# Patient Record
Sex: Male | Born: 1935 | Race: White | Hispanic: No | Marital: Married | Smoking: Former smoker
Health system: Southern US, Community
[De-identification: ages and names within clinical notes are randomized; demographics above are authoritative.]

## PROBLEM LIST (undated history)

## (undated) DIAGNOSIS — I471 Supraventricular tachycardia, unspecified: Secondary | ICD-10-CM

## (undated) DIAGNOSIS — G459 Transient cerebral ischemic attack, unspecified: Secondary | ICD-10-CM

## (undated) DIAGNOSIS — A419 Sepsis, unspecified organism: Secondary | ICD-10-CM

## (undated) DIAGNOSIS — N183 Chronic kidney disease, stage 3 unspecified: Secondary | ICD-10-CM

## (undated) DIAGNOSIS — I319 Disease of pericardium, unspecified: Secondary | ICD-10-CM

## (undated) DIAGNOSIS — C679 Malignant neoplasm of bladder, unspecified: Secondary | ICD-10-CM

## (undated) DIAGNOSIS — R262 Difficulty in walking, not elsewhere classified: Secondary | ICD-10-CM

## (undated) DIAGNOSIS — R0781 Pleurodynia: Secondary | ICD-10-CM

## (undated) DIAGNOSIS — R911 Solitary pulmonary nodule: Secondary | ICD-10-CM

## (undated) DIAGNOSIS — K5090 Crohn's disease, unspecified, without complications: Secondary | ICD-10-CM

## (undated) DIAGNOSIS — J42 Unspecified chronic bronchitis: Secondary | ICD-10-CM

## (undated) DIAGNOSIS — I714 Abdominal aortic aneurysm, without rupture, unspecified: Secondary | ICD-10-CM

## (undated) DIAGNOSIS — M1990 Unspecified osteoarthritis, unspecified site: Secondary | ICD-10-CM

## (undated) DIAGNOSIS — E781 Pure hyperglyceridemia: Secondary | ICD-10-CM

## (undated) DIAGNOSIS — R1310 Dysphagia, unspecified: Secondary | ICD-10-CM

## (undated) DIAGNOSIS — I10 Essential (primary) hypertension: Secondary | ICD-10-CM

## (undated) HISTORY — PX: COLON SURGERY: SHX602

## (undated) HISTORY — PX: APPENDECTOMY: SHX54

## (undated) HISTORY — PX: ABDOMINAL AORTIC ANEURYSM REPAIR: SUR1152

## (undated) HISTORY — PX: CHOLECYSTECTOMY: SHX55

## (undated) HISTORY — PX: RENAL CYST EXCISION: SUR506

## (undated) HISTORY — DX: Disease of pericardium, unspecified: I31.9

## (undated) HISTORY — PX: COCHLEAR IMPLANT: SUR684

## (undated) HISTORY — PX: OTHER SURGICAL HISTORY: SHX169

## (undated) HISTORY — PX: INGUINAL HERNIA REPAIR: SUR1180

---

## 2013-04-13 ENCOUNTER — Emergency Department (HOSPITAL_COMMUNITY): Payer: Medicare Other

## 2013-04-13 ENCOUNTER — Inpatient Hospital Stay (HOSPITAL_COMMUNITY)
Admission: EM | Admit: 2013-04-13 | Discharge: 2013-04-14 | DRG: 287 | Disposition: A | Discharge status: Home / Self Care | Payer: Medicare Other | Attending: Cardiology | Admitting: Cardiology

## 2013-04-13 ENCOUNTER — Encounter (HOSPITAL_COMMUNITY): Payer: Self-pay | Admitting: Emergency Medicine

## 2013-04-13 ENCOUNTER — Encounter (HOSPITAL_COMMUNITY)
Admission: EM | Disposition: A | Discharge status: Home / Self Care | Payer: Self-pay | Source: Home / Self Care | Attending: Cardiology

## 2013-04-13 DIAGNOSIS — I319 Disease of pericardium, unspecified: Secondary | ICD-10-CM

## 2013-04-13 DIAGNOSIS — R0989 Other specified symptoms and signs involving the circulatory and respiratory systems: Secondary | ICD-10-CM | POA: Diagnosis present

## 2013-04-13 DIAGNOSIS — Z8673 Personal history of transient ischemic attack (TIA), and cerebral infarction without residual deficits: Secondary | ICD-10-CM

## 2013-04-13 DIAGNOSIS — I309 Acute pericarditis, unspecified: Principal | ICD-10-CM | POA: Insufficient documentation

## 2013-04-13 DIAGNOSIS — I2510 Atherosclerotic heart disease of native coronary artery without angina pectoris: Secondary | ICD-10-CM | POA: Diagnosis present

## 2013-04-13 DIAGNOSIS — E781 Pure hyperglyceridemia: Secondary | ICD-10-CM

## 2013-04-13 DIAGNOSIS — R0781 Pleurodynia: Secondary | ICD-10-CM | POA: Diagnosis present

## 2013-04-13 DIAGNOSIS — K5090 Crohn's disease, unspecified, without complications: Secondary | ICD-10-CM | POA: Diagnosis present

## 2013-04-13 DIAGNOSIS — Z8551 Personal history of malignant neoplasm of bladder: Secondary | ICD-10-CM

## 2013-04-13 DIAGNOSIS — I300 Acute nonspecific idiopathic pericarditis: Secondary | ICD-10-CM

## 2013-04-13 DIAGNOSIS — I10 Essential (primary) hypertension: Secondary | ICD-10-CM | POA: Diagnosis present

## 2013-04-13 DIAGNOSIS — E785 Hyperlipidemia, unspecified: Secondary | ICD-10-CM | POA: Diagnosis present

## 2013-04-13 DIAGNOSIS — I498 Other specified cardiac arrhythmias: Secondary | ICD-10-CM | POA: Diagnosis present

## 2013-04-13 DIAGNOSIS — Z9889 Other specified postprocedural states: Secondary | ICD-10-CM

## 2013-04-13 DIAGNOSIS — F411 Generalized anxiety disorder: Secondary | ICD-10-CM | POA: Diagnosis present

## 2013-04-13 DIAGNOSIS — R0609 Other forms of dyspnea: Secondary | ICD-10-CM | POA: Diagnosis present

## 2013-04-13 DIAGNOSIS — R079 Chest pain, unspecified: Secondary | ICD-10-CM

## 2013-04-13 HISTORY — DX: Abdominal aortic aneurysm, without rupture, unspecified: I71.40

## 2013-04-13 HISTORY — DX: Transient cerebral ischemic attack, unspecified: G45.9

## 2013-04-13 HISTORY — PX: LEFT HEART CATHETERIZATION WITH CORONARY ANGIOGRAM: SHX5451

## 2013-04-13 HISTORY — DX: Crohn's disease, unspecified, without complications: K50.90

## 2013-04-13 HISTORY — DX: Sepsis, unspecified organism: A41.9

## 2013-04-13 HISTORY — DX: Essential (primary) hypertension: I10

## 2013-04-13 HISTORY — DX: Malignant neoplasm of bladder, unspecified: C67.9

## 2013-04-13 HISTORY — DX: Supraventricular tachycardia: I47.1

## 2013-04-13 HISTORY — DX: Pleurodynia: R07.81

## 2013-04-13 HISTORY — DX: Supraventricular tachycardia, unspecified: I47.10

## 2013-04-13 HISTORY — DX: Disease of pericardium, unspecified: I31.9

## 2013-04-13 HISTORY — DX: Abdominal aortic aneurysm, without rupture: I71.4

## 2013-04-13 HISTORY — DX: Pure hyperglyceridemia: E78.1

## 2013-04-13 HISTORY — DX: Unspecified osteoarthritis, unspecified site: M19.90

## 2013-04-13 LAB — PROTIME-INR
INR: 0.94 (ref 0.00–1.49)
Prothrombin Time: 12.4 seconds (ref 11.6–15.2)

## 2013-04-13 LAB — COMPREHENSIVE METABOLIC PANEL
ALT: 36 U/L (ref 0–53)
AST: 31 U/L (ref 0–37)
Albumin: 3.4 g/dL — ABNORMAL LOW (ref 3.5–5.2)
Alkaline Phosphatase: 58 U/L (ref 39–117)
Calcium: 8.7 mg/dL (ref 8.4–10.5)
GFR calc Af Amer: 54 mL/min — ABNORMAL LOW (ref >90)
GFR calc non Af Amer: 47 mL/min — ABNORMAL LOW (ref >90)
Glucose, Bld: 106 mg/dL — ABNORMAL HIGH (ref 70–99)
Potassium: 4.3 mEq/L (ref 3.5–5.1)
Sodium: 137 mEq/L (ref 135–145)
Total Protein: 6.8 g/dL (ref 6.0–8.3)

## 2013-04-13 LAB — APTT: aPTT: 28 seconds (ref 24–37)

## 2013-04-13 LAB — CBC WITH DIFFERENTIAL/PLATELET
Basophils Absolute: 0 10*3/uL (ref 0.0–0.1)
Basophils Relative: 0 % (ref 0–1)
Eosinophils Absolute: 0.3 10*3/uL (ref 0.0–0.7)
Eosinophils Relative: 4 % (ref 0–5)
Hemoglobin: 14.6 g/dL (ref 13.0–17.0)
Lymphs Abs: 2.1 10*3/uL (ref 0.7–4.0)
MCH: 29.1 pg (ref 26.0–34.0)
MCHC: 33.6 g/dL (ref 30.0–36.0)
MCV: 86.5 fL (ref 78.0–100.0)
Neutrophils Relative %: 55 % (ref 43–77)
Platelets: 121 10*3/uL — ABNORMAL LOW (ref 150–400)
RBC: 5.02 MIL/uL (ref 4.22–5.81)
RDW: 14 % (ref 11.5–15.5)

## 2013-04-13 LAB — CREATININE, SERUM
GFR calc Af Amer: 54 mL/min — ABNORMAL LOW (ref >90)
GFR calc non Af Amer: 46 mL/min — ABNORMAL LOW (ref >90)

## 2013-04-13 LAB — CBC
HCT: 40.9 % (ref 39.0–52.0)
Hemoglobin: 13.8 g/dL (ref 13.0–17.0)
MCH: 29.2 pg (ref 26.0–34.0)
RDW: 13.8 % (ref 11.5–15.5)
WBC: 6 10*3/uL (ref 4.0–10.5)

## 2013-04-13 LAB — TROPONIN I
Troponin I: <0.3 ng/mL (ref <0.30)
Troponin I: <0.3 ng/mL (ref <0.30)
Troponin I: <0.3 ng/mL (ref <0.30)

## 2013-04-13 SURGERY — LEFT HEART CATHETERIZATION WITH CORONARY ANGIOGRAM
Anesthesia: LOCAL | Laterality: Bilateral

## 2013-04-13 MED ORDER — HEPARIN (PORCINE) IN NACL 2-0.9 UNIT/ML-% IJ SOLN
INTRAMUSCULAR | Status: AC
  Filled 2013-04-13: qty 1000

## 2013-04-13 MED ORDER — SODIUM CHLORIDE 0.9 % IV SOLN: INTRAVENOUS | Status: AC

## 2013-04-13 MED ORDER — SODIUM CHLORIDE 0.9 % IJ SOLN
3.0000 mL | Freq: Two times a day (BID) | INTRAMUSCULAR | Status: DC
  Administered 2013-04-13 – 2013-04-14 (×2): 3 mL via INTRAVENOUS

## 2013-04-13 MED ORDER — COLCHICINE 0.6 MG PO TABS
0.6000 mg | ORAL_TABLET | Freq: Two times a day (BID) | ORAL | Status: DC
  Administered 2013-04-13 – 2013-04-14 (×2): 0.6 mg via ORAL
  Filled 2013-04-13 (×3): qty 1

## 2013-04-13 MED ORDER — ATORVASTATIN CALCIUM 40 MG PO TABS
40.0000 mg | ORAL_TABLET | Freq: Every day | ORAL | Status: DC
  Administered 2013-04-13: 40 mg via ORAL
  Filled 2013-04-13 (×2): qty 1

## 2013-04-13 MED ORDER — DIPHENOXYLATE-ATROPINE 2.5-0.025 MG PO TABS: 2.0000 | ORAL_TABLET | Freq: Two times a day (BID) | ORAL | Status: DC | PRN

## 2013-04-13 MED ORDER — LIDOCAINE HCL (PF) 1 % IJ SOLN
INTRAMUSCULAR | Status: AC
  Filled 2013-04-13: qty 30

## 2013-04-13 MED ORDER — IOHEXOL 350 MG/ML SOLN
100.0000 mL | Freq: Once | INTRAVENOUS | Status: AC | PRN
  Administered 2013-04-13: 100 mL via INTRAVENOUS

## 2013-04-13 MED ORDER — ONDANSETRON HCL 4 MG/2ML IJ SOLN: 4.0000 mg | Freq: Four times a day (QID) | INTRAMUSCULAR | Status: DC | PRN

## 2013-04-13 MED ORDER — RISAQUAD PO CAPS
1.0000 | ORAL_CAPSULE | Freq: Two times a day (BID) | ORAL | Status: DC
  Administered 2013-04-13 – 2013-04-14 (×3): 1 via ORAL
  Filled 2013-04-13 (×4): qty 1

## 2013-04-13 MED ORDER — MORPHINE SULFATE 2 MG/ML IJ SOLN
2.0000 mg | Freq: Once | INTRAMUSCULAR | Status: DC
  Filled 2013-04-13: qty 1

## 2013-04-13 MED ORDER — SODIUM CHLORIDE 0.9 % IV SOLN: 250.0000 mL | INTRAVENOUS | Status: DC | PRN

## 2013-04-13 MED ORDER — ASPIRIN 81 MG PO CHEW
81.0000 mg | CHEWABLE_TABLET | Freq: Every day | ORAL | Status: DC
  Administered 2013-04-13 – 2013-04-14 (×2): 81 mg via ORAL
  Filled 2013-04-13: qty 1

## 2013-04-13 MED ORDER — HEPARIN SODIUM (PORCINE) 1000 UNIT/ML IJ SOLN
INTRAMUSCULAR | Status: AC
  Filled 2013-04-13: qty 1

## 2013-04-13 MED ORDER — HEPARIN SODIUM (PORCINE) 5000 UNIT/ML IJ SOLN
4000.0000 [IU] | Freq: Once | INTRAMUSCULAR | Status: AC
  Administered 2013-04-13: 4000 [IU] via INTRAVENOUS
  Filled 2013-04-13: qty 1

## 2013-04-13 MED ORDER — NITROGLYCERIN 0.4 MG SL SUBL: 0.4000 mg | SUBLINGUAL_TABLET | SUBLINGUAL | Status: DC | PRN

## 2013-04-13 MED ORDER — NITROGLYCERIN 2 % TD OINT
1.0000 [in_us] | TOPICAL_OINTMENT | Freq: Once | TRANSDERMAL | Status: AC
  Administered 2013-04-13: 1 [in_us] via TOPICAL
  Filled 2013-04-13: qty 1

## 2013-04-13 MED ORDER — ACETAMINOPHEN 325 MG PO TABS
650.0000 mg | ORAL_TABLET | ORAL | Status: DC | PRN
  Administered 2013-04-13: 650 mg via ORAL

## 2013-04-13 MED ORDER — METOPROLOL SUCCINATE ER 100 MG PO TB24
100.0000 mg | ORAL_TABLET | Freq: Two times a day (BID) | ORAL | Status: DC
  Administered 2013-04-13 – 2013-04-14 (×3): 100 mg via ORAL
  Filled 2013-04-13 (×4): qty 1

## 2013-04-13 MED ORDER — MORPHINE SULFATE 2 MG/ML IJ SOLN
INTRAMUSCULAR | Status: AC
  Filled 2013-04-13: qty 1

## 2013-04-13 MED ORDER — HEPARIN SODIUM (PORCINE) 5000 UNIT/ML IJ SOLN
5000.0000 [IU] | Freq: Three times a day (TID) | INTRAMUSCULAR | Status: DC
  Administered 2013-04-13 – 2013-04-14 (×3): 5000 [IU] via SUBCUTANEOUS
  Filled 2013-04-13 (×6): qty 1

## 2013-04-13 MED ORDER — METOPROLOL TARTRATE 25 MG PO TABS
25.0000 mg | ORAL_TABLET | Freq: Two times a day (BID) | ORAL | Status: DC
  Filled 2013-04-13 (×2): qty 1

## 2013-04-13 MED ORDER — HEPARIN SODIUM (PORCINE) 5000 UNIT/ML IJ SOLN: 5000.0000 [IU] | Freq: Three times a day (TID) | INTRAMUSCULAR | Status: DC

## 2013-04-13 MED ORDER — HYDROCODONE-ACETAMINOPHEN 5-325 MG PO TABS
1.0000 | ORAL_TABLET | Freq: Four times a day (QID) | ORAL | Status: DC
  Administered 2013-04-13 – 2013-04-14 (×3): 1 via ORAL
  Filled 2013-04-13 (×3): qty 1

## 2013-04-13 MED ORDER — ACETAMINOPHEN 325 MG PO TABS
650.0000 mg | ORAL_TABLET | ORAL | Status: DC | PRN
  Filled 2013-04-13: qty 2

## 2013-04-13 MED ORDER — SODIUM CHLORIDE 0.9 % IJ SOLN
10.0000 mL | INTRAMUSCULAR | Status: DC | PRN
  Administered 2013-04-13: 20 mL
  Administered 2013-04-14: 10 mL

## 2013-04-13 MED ORDER — HYDROMORPHONE HCL PF 1 MG/ML IJ SOLN
0.5000 mg | Freq: Once | INTRAMUSCULAR | Status: AC
  Administered 2013-04-13: 0.5 mg via INTRAVENOUS
  Filled 2013-04-13: qty 1

## 2013-04-13 MED ORDER — TRAZODONE HCL 100 MG PO TABS
100.0000 mg | ORAL_TABLET | Freq: Every evening | ORAL | Status: DC | PRN
  Filled 2013-04-13: qty 2

## 2013-04-13 MED ORDER — CULTURELLE PO CAPS: 1.0000 | ORAL_CAPSULE | Freq: Two times a day (BID) | ORAL | Status: DC

## 2013-04-13 MED ORDER — VITAMIN B-12 1000 MCG PO TABS
1000.0000 ug | ORAL_TABLET | Freq: Every day | ORAL | Status: DC
  Administered 2013-04-13 – 2013-04-14 (×2): 1000 ug via ORAL
  Filled 2013-04-13 (×3): qty 1

## 2013-04-13 MED ORDER — NITROGLYCERIN 0.2 MG/ML ON CALL CATH LAB
INTRAVENOUS | Status: AC
  Filled 2013-04-13: qty 1

## 2013-04-13 MED ORDER — SODIUM CHLORIDE 0.9 % IJ SOLN: 3.0000 mL | INTRAMUSCULAR | Status: DC | PRN

## 2013-04-13 MED ORDER — COLCHICINE 0.6 MG PO TABS
0.6000 mg | ORAL_TABLET | Freq: Once | ORAL | Status: AC
  Administered 2013-04-13: 0.6 mg via ORAL
  Filled 2013-04-13: qty 1

## 2013-04-13 NOTE — Progress Notes (Signed)
TR band removed, no bleeding note. Allen test performed and was WNL. VS are stable Will continue to monitor. Raynelle Bring

## 2013-04-13 NOTE — ED Notes (Signed)
IV team paged and returned call- stated, "we will be there as soon as possible." Family and EDP made aware.

## 2013-04-13 NOTE — Progress Notes (Signed)
  Echocardiogram 2D Echocardiogram has been performed.  Cathie Beams 04/13/2013, 12:55 PM

## 2013-04-13 NOTE — ED Notes (Signed)
Gold colored necklace with 3 pendants on it given to daughter Sherrilyn Rist

## 2013-04-13 NOTE — ED Notes (Signed)
Cardiologist at bedside. Code STEMI called.

## 2013-04-13 NOTE — Progress Notes (Signed)
Chaplain responded to code stemi by visiting with pt's wife in cath lab waiting area. Chaplain offered support through caring conversation and empathic listening.  Pt's wife expressed thanks for chaplain's care and support.   04/13/13 0700  Clinical Encounter Type  Visited With Family;Patient not available  Visit Type Spiritual support    Rulon Abide, chaplain, page [phone]

## 2013-04-13 NOTE — ED Notes (Signed)
EMS called to home of Pt. Pt. Awake from sleep with c/o of non radiating cp, c/o nausea denies vomiting. + SOB. Skin warm and dry. Pt. Given ASA 324mg . Nitro. X 2 SL. With minor relief of CP.Rates CP 7/10 after nitro.

## 2013-04-13 NOTE — ED Notes (Signed)
Patient being transported to cath lab by this RN and a 2nd Charity fundraiser. Pt remains on zoll.

## 2013-04-13 NOTE — CV Procedure (Signed)
    Cardiac Catheterization Procedure Note  Name: [place] MRN: [id number] DOB: 22-Feb-1936  Procedure: Left Heart Cath, Selective Coronary Angiography, LV angiography  Indication: 77 yo WM with history of HTN, hyperlipidemia and PAD presents with acute onset dyspnea and anterior pleuritic chest pain. Initial troponin was negative. D-dimer was elevated but CTA chest showed no evidence of PE. Serial Ecgs show subtle ST elevation in the inferior leads. Urgent cardiac cath was recommended.   Procedural Details: The right wrist was prepped, draped, and anesthetized with 1% lidocaine. Using the modified Seldinger technique, a 5 French sheath was introduced into the right radial artery. 3 mg of verapamil was administered through the sheath, weight-based unfractionated heparin was administered intravenously. Standard Judkins catheters were used for selective coronary angiography and left ventriculography. An EZRAD right catheter was used for the RCA.Catheter exchanges were performed over an exchange length guidewire. There were no immediate procedural complications. A TR band was used for radial hemostasis at the completion of the procedure.  The patient was transferred to the post catheterization recovery area for further monitoring.  Procedural Findings: Hemodynamics: AO 151/82 mean 111 mm Hg LV 150/24 mm Hg  Coronary angiography: Coronary dominance: right  Left mainstem: Normal  Left anterior descending (LAD): Heavily calcified in the mid vessel. There is diffuse 40% disease in the mid LAD. The first diagonal is normal.   Left circumflex (LCx):  The LCx gives rise to 2 OM branches. The first OM is large with mild disease up to 20%. The second OM is small with 40% disease in the mid vessel.   Right coronary artery (RCA): Dominant. Tortuous. Diffuse disease in the proximal to mid vessel up to 40%. 20% disease distally.  Left ventriculography: Left ventricular systolic function is normal,  LVEF is estimated at 55-65%, there is no significant mitral regurgitation   Final Conclusions:   1. Nonobstructive CAD 2. Normal LV function.  Recommendations: Admit for further management of chest pain. Will obtain an Echo.  Theron Arista [facility] 04/13/2013, 7:27 AM

## 2013-04-13 NOTE — ED Provider Notes (Signed)
CSN: [id number]     Arrival date & time 04/13/13  5784 History   First MD Initiated Contact with Patient 04/13/13 0257     Chief Complaint  Patient presents with  . Chest Pain   (Consider location/radiation/quality/duration/timing/severity/associated sxs/prior Treatment) HPI Patient presents with acute onset left-sided chest pain, shortness of breath awakened him from sleep. The pain is worse with deep breathing. Took 324 mg of aspirin prior to EMS arrival. When EMS arrived patient had a heart rate in the 140s. Was given 2 nitroglycerin with some relief of the patient's symptoms. Patient denies lower extremity swelling or pain. He's had no recent illnesses. States he wants in his normal state of health when he went to bed. Past Medical History  Diagnosis Date  . Crohn disease   . Anxiety   . Hypertension    Past Surgical History  Procedure Laterality Date  . Colon surgery     No family history on file. History  Substance Use Topics  . Smoking status: Never Smoker   . Smokeless tobacco: Not on file  . Alcohol Use: No    Review of Systems  Constitutional: Negative for fever and chills.  Respiratory: Positive for shortness of breath. Negative for cough and wheezing.   Cardiovascular: Positive for chest pain. Negative for palpitations and leg swelling.  Gastrointestinal: Negative for vomiting, abdominal pain, diarrhea and constipation.  Musculoskeletal: Negative for back pain.  Neurological: Negative for dizziness, syncope, weakness, light-headedness, numbness and headaches.  All other systems reviewed and are negative.    Allergies  Review of patient's allergies indicates not on file.  Home Medications  No current outpatient prescriptions on file. BP 114/79  Pulse 82  Temp(Src) 98.5 F (36.9 C) (Oral)  Resp 14  SpO2 94% Physical Exam  Nursing note and vitals reviewed. Constitutional: He is oriented to person, place, and time. He appears well-developed and  well-nourished. No distress.  HENT:  Head: Normocephalic and atraumatic.  Mouth/Throat: Oropharynx is clear and moist.  Eyes: EOM are normal. Pupils are equal, round, and reactive to light.  Neck: Normal range of motion. Neck supple.  Cardiovascular: Normal rate and regular rhythm.   Pulmonary/Chest: Effort normal. No respiratory distress. He has no wheezes. He has rales. He exhibits tenderness (chest tenderness to palpation in the left upper chest. There is no crepitance or deformity.).  Decreased breath sounds bilateral bases with scattered rhonchi.  Abdominal: Soft. Bowel sounds are normal. He exhibits no distension and no mass. There is no tenderness. There is no rebound and no guarding.  Musculoskeletal: Normal range of motion. He exhibits no edema and no tenderness.  No calf swelling or tenderness  Neurological: He is alert and oriented to person, place, and time.  Skin: Skin is warm and dry. No rash noted. No erythema.  Psychiatric: He has a normal mood and affect. His behavior is normal.    ED Course  Procedures (including critical care time) Labs Review Labs Reviewed  CBC WITH DIFFERENTIAL  COMPREHENSIVE METABOLIC PANEL  PRO B NATRIURETIC PEPTIDE  D-DIMER, QUANTITATIVE  TROPONIN I   Imaging Review No results found.  EKG Interpretation    Date/Time:  Thursday April 13 2013 03:09:26 EST Ventricular Rate:  83 PR Interval:  170 QRS Duration: 88 QT Interval:  363 QTC Calculation: 426 R Axis:   62 Text Interpretation:  Sinus rhythm Abnormal R-wave progression, early transition Confirmed by Josephanthony Tindel  MD, Ramonica Grigg ([id number]) on 04/13/2013 6:10:50 AM  CRITICAL CARE Performed by: Ranae Palms, Chesnee Floren Total critical care time: 60 min Critical care time was exclusive of separately billable procedures and treating other patients. Critical care was necessary to treat or prevent imminent or life-threatening deterioration. Critical care was time spent personally by me on  the following activities: development of treatment plan with patient and/or surrogate as well as nursing, discussions with consultants, evaluation of patient's response to treatment, examination of patient, obtaining history from patient or surrogate, ordering and performing treatments and interventions, ordering and review of laboratory studies, ordering and review of radiographic studies, pulse oximetry and re-evaluation of patient's condition.   MDM  Initial EMS run sheet shows patient to be in a regular narrow complex tachycardia roughly 150-140 beats per minute. It then converts to normal sinus rhythm. Initial EKG at 0309 with mild upsloping of inferior ST segments without definite elevation. No reciprocal changes.  Called and the patient room for increasing numbness and weakness to bilateral upper extremities. He states he is continuing to have chest pain which is much worse when he takes a deep breath in. On the differential is possible dissection versus PE versus coronary artery disease. Patient does have elevated d-dimer and some noted left calf swelling more so than right calf swelling. No bruits in the neck. He has equal pulses bilaterally. Patient is moving all extremities and doesn't appear to have any facial droop. I discussed with Dr. Cherly Hensen about the most appropriate test at this point. I believe the patient likely has a PE and we'll go ahead and get a CT angios study for PE. We'll also get a CT of the patient's head. We will continue to monitor the patient very closely.  CT without acute findings as to cause for patient's symptoms. A repeat EKG with slightly more prominent ST upsloping and mild elevation with questionable changes to AVL. Patient continues to have mild left upper chest pain worse when he takes a deep breath. I discussed with Dr. [place]. Iam awaiting his review of the EKG.  Dr. Sw[place]and reviewed EKG. Concerned about the changes in the last EKG in AVL and slightly more prominent  ST segments in inferior leads. Thinks that we should go ahead and call a STEMI activation. I informed the charge nurse and secretary to activate cath lab.     Loren Racer, MD 04/13/13 [phone]

## 2013-04-13 NOTE — ED Notes (Signed)
Patient remains A&Ox4 at this time. O2 sat 96% on 2L via [place]. Patient states that he continues to have mid chest "pressure" that does not radiate.  All patient belongings were given to patient daughter-Kari.  Patient placed on Zoll- sinus rhythm on monitor.

## 2013-04-13 NOTE — Progress Notes (Signed)
The Chaplain offered emotional and spiritual support to the patient today. The patient expressed to the Chaplain his thankfulness and gratitude to God for sparing his life for so many years while living [place]. The patient told the Chaplain stories on how he grew up and how the importance of family played a big part in his life and still does. He also expressed to the Chaplain that he has a good life and will continue to have one no matter what his physical condition may be in the hospital.  Michael Boston Lavaris Sexson

## 2013-04-13 NOTE — ED Notes (Signed)
Phlebotomy at bedside.

## 2013-04-13 NOTE — ED Notes (Signed)
MD at bedside. 

## 2013-04-13 NOTE — H&P (Signed)
Patient ID: [place] MRN: [id number], DOB/AGE: 11/02/1935   Admit date: 04/13/2013  Primary Physician: Pcp Not In System Primary Cardiologist: new to  - seen by P. [place], MD   Pt. Profile:  77 y/o male w/o prior cardiac hx who presented to the ED this AM with dyspnea, nausea, and pleuritic chest pain with ST elevation.  Problem List  Past Medical History  Diagnosis Date  . Crohn disease     a. s/p multiple bowel resections;  b. s/p portacath.  Marland Kitchen HTN (hypertension)   . Hyperlipidemia   . AAA (abdominal aortic aneurysm)     a. s/p stent grafting in 2012, in [place], [place], per pt.  Marland Kitchen TIA (transient ischemic attack)     a. x 2, last ~ 2002  . Chest pain     a. s/p reportedly nl cath in 2012, [place], [place]  . Bladder cancer     Past Surgical History  Procedure Laterality Date  . Colon surgery      a. multiple bowel resections 2/2 chrohn's dzs.  . Abdominal aortic aneurysm repair      a. reported stenting 2012  . Appendectomy    . Renal cyst excision    . Inguinal hernia repair    . Spinal fusion    . Cochlear implant    . Left shoulder surgery      Allergies  Allergies  Allergen Reactions  . Demerol [Meperidine]   . Morphine And Related    HPI  77 y/o male with the above complex past medical history.  He reports a h/o chest pain about 2 years ago while living in [place], [place], at which time he underwent catheterization that he believes did not show any significant coronary disease.  About 2 wks later, he was readmitted with abdominal pain and after multiple CT's, he was transferred to a larger hospital and underwent what sounds like AAA repair/stent grafting.  He moved to [place] relatively recently.  He was in his usoh until this AM at approximately 2, when he awoke with dyspnea, nausea, disorientation and chest pain that was worse upon taking a deep breath.  He [facility] ED where initial ECG showed subtle inflat ST elevation.  Initial troponin  was nl and d-dimer returned @ 2.39.  Head CT showed no acute findings, while CTA of the chest was neg for PE.  Dyspnea resolved however he continued to have 7/10 pleuritic chest pain.  F/u ecg showed some progression of inflat ST elevation at which point a code STEMI was called.  Pt currently complains of 7/10 pleuritic chest pain but is in no acute distress.  Home Medications  Complete home med list is not available at this time, however he says that he is on blood pressure medicine, cholesterol medicine, and medicines for his crohns.  Family History  Family History  Problem Relation Age of Onset  . CAD Father     died @ 41  . CAD Mother     died @ 16   Social History  History   Social History  . Marital Status: Married    Spouse Name: N/A    Number of Children: N/A  . Years of Education: N/A   Occupational History  . Not on file.   Social History Main Topics  . Smoking status: Never Smoker   . Smokeless tobacco: Not on file  . Alcohol Use: No  . Drug Use: No  . Sexual  Activity: Not on file   Other Topics Concern  . Not on file   Social History Narrative   Lives in [place].  Does not routinely exercise.    Review of Systems General:  No chills, fever, night sweats or weight changes. HOH Cardiovascular:  +++ chest pain, +++ dyspnea on exertion, no edema, orthopnea, palpitations, paroxysmal nocturnal dyspnea. Dermatological: No rash, lesions/masses Respiratory: No cough, +++ dyspnea Urologic: No hematuria, dysuria Abdominal:   No nausea, vomiting, diarrhea, bright red blood per rectum, melena, or hematemesis Neurologic:  No visual changes, wkns, changes in mental status. All other systems reviewed and are otherwise negative except as noted above.  Physical Exam  Blood pressure 122/73, pulse 60, temperature 98.5 F (36.9 C), temperature source Oral, resp. rate 12, SpO2 95.00%.  General: Pleasant, NAD Psych: Normal affect. Neuro: Alert and oriented X 3. Moves  all extremities spontaneously. HEENT: Normal  Neck: Supple without bruits or JVD. Lungs:  Resp regular and unlabored, CTA. Heart: RRR no s3, s4, or murmurs. Abdomen: Soft, non-tender, non-distended, BS + x 4.  Extremities: No clubbing, cyanosis or edema. DP/PT/Radials 2+ and equal bilaterally.  Labs   Recent Labs  04/13/13 0327  TROPONINI <0.30   Lab Results  Component Value Date   WBC 7.0 04/13/2013   HGB 14.6 04/13/2013   HCT 43.4 04/13/2013   MCV 86.5 04/13/2013   PLT 121* 04/13/2013     Recent Labs Lab 04/13/13 0327  NA 137  K 4.3  CL 102  CO2 22  BUN 25*  CREATININE 1.42*  CALCIUM 8.7  PROT 6.8  BILITOT 0.3  ALKPHOS 58  ALT 36  AST 31  GLUCOSE 106*   Lab Results  Component Value Date   DDIMER 2.39* 04/13/2013    Radiology/Studies  Ct Head Wo Contrast  04/13/2013   CLINICAL DATA:  Nausea  EXAM: CT HEAD WITHOUT CONTRAST  IMPRESSION: 1. No acute intracranial process. 2. Remote lacunar infarct within the left basal ganglia. 3. Right cochlear implant.   Electronically Signed   By: Rise Mu M.D.   On: 04/13/2013 06:05   Ct Angio Chest Pe W/cm &/or Wo Cm  04/13/2013   CLINICAL DATA:  Shortness of breath and elevated D-dimer. Nonradiating chest pain and nausea.  EXAM: CT ANGIOGRAPHY CHEST WITH CONTRAST  IMPRESSION: 1. No evidence of pulmonary embolus. 2. Mild bibasilar atelectasis noted; lungs otherwise clear. 3. Focal aneurysmal dilatation of the proximal descending thoracic aorta to 4.0 cm in diameter.   Electronically Signed   By: Roanna Raider M.D.   On: 04/13/2013 06:10   Dg Chest Port 1 View  04/13/2013   CLINICAL DATA:  Chest pain and shortness of breath.  EXAM: PORTABLE CHEST - 1 VIEW    IMPRESSION: Mild left basilar airspace opacity may reflect atelectasis or possibly mild pneumonia.   Electronically Signed   By: Roanna Raider M.D.   On: 04/13/2013 03:47   ECG  Rsr, 61, 1-1.60mm inflat ST elevation.  ASSESSMENT AND PLAN  1.  Chest  Pain with inferolateral ST elevation/Possible acute pericarditis:  Pt presented with pleuritic chest pain and dyspnea with inferolateral ST elevation.  Initial troponin normal, D-dimer elevated however CTA of chest negative for PE.  Cath shows nonobstructive CAD.  Suspect pericarditis.  Will admit to stepdown, add asa and colchicine.  Avoid nsaids with crohn's history.  Cycle CE.  Check Echo.  Add statin given diffuse plaque.  2.  HTN: currently stable.  Will review home meds  and add.  3. HL:  Check lipids/lft's.  Says he was on [place] @ home.  Will add statin given diffuse coronary plaque.  4.  Crohn's dzs:  Resume home meds.  Signed, Nicolasa Ducking, NP 04/13/2013, 7:01 AM Patient seen and examined and history reviewed. Agree with above findings and plan. 77 yo WM with history noted above. Presents with acute onset dyspnea and anterior pleuritic chest pain. D-dimer elevated but CTA of chest negative for PE. Serial Ecgs show subtle ST elevation inferiorly. Urgent cardiac cath performed and showed no significant obstructive CAD. Will admit. Start colchicine for possible pericarditis. Check Echo. I am reluctant to start NSAIDs due to advanced Chron's disease.   Theron Arista [facility] 04/13/2013 11:39 AM

## 2013-04-14 ENCOUNTER — Telehealth: Payer: Self-pay | Admitting: Nurse Practitioner

## 2013-04-14 ENCOUNTER — Encounter (HOSPITAL_COMMUNITY): Payer: Self-pay | Admitting: Nurse Practitioner

## 2013-04-14 DIAGNOSIS — R0781 Pleurodynia: Secondary | ICD-10-CM | POA: Diagnosis present

## 2013-04-14 DIAGNOSIS — I319 Disease of pericardium, unspecified: Secondary | ICD-10-CM

## 2013-04-14 DIAGNOSIS — Z9889 Other specified postprocedural states: Secondary | ICD-10-CM

## 2013-04-14 DIAGNOSIS — Z8673 Personal history of transient ischemic attack (TIA), and cerebral infarction without residual deficits: Secondary | ICD-10-CM

## 2013-04-14 DIAGNOSIS — I10 Essential (primary) hypertension: Secondary | ICD-10-CM | POA: Diagnosis present

## 2013-04-14 DIAGNOSIS — E781 Pure hyperglyceridemia: Secondary | ICD-10-CM

## 2013-04-14 DIAGNOSIS — K5090 Crohn's disease, unspecified, without complications: Secondary | ICD-10-CM | POA: Diagnosis present

## 2013-04-14 LAB — LIPID PANEL
LDL Cholesterol: 51 mg/dL (ref 0–99)
Triglycerides: 400 mg/dL — ABNORMAL HIGH (ref <150)
VLDL: 80 mg/dL — ABNORMAL HIGH (ref 0–40)

## 2013-04-14 MED ORDER — ASPIRIN 81 MG PO CHEW: 81.0000 mg | CHEWABLE_TABLET | Freq: Every day | ORAL | Status: DC

## 2013-04-14 MED ORDER — HEPARIN SOD (PORK) LOCK FLUSH 100 UNIT/ML IV SOLN
500.0000 [IU] | INTRAVENOUS | Status: AC | PRN
  Administered 2013-04-14: 500 [IU]

## 2013-04-14 MED ORDER — COLCHICINE 0.6 MG PO TABS: 0.6000 mg | ORAL_TABLET | Freq: Two times a day (BID) | ORAL | Status: DC

## 2013-04-14 MED ORDER — NITROGLYCERIN 0.4 MG SL SUBL: 0.4000 mg | SUBLINGUAL_TABLET | SUBLINGUAL | Status: AC | PRN

## 2013-04-14 NOTE — Progress Notes (Signed)
CARDIAC REHAB PHASE I   PRE:  Rate/Rhythm: 65 SR    BP: sitting 133/73    SaO2: 95 RA  MODE:  Ambulation: 250 ft   POST:  Rate/Rhythm: 69     BP: sitting 120/92     SaO2: 97 RA  Pt ambulated assist x1 with RW. Has many issues with his legs and has trouble walking. Fairly steady with RW. Pt denied CP or SOB. Feels good. Sts it felt good to walk.  [id number]   Elissa Lovett [place] CES, ACSM 04/14/2013 10:05 AM

## 2013-04-14 NOTE — Progress Notes (Signed)
TELEMETRY: Reviewed telemetry pt in NSR: Filed Vitals:   04/13/13 1259 04/13/13 1356 04/13/13 2100 04/14/13 0500  BP: 144/88 123/58 125/100 147/74  Pulse: 60 64 64 65  Temp:  98.2 F (36.8 C) 98.2 F (36.8 C) 97.7 F (36.5 C)  TempSrc:  Oral Oral Oral  Resp:  16    Height:  5\' 8"  (1.727 m)    Weight:   229 lb 1.6 oz (103.919 kg)   SpO2:  97% 95% 99%    Intake/Output Summary (Last 24 hours) at 04/14/13 0906 Last data filed at 04/14/13 0610  Gross per 24 hour  Intake    840 ml  Output   1200 ml  Net   -360 ml    SUBJECTIVE Feels well today. Still a little sore but pleuritic pain resolved.   LABS: Basic Metabolic Panel:  Recent Labs  16/10/96 0327 04/13/13 1040  NA 137  --   K 4.3  --   CL 102  --   CO2 22  --   GLUCOSE 106*  --   BUN 25*  --   CREATININE 1.42* 1.43*  CALCIUM 8.7  --    Liver Function Tests:  Recent Labs  04/13/13 0327  AST 31  ALT 36  ALKPHOS 58  BILITOT 0.3  PROT 6.8  ALBUMIN 3.4*   No results found for this basename: LIPASE, AMYLASE,  in the last 72 hours CBC:  Recent Labs  04/13/13 0327 04/13/13 1040  WBC 7.0 6.0  NEUTROABS 3.8  --   HGB 14.6 13.8  HCT 43.4 40.9  MCV 86.5 86.7  PLT 121* 101*   Cardiac Enzymes:  Recent Labs  04/13/13 1040 04/13/13 1500 04/13/13 2100  TROPONINI <0.30 <0.30 <0.30   BNP: No components found with this basename: POCBNP,  D-Dimer:  Recent Labs  04/13/13 0327  DDIMER 2.39*   Hemoglobin A1C:  Recent Labs  04/13/13 1040  HGBA1C 5.1   Fasting Lipid Panel:  Recent Labs  04/14/13 0515  CHOL 159  HDL 28*  LDLCALC 51  TRIG 045*  CHOLHDL 5.7   Thyroid Function Tests:  Recent Labs  04/13/13 1040  TSH 1.670    Radiology/Studies:  Ct Head Wo Contrast  04/13/2013   CLINICAL DATA:  Nausea  EXAM: CT HEAD WITHOUT CONTRAST  TECHNIQUE: Contiguous axial images were obtained from the base of the skull through the vertex without intravenous contrast.  COMPARISON:  None.   FINDINGS: Extensive streak artifact from the right cochlear implant is present. A right canal wall down mastoidectomy has been performed cochlear electrode is not well evaluated on this examination.  There is no acute intracranial hemorrhage or large vessel territory infarct. Please note that evaluation of the right cerebral hemisphere somewhat limited due to the cochlear implant.  No mass lesion or midline shift. Prominent calcification noted within the left coronal radiata adjacent to the left lateral ventricle. Ventricles are normal in size without evidence of hydrocephalus. Subcentimeter hypodensity within the left basal ganglia is most compatible with a remote lacunar infarct. There is no extra-axial fluid collection.  Calvarium is intact.  Orbits are within normal limits.  Paranasal sinuses and mastoid air cells are clear.  IMPRESSION: 1. No acute intracranial process. 2. Remote lacunar infarct within the left basal ganglia. 3. Right cochlear implant.   Electronically Signed   By: Rise Mu M.D.   On: 04/13/2013 06:05   Ct Angio Chest Pe W/cm &/or Wo Cm  04/13/2013   CLINICAL DATA:  Shortness of breath and elevated D-dimer. Nonradiating chest pain and nausea.  EXAM: CT ANGIOGRAPHY CHEST WITH CONTRAST  TECHNIQUE: Multidetector CT imaging of the chest was performed using the standard protocol during bolus administration of intravenous contrast. Multiplanar CT image reconstructions including MIPs were obtained to evaluate the vascular anatomy.  CONTRAST:  OMNIPAQUE IOHEXOL 350 MG/ML SOLN  COMPARISON:  Chest radiograph performed earlier today at 3:24 a.m.  FINDINGS: There is no evidence of pulmonary embolus.  Mild bibasilar atelectasis is noted. The lungs are otherwise clear. Evaluation is mildly suboptimal due to motion artifact. There is no evidence of significant focal consolidation, pleural effusion or pneumothorax. No masses are identified; no abnormal focal contrast enhancement is seen.   There is focal aneurysmal dilatation of the proximal descending thoracic aorta to 4.0 cm in diameter. No pericardial effusion is identified. The great vessels are grossly unremarkable in appearance. No mediastinal lymphadenopathy is appreciated. No axillary lymphadenopathy is seen. The visualized portions of the thyroid gland are unremarkable in appearance. A right-sided chest port is noted.  The visualized portions of the liver and spleen are unremarkable.  No acute osseous abnormalities are seen.  Review of the MIP images confirms the above findings.  IMPRESSION: 1. No evidence of pulmonary embolus. 2. Mild bibasilar atelectasis noted; lungs otherwise clear. 3. Focal aneurysmal dilatation of the proximal descending thoracic aorta to 4.0 cm in diameter.   Electronically Signed   By: Roanna Raider M.D.   On: 04/13/2013 06:10   Dg Chest Port 1 View  04/13/2013   CLINICAL DATA:  Chest pain and shortness of breath.  EXAM: PORTABLE CHEST - 1 VIEW  COMPARISON:  None.  FINDINGS: The lungs are well-aerated. Mild left basilar opacity may reflect atelectasis or possibly mild pneumonia. There is no evidence of pleural effusion or pneumothorax.  The cardiomediastinal silhouette is borderline normal in size. A right-sided chest port is noted ending about the mid to distal SVC. No acute osseous abnormalities are seen.  IMPRESSION: Mild left basilar airspace opacity may reflect atelectasis or possibly mild pneumonia.   Electronically Signed   By: Roanna Raider M.D.   On: 04/13/2013 03:47   Echo: *[place]* *[facility]* [address] [place], [place] [address] [phone]  ------------------------------------------------------------ Transthoracic Echocardiography  Patient: Bill Dunn, Bill Dunn MR #: [id number] Study Date: 04/13/2013 Gender: M Age: 77 Height: 174cm Weight: 104.5kg BSA: 2.74m^2 Pt. Status: Room: 3W03C  ATTENDING [place], Zaydee Aina PERFORMING [place], Hospital SONOGRAPHER Cathie Beams Candis Shine cc:  ------------------------------------------------------------ LV EF: 55%  ------------------------------------------------------------ Indications: Pericarditis - acute idiopathic 420.91.  ------------------------------------------------------------ History: PMH: Rule out pericardial effusion. Chest pain.  ------------------------------------------------------------ Study Conclusions  - Left ventricle: The cavity size was normal. Systolic function was normal. The estimated ejection fraction was 55%. Wall motion was normal; there were no regional wall motion abnormalities. - Atrial septum: No defect or patent foramen ovale was identified.  Cardiac Catheterization Procedure Note  Name: Teofilo Lupinacci  MRN: [id number]  DOB: May 22, 1935  Procedure: Left Heart Cath, Selective Coronary Angiography, LV angiography  Indication: 77 yo WM with history of HTN, hyperlipidemia and PAD presents with acute onset dyspnea and anterior pleuritic chest pain. Initial troponin was negative. D-dimer was elevated but CTA chest showed no evidence of PE. Serial Ecgs show subtle ST elevation in the inferior leads. Urgent cardiac cath was recommended.  Procedural Details: The right wrist was prepped, draped, and anesthetized with 1% lidocaine. Using the modified Seldinger technique, a 5 French sheath was introduced  into the right radial artery. 3 mg of verapamil was administered through the sheath, weight-based unfractionated heparin was administered intravenously. Standard Judkins catheters were used for selective coronary angiography and left ventriculography. An EZRAD right catheter was used for the RCA.Catheter exchanges were performed over an exchange length guidewire. There were no immediate procedural complications. A TR band was used for radial hemostasis at the completion of the procedure. The patient was transferred to the post catheterization recovery area for further  monitoring.  Procedural Findings:  Hemodynamics:  AO 151/82 mean 111 mm Hg  LV 150/24 mm Hg  Coronary angiography:  Coronary dominance: right  Left mainstem: Normal  Left anterior descending (LAD): Heavily calcified in the mid vessel. There is diffuse 40% disease in the mid LAD. The first diagonal is normal.  Left circumflex (LCx): The LCx gives rise to 2 OM branches. The first OM is large with mild disease up to 20%. The second OM is small with 40% disease in the mid vessel.  Right coronary artery (RCA): Dominant. Tortuous. Diffuse disease in the proximal to mid vessel up to 40%. 20% disease distally.  Left ventriculography: Left ventricular systolic function is normal, LVEF is estimated at 55-65%, there is no significant mitral regurgitation  Final Conclusions:  1. Nonobstructive CAD  2. Normal LV function.  Recommendations: Admit for further management of chest pain. Will obtain an Echo.  Theron Arista [facility]  04/13/2013, 7:27 AM   PHYSICAL EXAM General: Well developed, well nourished, in no acute distress. Head: Normocephalic, atraumatic, sclera non-icteric, no xanthomas, nares are without discharge. Neck: Negative for carotid bruits. JVD not elevated. Lungs: Clear bilaterally to auscultation without wheezes, rales, or rhonchi. Breathing is unlabored. Heart: RRR S1 S2 without murmurs, rubs, or gallops.  Abdomen: Soft, non-tender, non-distended with normoactive bowel sounds. No hepatomegaly. Extensive surgical scars. Msk:  Strength and tone appears normal for age. Extremities: No clubbing, cyanosis or edema.  Distal pedal pulses are 2+ and equal bilaterally. radial site without hematoma. Neuro: Alert and oriented X 3. Moves all extremities spontaneously. Psych:  Responds to questions appropriately with a normal affect.  ASSESSMENT AND PLAN: 1. Acute pleuritic chest pain. Etiology not clear. CT negative for PE. Cardiac cath with nonobstructive disease. Echo without pericardial  thickening/effusion. No fever. Will emprically treat with Colchicine for 2-3 weeks. Stable for DC today. Follow up in 2-3 weeks.  2. SVT- EMS noted SVT with rate 140 bpm- converted spontaneously. Avoid caffeine. Continue metoprolol.  3. PAD s/p aorto-iliac stent grafting  4. HTN controlled.  5. Crohn's disease  6. Nonobstructive CAD-risk factor modification with statin Rx.  Active Problems:   Acute chest pain   Acute pericarditis    Signed, Shea Swalley [place] MD,FACC

## 2013-04-14 NOTE — Telephone Encounter (Signed)
New problem   14 day TCM 04/28/13 @ 8am per Harlan PA

## 2013-04-14 NOTE — Discharge Summary (Signed)
Discharge Summary   Patient ID: Bill Dunn,  MRN: [id number], DOB/AGE: 75-Sep-1937 75 y.o.  Admit date: 04/13/2013 Discharge date: 04/14/2013  Primary Care Provider: A. Radiontchenko Kathryne Sharper) Primary Cardiologist: P. [place], MD   Discharge Diagnoses Principal Problem:   Pleuritic chest pain  **Non-obstructive CAD by catheterization this admission.  Active Problems:   Suspected Acute Pericarditis  **Empiric colchicine therapy initiated.  **Avoiding NSAIDs 2/2 h/o Crohn's.  **No pericardial effusion or thickening noted on Echo this admission.   SVT  **Present upon EMS arrival, broke spontaneously.   Crohn's disease   HTN (hypertension)   Hypertriglyceridemia   History of AAA (abdominal aortic aneurysm) repair   History of TIA (transient ischemic attack)  Allergies Allergies  Allergen Reactions  . Amoxil [Amoxicillin] Other (See Comments)    REACTION: C-diff  . Augmentin [Amoxicillin-Pot Clavulanate] Other (See Comments)    REACTION: C-Diff  . Demerol [Meperidine] Nausea And Vomiting  . Morphine And Related Nausea And Vomiting   Procedures  CT Head w/o contrast 12.11.2014  IMPRESSION: 1. No acute intracranial process. 2. Remote lacunar infarct within the left basal ganglia. 3. Right cochlear implant. _____________   CTA of the chest with contrast 12.11.2014  IMPRESSION: 1. No evidence of pulmonary embolus. 2. Mild bibasilar atelectasis noted; lungs otherwise clear. 3. Focal aneurysmal dilatation of the proximal descending thoracic aorta to 4.0 cm in diameter. _____________   Cardiac Catheterization 12.11.2014  Procedural Findings: Hemodynamics: AO 151/82 mean 111 mm Hg LV 150/24 mm Hg  Coronary angiography: Coronary dominance: right  Left mainstem: Normal Left anterior descending (LAD): Heavily calcified in the mid vessel. There is diffuse 40% disease in the mid LAD. The first diagonal is normal.   Left circumflex (LCx):  The LCx gives rise  to 2 OM branches. The first OM is large with mild disease up to 20%. The second OM is small with 40% disease in the mid vessel.   Right coronary artery (RCA): Dominant. Tortuous. Diffuse disease in the proximal to mid vessel up to 40%. 20% disease distally. Left ventriculography: Left ventricular systolic function is normal, LVEF is estimated at 55-65%, there is no significant mitral regurgitation   Final Conclusions:   1. Nonobstructive CAD 2. Normal LV function. _____________   2D Echocardiogram 12.11.2014  Study Conclusions  - Left ventricle: The cavity size was normal. Systolic   function was normal. The estimated ejection fraction was   55%. Wall motion was normal; there were no regional wall   motion abnormalities. - Atrial septum: No defect or patent foramen ovale was   identified. _____________  History of Present Illness  77 year old male with prior history of hypertension, hypertriglyceridemia, aortic aneurysm status post stenting, and TIAs. He reports a prior history of chest pain at approximately 2012, at which time he underwent diagnostic catheterization which was apparently nonobstructive. He was in his usual state of health until approximately 2 AM on the morning of admission when he awoke suddenly with dyspnea, nausea, disorientation, and chest discomfort that was worse upon taking a deep breath. EMS was called and the patient was found to be tachycardic with a heart rate of 140. ECG showed SVT. Patient complained of chest pain was treated with nitroglycerin and he converted to sinus rhythm. He was taken to the [facility] where his initial troponin was normal and d-dimer was elevated at 2.39. ECG showed subtle inferolateral ST segment elevation. CT of the head was performed secondary to poor disorientation and this showed no acute findings. In light  of elevated d-dimer, CT angiography of the chest was performed and was negative for pulmonary and was. Pleuritic chest pain and  dyspnea persisted and followup ECG showed some progression of inferolateral ST segment elevation. At that point, a code STEMI was called and the patient was taken to the cardiac catheterization laboratory for emergent diagnostic catheterization.   Hospital Course  Patient underwent diagnostic cardiac catheterization on the morning of December 11, revealing nonobstructive coronary artery disease and normal LV function. It was felt that his symptoms and EKG findings were likely secondary to acute pericarditis and post catheterization, he was initiated on colchicine therapy. A 2-D echocardiogram was performed on December 11, showing normal LV function without any evidence of pericardial thickening or effusion. He ruled out for myocardial infarction and had no further chest discomfort on colchicine therapy. He will be discharged home today in good condition and we have arranged cardiology followup within the next 2 weeks. We will plan to keep him on colchicine for 2-3 weeks.  Discharge Vitals Blood pressure 146/73, pulse 67, temperature 97.7 F (36.5 C), temperature source Oral, resp. rate 16, height 5\' 8"  (1.727 m), weight 229 lb 1.6 oz (103.919 kg), SpO2 99.00%.  Filed Weights   04/13/13 2100  Weight: 229 lb 1.6 oz (103.919 kg)    Labs  CBC  Recent Labs  04/13/13 0327 04/13/13 1040  WBC 7.0 6.0  NEUTROABS 3.8  --   HGB 14.6 13.8  HCT 43.4 40.9  MCV 86.5 86.7  PLT 121* 101*   Basic Metabolic Panel  Recent Labs  04/13/13 0327 04/13/13 1040  NA 137  --   K 4.3  --   CL 102  --   CO2 22  --   GLUCOSE 106*  --   BUN 25*  --   CREATININE 1.42* 1.43*  CALCIUM 8.7  --    Liver Function Tests  Recent Labs  04/13/13 0327  AST 31  ALT 36  ALKPHOS 58  BILITOT 0.3  PROT 6.8  ALBUMIN 3.4*   Cardiac Enzymes  Recent Labs  04/13/13 1040 04/13/13 1500 04/13/13 2100  TROPONINI <0.30 <0.30 <0.30   D-Dimer  Recent Labs  04/13/13 0327  DDIMER 2.39*   Hemoglobin  A1C  Recent Labs  04/13/13 1040  HGBA1C 5.1   Fasting Lipid Panel  Recent Labs  04/14/13 0515  CHOL 159  HDL 28*  LDLCALC 51  TRIG 213*  CHOLHDL 5.7   Thyroid Function Tests  Recent Labs  04/13/13 1040  TSH 1.670   Disposition  Pt is being discharged home today in good condition.  Follow-up Plans & Appointments  Follow-up Information   Follow up with Norma Fredrickson, NP On 04/28/2013. (8:00 AM - Dr. Elvis Coil Nurse Pracitioner)    Specialty:  Nurse Practitioner   Contact information:   [address] [place] [address] [phone]       Follow up with Verl Bangs, MD. (as scheduled.)    Specialty:  Family Medicine   Contact information:   [address] [place] [address] [phone]      Discharge Medications    Medication List         aspirin 81 MG chewable tablet  Chew 1 tablet (81 mg total) by mouth daily.     colchicine 0.6 MG tablet  Take 1 tablet (0.6 mg total) by mouth 2 (two) times daily.     COLESTID 1 G tablet  Generic drug:  colestipol  Take 1  g by mouth 2 (two) times daily.     CULTURELLE PO  Take 1 tablet by mouth 2 (two) times daily.     diphenoxylate-atropine 2.5-0.025 MG per tablet  Commonly known as:  LOMOTIL  Take 2 tablets by mouth 2 (two) times daily as needed for diarrhea or loose stools.     metoprolol succinate 100 MG 24 hr tablet  Commonly known as:  TOPROL-XL  Take 100 mg by mouth 2 (two) times daily. Take with or immediately following a meal.     nitroGLYCERIN 0.4 MG SL tablet  Commonly known as:  NITROSTAT  Place 1 tablet (0.4 mg total) under the tongue every 5 (five) minutes x 3 doses as needed for chest pain.     traZODone 100 MG tablet  Commonly known as:  DESYREL  Take 100-200 mg by mouth at bedtime as needed for sleep.     vitamin B-12 1000 MCG tablet  Commonly known as:  CYANOCOBALAMIN  Take 1,000 mcg by mouth daily.       Outstanding  Labs/Studies  None  Duration of Discharge Encounter   Greater than 30 minutes including physician time.  Signed, Nicolasa Ducking NP 04/14/2013, 11:29 AM

## 2013-04-14 NOTE — Clinical Documentation Improvement (Signed)
THIS DOCUMENT IS NOT A PERMANENT PART OF THE MEDICAL RECORD  Please update your documentation with the medical record to reflect your response to this query. If you need help knowing how to do this please call [phone].  04/14/13   Dear Ward Givens NP,  Per Notes patient with suspected "pericarditis". Please assign Acuity to illustrate the severity of illness and risk of mortality. Thank you.  Possible Clinical Conditions? - Acute pericarditis - other condition (please specify)    You may use possible, probable, or suspect with inpatient documentation. possible, probable, suspected diagnoses MUST be documented at the time of discharge  Reviewed: additional documentation in the medical record  Thank You,  Beverley Fiedler  Clinical Documentation Specialist: [phone] Health Information Management [place]

## 2013-04-17 NOTE — Telephone Encounter (Signed)
Patient contacted regarding discharge from [facility] on 12/12.  Patient understands to follow up with provider Norma Fredrickson, NP on 12/26 at 0800 at [facility]. Patient understands discharge instructions? yes Patient understands medications and regiment? yes Patient understands to bring all medications to this visit? yes  Spoke with patient who states he is "feeling great" and has no current concerns or questions.  I advised patient to call office with questions or concerns prior to appointment on 12/26.  Patient verbalized understanding

## 2013-04-28 ENCOUNTER — Encounter: Payer: Self-pay | Admitting: Nurse Practitioner

## 2013-04-28 ENCOUNTER — Ambulatory Visit (INDEPENDENT_AMBULATORY_CARE_PROVIDER_SITE_OTHER): Payer: Medicare Other | Admitting: Nurse Practitioner

## 2013-04-28 VITALS — BP 180/110 | HR 66 | Ht 68.5 in | Wt 229.8 lb

## 2013-04-28 DIAGNOSIS — G4733 Obstructive sleep apnea (adult) (pediatric): Secondary | ICD-10-CM

## 2013-04-28 DIAGNOSIS — I309 Acute pericarditis, unspecified: Secondary | ICD-10-CM

## 2013-04-28 DIAGNOSIS — I259 Chronic ischemic heart disease, unspecified: Secondary | ICD-10-CM

## 2013-04-28 DIAGNOSIS — I10 Essential (primary) hypertension: Secondary | ICD-10-CM

## 2013-04-28 LAB — BASIC METABOLIC PANEL
BUN: 23 mg/dL (ref 6–23)
CO2: 27 mEq/L (ref 19–32)
Calcium: 9.3 mg/dL (ref 8.4–10.5)
Chloride: 102 mEq/L (ref 96–112)
Creatinine, Ser: 1.5 mg/dL (ref 0.4–1.5)
GFR: 48.46 mL/min — ABNORMAL LOW (ref >60.00)
Glucose, Bld: 90 mg/dL (ref 70–99)
Potassium: 5 mEq/L (ref 3.5–5.1)
Sodium: 138 mEq/L (ref 135–145)

## 2013-04-28 MED ORDER — HYDROCHLOROTHIAZIDE 25 MG PO TABS: 25.0000 mg | ORAL_TABLET | Freq: Every day | ORAL | Status: DC

## 2013-04-28 NOTE — Progress Notes (Addendum)
Bill Dunn Date of Birth: 1936-04-04 Medical Record #[id number]  History of Present Illness: Bill Dunn is seen back today for a post hospital/TOC visit. Seen for Dr. [place]. He has a history of HTN, hypertriglyceridemia, AAA repair with stenting, prior TIA, who most recently presented with acute onset of dyspnea, nausea, disorientation and chest discomfort. EMS called - was found to be tachycardic with a heart rte of 140 and EKG showing SVT. Was given NTG and he converted to NSR. D dimer was elevated. CT of the head showed no acute findings. CTA of the chest was negative for PE. EKG was abnormal and he was subsequently cathed showing nonobstructive CAD and normal LV function. Felt to have acute pericarditis - treated with colchicine. Echo showed normal LV function and no pericardial thickening or effusion. He was to remain on colchicine for 2 to 3 weeks.   Comes back today. Here with his daughter. Doing ok. She has multiple concerns. He is doing ok. No more chest pain. Has chronic diarrhea that is regardless of the colchicine. No palpitations. Not short of breath. Not checking his BP at home. Leaving for [place] on January 15th until April. Likes salt. She notes a lot of snoring and apnea at night - has been trying to talk him into a sleep study. Sees the doctor in [place] that fixed his AAA. Not aware of his CTA results.   Daughter reported that he has been on what sounds like an ACE in the past for his BP but had a cough.  Current Outpatient Prescriptions  Medication Sig Dispense Refill  . allopurinol (ZYLOPRIM) 300 MG tablet Take 300 mg by mouth daily.      . colchicine 0.6 MG tablet Take 1 tablet (0.6 mg total) by mouth 2 (two) times daily.  42 tablet  1  . colestipol (COLESTID) 1 G tablet Take 1 g by mouth 2 (two) times daily.      . diphenoxylate-atropine (LOMOTIL) 2.5-0.025 MG per tablet Take 2 tablets by mouth 2 (two) times daily as needed for diarrhea or loose stools.      .  Erythromycin 2 % ointment Apply topically as needed.      . Lactobacillus Rhamnosus, GG, (CULTURELLE PO) Take 1 tablet by mouth 2 (two) times daily.      . metoprolol succinate (TOPROL-XL) 100 MG 24 hr tablet Take 100 mg by mouth 2 (two) times daily. Take with or immediately following a meal.      . nitroGLYCERIN (NITROSTAT) 0.4 MG SL tablet Place 1 tablet (0.4 mg total) under the tongue every 5 (five) minutes x 3 doses as needed for chest pain.  25 tablet  3  . traZODone (DESYREL) 100 MG tablet Take 100-200 mg by mouth at bedtime as needed for sleep.      . vitamin B-12 (CYANOCOBALAMIN) 1000 MCG tablet Take 1,000 mcg by mouth daily.       No current facility-administered medications for this visit.    Allergies  Allergen Reactions  . Amoxil [Amoxicillin] Other (See Comments)    REACTION: C-diff  . Augmentin [Amoxicillin-Pot Clavulanate] Other (See Comments)    REACTION: C-Diff  . Demerol [Meperidine] Nausea And Vomiting  . Morphine And Related Nausea And Vomiting    Past Medical History  Diagnosis Date  . Crohn disease     a. s/p multiple bowel resections;  b. s/p portacath.  Marland Kitchen HTN (hypertension)   . Hypertriglyceridemia   . AAA (abdominal aortic aneurysm)  a. s/p stent grafting in 2012, in [place], [place], per pt.  Marland Kitchen TIA (transient ischemic attack)     a. x 2, last ~ 2002  . Pleuritic chest pain     a. s/p reportedly nl cath in 2012, [place], [place];  b. 04/2013 Cath: LM nl, LAD 6m, D1 nl, LCX nl, OM1 20, OM2 sm 36m, RCA 40p/m, 20d, EF 55-65%;  c. 04/2013 Echo: EF 55%, no rwma;  d. chest pain improved with colchicine.  . Bladder cancer   . Sepsis ? 2002  . Arthritis   . SVT (supraventricular tachycardia)     a. 04/2013->bb therapy.  . Pericarditis 04/13/2013    Past Surgical History  Procedure Laterality Date  . Colon surgery      a. multiple bowel resections 2/2 chrohn's dzs.  . Abdominal aortic aneurysm repair      a. reported stenting 2012  . Appendectomy      . Renal cyst excision    . Inguinal hernia repair    . Spinal fusion    . Cochlear implant    . Left shoulder surgery    . Cholecystectomy      History  Smoking status  . Former Smoker  . Quit date: 05/05/1991  Smokeless tobacco  . Former User    History  Alcohol Use No    Family History  Problem Relation Age of Onset  . CAD Father     died @ 24  . CAD Mother     died @ 103    Review of Systems: The review of systems is per the HPI.  All other systems were reviewed and are negative.  Physical Exam: BP 180/110  Pulse 66  Ht 5' 8.5" (1.74 m)  Wt 229 lb 12.8 oz (104.237 kg)  BMI 34.43 kg/m2  SpO2 96% BP 160/90 by me.  Patient is very pleasant and in no acute distress. Little hard of hearing. He is obese. Using a cane. Skin is warm and dry. Color is normal.  HEENT is unremarkable. Normocephalic/atraumatic. PERRL. Sclera are nonicteric. Neck is supple. No masses. No JVD. Lungs are clear. Cardiac exam shows a regular rate and rhythm. Abdomen is soft. Extremities are without edema. Gait and ROM are intact. No gross neurologic deficits noted.  LABORATORY DATA: PENDING  EKG pending   Lab Results  Component Value Date   WBC 6.0 04/13/2013   HGB 13.8 04/13/2013   HCT 40.9 04/13/2013   PLT 101* 04/13/2013   GLUCOSE 106* 04/13/2013   CHOL 159 04/14/2013   TRIG 400* 04/14/2013   HDL 28* 04/14/2013   LDLCALC 51 04/14/2013   ALT 36 04/13/2013   AST 31 04/13/2013   NA 137 04/13/2013   K 4.3 04/13/2013   CL 102 04/13/2013   CREATININE 1.43* 04/13/2013   BUN 25* 04/13/2013   CO2 22 04/13/2013   TSH 1.670 04/13/2013   INR 0.94 04/13/2013   HGBA1C 5.1 04/13/2013   Echo Study Conclusions  - Left ventricle: The cavity size was normal. Systolic function was normal. The estimated ejection fraction was 55%. Wall motion was normal; there were no regional wall motion abnormalities. - Atrial septum: No defect or patent foramen ovale was identified.  Coronary  angiography:  Coronary dominance: right  Left mainstem: Normal  Left anterior descending (LAD): Heavily calcified in the mid vessel. There is diffuse 40% disease in the mid LAD. The first diagonal is normal.  Left circumflex (LCx): The LCx gives rise to 2  OM branches. The first OM is large with mild disease up to 20%. The second OM is small with 40% disease in the mid vessel.  Right coronary artery (RCA): Dominant. Tortuous. Diffuse disease in the proximal to mid vessel up to 40%. 20% disease distally.  Left ventriculography: Left ventricular systolic function is normal, LVEF is estimated at 55-65%, there is no significant mitral regurgitation  Final Conclusions:  1. Nonobstructive CAD  2. Normal LV function.  Recommendations: Admit for further management of chest pain. Will obtain an Echo.  Theron Arista [facility]  04/13/2013, 7:27 AM   CTA CHEST IMPRESSION: 1. No evidence of pulmonary embolus. 2. Mild bibasilar atelectasis noted; lungs otherwise clear. 3. Focal aneurysmal dilatation of the proximal descending thoracic aorta to 4.0 cm in diameter.   Electronically Signed By: Roanna Raider M.D. On: 04/13/2013 06:10      CT HEAD IMPRESSION: 1. No acute intracranial process. 2. Remote lacunar infarct within the left basal ganglia. 3. Right cochlear implant.   Electronically Signed By: Rise Mu M.D. On: 04/13/2013 06:05    Assessment / Plan: 1. Acute pericarditis - symptoms seem resolved - recheck EKG today - stop colchicine in one week. EKG today shows sinus rhythm - minimal inferior changes   2. Lone episode of SVT - on beta blocker  3. S/P cardiac cath - nonobstructive CAD noted - would manage medically  4. HTN - BP not controlled - I have added HCTZ 25 mg daily. I have asked him to cut back on his salt. Try to monitor BP at home and keep a diary. See before he leaves for [place].   5. HLD  6. Noted dilation of the descending thoracic aorta - probably needs  repeat study in 6 months. Needs BP control.   7. Probably OSA - will try to get a sleep study arranged with Dr. Mayford Knife.   Patient is agreeable to this plan and will call if any problems develop in the interim.   Rosalio Macadamia, RN, ANP-C [facility] [address] [place], [place]  [address]

## 2013-04-28 NOTE — Patient Instructions (Addendum)
Stay on your current medicines but you may stop your Colchicine after next Friday.   I am adding HCTZ 25 mg daily for your BP  We will check labs today  I will see you in 2 to 3 weeks  Check some blood pressures at home and keep a record  We will arrange for a sleep study (split study) with Dr. Mayford Knife  Cut back on your salt  Call the [facility] office at [phone] if you have any questions, problems or concerns.

## 2013-05-13 DIAGNOSIS — I259 Chronic ischemic heart disease, unspecified: Secondary | ICD-10-CM

## 2013-05-13 DIAGNOSIS — I10 Essential (primary) hypertension: Secondary | ICD-10-CM

## 2013-05-13 DIAGNOSIS — G4733 Obstructive sleep apnea (adult) (pediatric): Secondary | ICD-10-CM

## 2013-05-16 ENCOUNTER — Ambulatory Visit (INDEPENDENT_AMBULATORY_CARE_PROVIDER_SITE_OTHER): Payer: Medicare Other | Admitting: Nurse Practitioner

## 2013-05-16 ENCOUNTER — Encounter: Payer: Self-pay | Admitting: Nurse Practitioner

## 2013-05-16 VITALS — BP 140/90 | HR 65 | Ht 68.5 in | Wt 230.4 lb

## 2013-05-16 DIAGNOSIS — I10 Essential (primary) hypertension: Secondary | ICD-10-CM

## 2013-05-16 DIAGNOSIS — I309 Acute pericarditis, unspecified: Secondary | ICD-10-CM

## 2013-05-16 LAB — BASIC METABOLIC PANEL
BUN: 23 mg/dL (ref 6–23)
CO2: 27 mEq/L (ref 19–32)
Calcium: 9 mg/dL (ref 8.4–10.5)
Chloride: 104 mEq/L (ref 96–112)
Creatinine, Ser: 1.6 mg/dL — ABNORMAL HIGH (ref 0.4–1.5)
GFR: 44.97 mL/min — ABNORMAL LOW (ref >60.00)
Glucose, Bld: 114 mg/dL — ABNORMAL HIGH (ref 70–99)
Potassium: 4.8 mEq/L (ref 3.5–5.1)
Sodium: 140 mEq/L (ref 135–145)

## 2013-05-16 NOTE — Progress Notes (Signed)
Bill Dunn Date of Birth: Nov 27, 1935 Medical Record #[id number]  History of Present Illness: [name] is seen back today for a follow up visit. Seen for Dr. Martinique. He has a history of HTN, hypertriglyceridemia, AAA repair with stenting, prior TIA, who most recently presented with acute onset of dyspnea, nausea, disorientation and chest discomfort. EMS called - was found to be tachycardic with a heart rte of 140 and EKG showing SVT. Was given NTG and he converted to NSR. D dimer was elevated. CT of the head showed no acute findings. CTA of the chest was negative for PE. EKG was abnormal and he was subsequently cathed showing nonobstructive CAD and normal LV function. Felt to have acute pericarditis - treated with colchicine. Echo showed normal LV function and no pericardial thickening or effusion. He was to remain on colchicine for 2 to 3 weeks.   Seen 2 weeks ago - daughter had several concerns but overall, he was felt to be doing ok. No more chest pain. Had chronic diarrhea that is regardless of the colchicine. No palpitations. Not short of breath. Was not checking his BP at home. Leaving for [place] on January 15th until April. Likes salt. She noted a lot of snoring and apnea at night - had been trying to talk him into a sleep study. Sees the doctor in [place] that fixed his AAA. Not aware of his CTA results. We added low dose HCTZ and wanted to see him back prior to his leaving for [place] for the winter.   Comes back today. Here with his daughter. He is doing well. No real complaint. Finished his colchicine. No chest pain. Not short of breath. Tried to get his sleep study last night but this did not work out. BP has come down. Seeing his PCP later this week in [place]. Noted to have microalbuminuria - not on ACE/ARB. Probable cough on ACE in the past.   Current Outpatient Prescriptions  Medication Sig Dispense Refill  . colestipol (COLESTID) 1 G tablet Take 1 g by mouth 2 (two) times daily.       . diphenoxylate-atropine (LOMOTIL) 2.5-0.025 MG per tablet Take 2 tablets by mouth 2 (two) times daily as needed for diarrhea or loose stools.      . Erythromycin 2 % ointment Apply topically as needed.      . hydrochlorothiazide (HYDRODIURIL) 25 MG tablet Take 1 tablet (25 mg total) by mouth daily.  30 tablet  6  . Lactobacillus Rhamnosus, GG, (CULTURELLE PO) Take 1 tablet by mouth 2 (two) times daily.      . metoprolol succinate (TOPROL-XL) 100 MG 24 hr tablet Take 100 mg by mouth 2 (two) times daily. Take with or immediately following a meal.      . nitroGLYCERIN (NITROSTAT) 0.4 MG SL tablet Place 1 tablet (0.4 mg total) under the tongue every 5 (five) minutes x 3 doses as needed for chest pain.  25 tablet  3  . traZODone (DESYREL) 100 MG tablet Take 100-200 mg by mouth at bedtime as needed for sleep.      . vitamin B-12 (CYANOCOBALAMIN) 1000 MCG tablet Take 1,000 mcg by mouth daily.       No current facility-administered medications for this visit.    Allergies  Allergen Reactions  . Ace Inhibitors Cough  . Amoxil [Amoxicillin] Other (See Comments)    REACTION: C-diff  . Augmentin [Amoxicillin-Pot Clavulanate] Other (See Comments)    REACTION: C-Diff  . Demerol [Meperidine] Nausea And Vomiting  .  Morphine And Related Nausea And Vomiting    Past Medical History  Diagnosis Date  . Crohn disease     a. s/p multiple bowel resections;  b. s/p portacath.  Bill Dunn HTN (hypertension)   . Hypertriglyceridemia   . AAA (abdominal aortic aneurysm)     a. s/p stent grafting in 2012, in [place], [place], per pt.  Bill Dunn TIA (transient ischemic attack)     a. x 2, last ~ 2002  . Pleuritic chest pain     a. s/p reportedly nl cath in 2012, [place], [place];  b. 04/2013 Cath: LM nl, LAD 37m, D1 nl, LCX nl, OM1 20, OM2 sm 66m, RCA 40p/m, 20d, EF 55-65%;  c. 04/2013 Echo: EF 55%, no rwma;  d. chest pain improved with colchicine.  . Bladder cancer   . Sepsis ? 2002  . Arthritis   . SVT  (supraventricular tachycardia)     a. 04/2013->bb therapy.  . Pericarditis 04/13/2013    Past Surgical History  Procedure Laterality Date  . Colon surgery      a. multiple bowel resections 2/2 chrohn's dzs.  . Abdominal aortic aneurysm repair      a. reported stenting 2012  . Appendectomy    . Renal cyst excision    . Inguinal hernia repair    . Spinal fusion    . Cochlear implant    . Left shoulder surgery    . Cholecystectomy      History  Smoking status  . Former Smoker  . Quit date: 05/05/1991  Smokeless tobacco  . Former User    History  Alcohol Use No    Family History  Problem Relation Age of Onset  . CAD Father     died @ 22  . CAD Mother     died @ 13    Review of Systems: The review of systems is per the HPI.  All other systems were reviewed and are negative.  Physical Exam: BP 140/90  Pulse 65  Ht 5' 8.5" (1.74 m)  Wt 230 lb 6.4 oz (104.509 kg)  BMI 34.52 kg/m2  SpO2 94% Patient is very pleasant and in no acute distress. Little hard of hearing. Skin is warm and dry. Color is normal.  HEENT is unremarkable. Normocephalic/atraumatic. PERRL. Sclera are nonicteric. Neck is supple. No masses. No JVD. Lungs are clear. Cardiac exam shows a regular rate and rhythm. Abdomen is soft. Extremities are without edema. Gait and ROM are intact. No gross neurologic deficits noted.  Wt Readings from Last 3 Encounters:  05/16/13 230 lb 6.4 oz (104.509 kg)  04/28/13 229 lb 12.8 oz (104.237 kg)  04/13/13 229 lb 1.6 oz (103.919 kg)     LABORATORY DATA: BMET is pending  Lab Results  Component Value Date   WBC 6.0 04/13/2013   HGB 13.8 04/13/2013   HCT 40.9 04/13/2013   PLT 101* 04/13/2013   GLUCOSE 90 04/28/2013   CHOL 159 04/14/2013   TRIG 400* 04/14/2013   HDL 28* 04/14/2013   LDLCALC 51 04/14/2013   ALT 36 04/13/2013   AST 31 04/13/2013   NA 138 04/28/2013   K 5.0 04/28/2013   CL 102 04/28/2013   CREATININE 1.5 04/28/2013   BUN 23 04/28/2013   CO2  27 04/28/2013   TSH 1.670 04/13/2013   INR 0.94 04/13/2013   HGBA1C 5.1 04/13/2013   Echo Study Conclusions  - Left ventricle: The cavity size was normal. Systolic function was normal. The estimated ejection  fraction was 55%. Wall motion was normal; there were no regional wall motion abnormalities. - Atrial septum: No defect or patent foramen ovale was identified.  Coronary angiography:  Coronary dominance: right  Left mainstem: Normal  Left anterior descending (LAD): Heavily calcified in the mid vessel. There is diffuse 40% disease in the mid LAD. The first diagonal is normal.  Left circumflex (LCx): The LCx gives rise to 2 OM branches. The first OM is large with mild disease up to 20%. The second OM is small with 40% disease in the mid vessel.  Right coronary artery (RCA): Dominant. Tortuous. Diffuse disease in the proximal to mid vessel up to 40%. 20% disease distally.  Left ventriculography: Left ventricular systolic function is normal, LVEF is estimated at 55-65%, there is no significant mitral regurgitation  Final Conclusions:  1. Nonobstructive CAD  2. Normal LV function.  Recommendations: Admit for further management of chest pain. Will obtain an Echo.  Collier Salina [facility]  04/13/2013, 7:27 AM    CTA CHEST IMPRESSION: 1. No evidence of pulmonary embolus. 2. Mild bibasilar atelectasis noted; lungs otherwise clear. 3. Focal aneurysmal dilatation of the proximal descending thoracic aorta to 4.0 cm in diameter.   Electronically Signed By: Garald Balding M.D. On: 04/13/2013 06:10      CT HEAD IMPRESSION: 1. No acute intracranial process. 2. Remote lacunar infarct within the left basal ganglia. 3. Right cochlear implant.   Electronically Signed By: Jeannine Boga M.D. On: 04/13/2013 06:05  Assessment / Plan:  1. Acute pericarditis - resolved.   2. Lone episode of SVT - on beta blocker - no recurrence  3. S/P cardiac cath - nonobstructive CAD noted - to  manage medically   4. HTN - now on HCTZ 25 mg daily. BP has improved.   5. HLD   6. Noted dilation of the descending thoracic aorta - probably needs repeat study in 6 months. Needs good BP control.   7. Probably OSA - unable to get the sleep study due to problems on our end  8. Microalbuminuria - probably could benefit from ARB therapy - will defer to his PCP once he gets to [place].  We will see him back in May. Check BMET today. No change in current regimen.  Patient is agreeable to this plan and will call if any problems develop in the interim.   Burtis Junes, RN, [place]  [address] [place]  [place], [place] [address]

## 2013-05-16 NOTE — Patient Instructions (Addendum)
Stay on your current medicines  See your regular doctor in [place] as planned  We will check follow up lab today to look at your kidney function/potassium  Dr. Martinique will see you back in May  Call the [place] office at [phone] if you have any questions, problems or concerns.

## 2014-04-12 ENCOUNTER — Encounter (HOSPITAL_COMMUNITY): Payer: Self-pay | Admitting: Cardiology

## 2014-04-27 ENCOUNTER — Encounter (HOSPITAL_COMMUNITY): Payer: Self-pay | Admitting: *Deleted

## 2014-04-27 ENCOUNTER — Observation Stay (HOSPITAL_COMMUNITY)
Admission: EM | Admit: 2014-04-27 | Discharge: 2014-04-28 | Disposition: A | Discharge status: Home / Self Care | Payer: Medicare Other | Attending: Cardiovascular Disease | Admitting: Cardiovascular Disease

## 2014-04-27 ENCOUNTER — Emergency Department (HOSPITAL_COMMUNITY): Payer: Medicare Other

## 2014-04-27 DIAGNOSIS — R079 Chest pain, unspecified: Principal | ICD-10-CM | POA: Diagnosis present

## 2014-04-27 DIAGNOSIS — R7989 Other specified abnormal findings of blood chemistry: Secondary | ICD-10-CM

## 2014-04-27 DIAGNOSIS — E781 Pure hyperglyceridemia: Secondary | ICD-10-CM | POA: Diagnosis not present

## 2014-04-27 DIAGNOSIS — Z8673 Personal history of transient ischemic attack (TIA), and cerebral infarction without residual deficits: Secondary | ICD-10-CM | POA: Insufficient documentation

## 2014-04-27 DIAGNOSIS — Z7982 Long term (current) use of aspirin: Secondary | ICD-10-CM | POA: Diagnosis not present

## 2014-04-27 DIAGNOSIS — I10 Essential (primary) hypertension: Secondary | ICD-10-CM | POA: Diagnosis present

## 2014-04-27 DIAGNOSIS — I472 Ventricular tachycardia: Secondary | ICD-10-CM | POA: Insufficient documentation

## 2014-04-27 DIAGNOSIS — I2510 Atherosclerotic heart disease of native coronary artery without angina pectoris: Secondary | ICD-10-CM | POA: Diagnosis not present

## 2014-04-27 DIAGNOSIS — K5090 Crohn's disease, unspecified, without complications: Secondary | ICD-10-CM | POA: Diagnosis present

## 2014-04-27 DIAGNOSIS — Z79899 Other long term (current) drug therapy: Secondary | ICD-10-CM | POA: Insufficient documentation

## 2014-04-27 DIAGNOSIS — Z8551 Personal history of malignant neoplasm of bladder: Secondary | ICD-10-CM | POA: Diagnosis not present

## 2014-04-27 DIAGNOSIS — Z9889 Other specified postprocedural states: Secondary | ICD-10-CM

## 2014-04-27 DIAGNOSIS — I4729 Other ventricular tachycardia: Secondary | ICD-10-CM

## 2014-04-27 DIAGNOSIS — I739 Peripheral vascular disease, unspecified: Secondary | ICD-10-CM | POA: Insufficient documentation

## 2014-04-27 DIAGNOSIS — R0789 Other chest pain: Secondary | ICD-10-CM

## 2014-04-27 LAB — CBC WITH DIFFERENTIAL/PLATELET
Basophils Absolute: 0.1 10*3/uL (ref 0.0–0.1)
Basophils Relative: 1 % (ref 0–1)
Eosinophils Absolute: 0.2 10*3/uL (ref 0.0–0.7)
Eosinophils Relative: 3 % (ref 0–5)
HCT: 42.8 % (ref 39.0–52.0)
HEMOGLOBIN: 13.6 g/dL (ref 13.0–17.0)
LYMPHS ABS: 2.2 10*3/uL (ref 0.7–4.0)
LYMPHS PCT: 28 % (ref 12–46)
MCH: 27.3 pg (ref 26.0–34.0)
MCHC: 31.8 g/dL (ref 30.0–36.0)
MCV: 85.9 fL (ref 78.0–100.0)
MONOS PCT: 9 % (ref 3–12)
Monocytes Absolute: 0.8 10*3/uL (ref 0.1–1.0)
NEUTROS ABS: 4.8 10*3/uL (ref 1.7–7.7)
NEUTROS PCT: 59 % (ref 43–77)
PLATELETS: 143 10*3/uL — AB (ref 150–400)
RBC: 4.98 MIL/uL (ref 4.22–5.81)
RDW: 14.3 % (ref 11.5–15.5)
WBC: 8.1 10*3/uL (ref 4.0–10.5)

## 2014-04-27 LAB — TROPONIN I
Troponin I: <0.03 ng/mL (ref <0.031)
Troponin I: 0.03 ng/mL (ref <0.031)
Troponin I: <0.03 ng/mL (ref <0.031)

## 2014-04-27 LAB — BASIC METABOLIC PANEL
ANION GAP: 10 (ref 5–15)
BUN: 31 mg/dL — ABNORMAL HIGH (ref 6–23)
CALCIUM: 8.6 mg/dL (ref 8.4–10.5)
CO2: 22 mmol/L (ref 19–32)
CREATININE: 1.47 mg/dL — AB (ref 0.50–1.35)
Chloride: 110 mEq/L (ref 96–112)
GFR calc non Af Amer: 45 mL/min — ABNORMAL LOW (ref >90)
GFR, EST AFRICAN AMERICAN: 52 mL/min — AB (ref >90)
Glucose, Bld: 96 mg/dL (ref 70–99)
Potassium: 4.8 mmol/L (ref 3.5–5.1)
Sodium: 142 mmol/L (ref 135–145)

## 2014-04-27 LAB — COMPREHENSIVE METABOLIC PANEL
ALT: 50 U/L (ref 0–53)
ANION GAP: 11 (ref 5–15)
AST: 40 U/L — ABNORMAL HIGH (ref 0–37)
Albumin: 3.8 g/dL (ref 3.5–5.2)
Alkaline Phosphatase: 51 U/L (ref 39–117)
BILIRUBIN TOTAL: 0.2 mg/dL — AB (ref 0.3–1.2)
BUN: 31 mg/dL — AB (ref 6–23)
CHLORIDE: 105 meq/L (ref 96–112)
CO2: 25 mmol/L (ref 19–32)
Calcium: 9 mg/dL (ref 8.4–10.5)
Creatinine, Ser: 1.59 mg/dL — ABNORMAL HIGH (ref 0.50–1.35)
GFR calc non Af Amer: 41 mL/min — ABNORMAL LOW (ref >90)
GFR, EST AFRICAN AMERICAN: 47 mL/min — AB (ref >90)
GLUCOSE: 113 mg/dL — AB (ref 70–99)
Potassium: 4.4 mmol/L (ref 3.5–5.1)
SODIUM: 141 mmol/L (ref 135–145)
TOTAL PROTEIN: 6.9 g/dL (ref 6.0–8.3)

## 2014-04-27 LAB — LIPID PANEL
Cholesterol: 162 mg/dL (ref 0–200)
HDL: 44 mg/dL (ref >39)
LDL CALC: 67 mg/dL (ref 0–99)
Total CHOL/HDL Ratio: 3.7 RATIO
Triglycerides: 255 mg/dL — ABNORMAL HIGH (ref <150)
VLDL: 51 mg/dL — ABNORMAL HIGH (ref 0–40)

## 2014-04-27 LAB — MAGNESIUM: Magnesium: 1.5 mg/dL (ref 1.5–2.5)

## 2014-04-27 LAB — D-DIMER, QUANTITATIVE (NOT AT ARMC): D DIMER QUANT: 2.21 ug{FEU}/mL — AB (ref 0.00–0.48)

## 2014-04-27 LAB — POTASSIUM: POTASSIUM: 4.4 mmol/L (ref 3.5–5.1)

## 2014-04-27 MED ORDER — NITROGLYCERIN 2 % TD OINT
1.0000 [in_us] | TOPICAL_OINTMENT | Freq: Once | TRANSDERMAL | Status: AC
  Administered 2014-04-27: 1 [in_us] via TOPICAL
  Filled 2014-04-27: qty 1

## 2014-04-27 MED ORDER — ASPIRIN EC 81 MG PO TBEC
81.0000 mg | DELAYED_RELEASE_TABLET | Freq: Every day | ORAL | Status: DC
  Administered 2014-04-28: 81 mg via ORAL
  Filled 2014-04-27: qty 1

## 2014-04-27 MED ORDER — ASPIRIN 81 MG PO CHEW
324.0000 mg | CHEWABLE_TABLET | Freq: Once | ORAL | Status: DC
Start: 2014-04-27 — End: 2014-04-28

## 2014-04-27 MED ORDER — METOPROLOL SUCCINATE ER 100 MG PO TB24
200.0000 mg | ORAL_TABLET | Freq: Every day | ORAL | Status: DC
  Administered 2014-04-27: 200 mg via ORAL
  Filled 2014-04-27 (×2): qty 2

## 2014-04-27 MED ORDER — SODIUM CHLORIDE 0.9 % IJ SOLN
3.0000 mL | INTRAMUSCULAR | Status: DC | PRN
  Administered 2014-04-28: 13:00:00 via INTRAVENOUS
  Filled 2014-04-27: qty 3

## 2014-04-27 MED ORDER — TRAZODONE HCL 100 MG PO TABS
100.0000 mg | ORAL_TABLET | Freq: Every evening | ORAL | Status: DC | PRN
  Filled 2014-04-27: qty 2

## 2014-04-27 MED ORDER — NITROGLYCERIN 0.4 MG SL SUBL
0.4000 mg | SUBLINGUAL_TABLET | SUBLINGUAL | Status: DC | PRN
  Administered 2014-04-27 (×2): 0.4 mg via SUBLINGUAL

## 2014-04-27 MED ORDER — HYDROCODONE-ACETAMINOPHEN 5-325 MG PO TABS
1.0000 | ORAL_TABLET | Freq: Four times a day (QID) | ORAL | Status: DC | PRN
Start: 2014-04-27 — End: 2014-04-28

## 2014-04-27 MED ORDER — SODIUM CHLORIDE 0.9 % IJ SOLN: 3.0000 mL | Freq: Two times a day (BID) | INTRAMUSCULAR | Status: DC

## 2014-04-27 MED ORDER — NITROGLYCERIN 0.4 MG SL SUBL
SUBLINGUAL_TABLET | SUBLINGUAL | Status: AC
  Filled 2014-04-27: qty 0.3

## 2014-04-27 MED ORDER — AMLODIPINE BESYLATE 5 MG PO TABS
5.0000 mg | ORAL_TABLET | Freq: Every day | ORAL | Status: DC
  Administered 2014-04-27 – 2014-04-28 (×2): 5 mg via ORAL
  Filled 2014-04-27 (×2): qty 1

## 2014-04-27 MED ORDER — SODIUM CHLORIDE 0.9 % IV SOLN: 250.0000 mL | INTRAVENOUS | Status: DC | PRN

## 2014-04-27 NOTE — Progress Notes (Signed)
UR completed 

## 2014-04-27 NOTE — ED Notes (Signed)
Cardiology at bedside.

## 2014-04-27 NOTE — ED Provider Notes (Signed)
CSN: [id number]     Arrival date & time 04/27/14  0018 History  This chart was scribe for Josue Hector, MD by Judithann Sauger, ED Scribe. The patient was seen in room 2W11C/2W11C-01 and the patient's care was started at 12:34 AM.    Chief Complaint  Patient presents with  . Chest Pain   Patient is a 78 y.o. male presenting with chest pain. The history is provided by the patient. No language interpreter was used.  Chest Pain Pain location:  L chest Pain severity:  Severe Onset quality:  Sudden Duration:  2 hours Progression:  Improving Chronicity:  Recurrent Worsened by:  Nothing tried Ineffective treatments:  None tried Associated symptoms: shortness of breath   Associated symptoms: no abdominal pain, no back pain, no cough, no dizziness, no fever, no headache, no nausea, no numbness, no palpitations, not vomiting and no weakness    HPI Comments: Bill Dunn is a 78 y.o. male brought in by ambulance, who presents to the Emergency Department complaining of a sudden severe CP onset 10:40 pm yesterday. He explains the pain as an "elephant sitting on his chest". He reports that currently, his pain has gone down a little and it is concentrated on his left side. He reports SOB and HA. He denies N/V and swelling in legs but reports chronic leg pain. His wife adds that he was incoherent and slumped over during the incident. She states that they called EMT because he was not breathing. He reports that this is his fifth time this year he is experiencing similar symptoms.   Past Medical History  Diagnosis Date  . Crohn disease     a. s/p multiple bowel resections;  b. s/p portacath.  Marland Kitchen HTN (hypertension)   . Hypertriglyceridemia   . AAA (abdominal aortic aneurysm)     a. s/p stent grafting in 2012, in [place], [place], per pt.  Marland Kitchen TIA (transient ischemic attack)     a. x 2, last ~ 2002  . Pleuritic chest pain     a. s/p reportedly nl cath in 2012, [place], [place];  b. 04/2013 Cath: LM  nl, LAD 12m, D1 nl, LCX nl, OM1 20, OM2 sm 7m, RCA 40p/m, 20d, EF 55-65%;  c. 04/2013 Echo: EF 55%, no rwma;  d. chest pain improved with colchicine.  . Bladder cancer   . Sepsis ? 2002  . Arthritis   . SVT (supraventricular tachycardia)     a. 04/2013->bb therapy.  . Pericarditis 04/13/2013   Past Surgical History  Procedure Laterality Date  . Colon surgery      a. multiple bowel resections 2/2 chrohn's dzs.  . Abdominal aortic aneurysm repair      a. reported stenting 2012  . Appendectomy    . Renal cyst excision    . Inguinal hernia repair    . Spinal fusion    . Cochlear implant    . Left shoulder surgery    . Cholecystectomy    . Left heart catheterization with coronary angiogram Bilateral 04/13/2013    Procedure: LEFT HEART CATHETERIZATION WITH CORONARY ANGIOGRAM;  Surgeon: Peter M Martinique, MD;  Location: [facility] CATH LAB;  Service: Cardiovascular;  Laterality: Bilateral;   Family History  Problem Relation Age of Onset  . CAD Father     died @ 18  . CAD Mother     died @ 33   History  Substance Use Topics  . Smoking status: Former Smoker    Quit date: 05/05/1991  .  Smokeless tobacco: Former Systems developer  . Alcohol Use: No    Review of Systems  Constitutional: Negative for fever and chills.  Respiratory: Positive for shortness of breath. Negative for cough.   Cardiovascular: Positive for chest pain. Negative for palpitations and leg swelling.  Gastrointestinal: Negative for nausea, vomiting, abdominal pain, diarrhea and constipation.  Musculoskeletal: Negative for back pain, neck pain and neck stiffness.  Skin: Negative for rash and wound.  Neurological: Negative for dizziness, weakness, light-headedness, numbness and headaches.  All other systems reviewed and are negative.     Allergies  Amoxil; Augmentin; Ciprofloxacin; Demerol; Infliximab; Morphine and related; and Ace inhibitors  Home Medications   Prior to Admission medications   Medication Sig Start Date End  Date Taking? Authorizing Provider  aspirin 81 MG chewable tablet Chew 324 mg by mouth once.   Yes Historical Provider, MD  colestipol (COLESTID) 1 G tablet Take 1 g by mouth 2 (two) times daily.   Yes Historical Provider, MD  diphenoxylate-atropine (LOMOTIL) 2.5-0.025 MG per tablet Take 2 tablets by mouth 2 (two) times daily as needed for diarrhea or loose stools.   Yes Historical Provider, MD  Fluticasone Furoate-Vilanterol (BREO ELLIPTA) 100-25 MCG/INH AEPB Inhale 1 puff into the lungs at bedtime. 12/05/13  Yes Historical Provider, MD  hydrochlorothiazide (HYDRODIURIL) 25 MG tablet Take 1 tablet (25 mg total) by mouth daily. Patient taking differently: Take 25 mg by mouth every other day.  04/28/13  Yes Burtis Junes, NP  HYDROcodone-acetaminophen (NORCO/VICODIN) 5-325 MG per tablet Take 1 tablet by mouth every 6 (six) hours as needed for moderate pain.  04/10/14  Yes Historical Provider, MD  Lactobacillus Rhamnosus, GG, (CULTURELLE PO) Take 1 tablet by mouth 2 (two) times daily.   Yes Historical Provider, MD  metoprolol (TOPROL-XL) 200 MG 24 hr tablet Take 200 mg by mouth at bedtime.   Yes Historical Provider, MD  nitroGLYCERIN (NITROSTAT) 0.4 MG SL tablet Place 1 tablet (0.4 mg total) under the tongue every 5 (five) minutes x 3 doses as needed for chest pain. 04/14/13  Yes Rogelia Mire, NP  ondansetron (ZOFRAN) 4 MG tablet Take 4 mg by mouth daily as needed for nausea or vomiting.    Yes Historical Provider, MD  traZODone (DESYREL) 100 MG tablet Take 100-200 mg by mouth at bedtime as needed for sleep.   Yes Historical Provider, MD   BP 170/79 mmHg  Pulse 75  Temp(Src) 98.4 F (36.9 C) (Oral)  Resp 18  Ht 5\' 8"  (1.727 m)  Wt 255 lb 14.4 oz (116.075 kg)  BMI 38.92 kg/m2  SpO2 98% Physical Exam  Constitutional: He is oriented to person, place, and time. He appears well-developed and well-nourished. No distress.  HENT:  Head: Normocephalic and atraumatic.  Mouth/Throat: Oropharynx is  clear and moist.  Eyes: EOM are normal. Pupils are equal, round, and reactive to light.  Neck: Normal range of motion. Neck supple.  Cardiovascular: Normal rate and regular rhythm.   Pulmonary/Chest: Effort normal and breath sounds normal. No respiratory distress. He has no wheezes. He has no rales. He exhibits no tenderness.  Abdominal: Soft. Bowel sounds are normal. He exhibits no distension and no mass. There is no tenderness. There is no rebound and no guarding.  Musculoskeletal: Normal range of motion. He exhibits no edema or tenderness.  No calf swelling or tenderness.  Neurological: He is alert and oriented to person, place, and time.  Awake and alert. 5/5 motor in all extremities. Sensation intact.  Skin:  Skin is warm and dry. No rash noted. No erythema.  Psychiatric: He has a normal mood and affect. His behavior is normal.  Nursing note and vitals reviewed.   ED Course  Procedures (including critical care time) DIAGNOSTIC STUDIES: Oxygen Saturation is 94% on RA, adequate by my interpretation.    COORDINATION OF CARE: 12:39 AM- Pt advised of plan for treatment and pt agrees.   Labs Review Labs Reviewed  CBC WITH DIFFERENTIAL - Abnormal; Notable for the following:    Platelets 143 (*)    All other components within normal limits  COMPREHENSIVE METABOLIC PANEL - Abnormal; Notable for the following:    Glucose, Bld 113 (*)    BUN 31 (*)    Creatinine, Ser 1.59 (*)    AST 40 (*)    Total Bilirubin 0.2 (*)    GFR calc non Af Amer 41 (*)    GFR calc Af Amer 47 (*)    All other components within normal limits  BASIC METABOLIC PANEL - Abnormal; Notable for the following:    BUN 31 (*)    Creatinine, Ser 1.47 (*)    GFR calc non Af Amer 45 (*)    GFR calc Af Amer 52 (*)    All other components within normal limits  LIPID PANEL - Abnormal; Notable for the following:    Triglycerides 255 (*)    VLDL 51 (*)    All other components within normal limits  D-DIMER, QUANTITATIVE  - Abnormal; Notable for the following:    D-Dimer, Quant 2.21 (*)    All other components within normal limits  TROPONIN I  TROPONIN I  TROPONIN I  TROPONIN I  POTASSIUM  MAGNESIUM    Imaging Review Dg Chest Port 1 View  04/27/2014   CLINICAL DATA:  Shortness of breath and chest pain  EXAM: PORTABLE CHEST - 1 VIEW  COMPARISON:  Chest CT and radiograph 04/13/2013  FINDINGS: The heart size is moderately enlarged without evidence for edema. Left-sided Port-A-Cath tip terminates over the high right atrium. Both lungs are clear. The visualized skeletal structures are unremarkable.  IMPRESSION: Cardiomegaly without focal acute finding.   Electronically Signed   By: Conchita Paris M.D.   On: 04/27/2014 01:37     EKG Interpretation None      MDM   Final diagnoses:  Chest pain     I personally performed the services described in this documentation, which was scribed in my presence. The recorded information has been reviewed and is accurate.   Given history of coronary artery disease, cardiology consulted. Evaluated the patient in the emergency department and will admit. Patient's chest pressure resolved with nitroglycerin.  Julianne Rice, MD 04/28/14 [phone]

## 2014-04-27 NOTE — Progress Notes (Signed)
Subjective:  No CP/SOB  Objective:  Temp:  [98.8 F (37.1 C)] 98.8 F (37.1 C) (12/25 0027) Pulse Rate:  [61-85] 63 (12/25 0345) Resp:  [13-17] 16 (12/25 0427) BP: (133-167)/(76-104) 133/77 mmHg (12/25 0440) SpO2:  [93 %-97 %] 97 % (12/25 0427) Weight:  [235 lb (106.595 kg)-255 lb 14.4 oz (116.075 kg)] 255 lb 14.4 oz (116.075 kg) (12/25 0433) Weight change:   Intake/Output from previous day:    Intake/Output from this shift:    Physical Exam: General appearance: alert and no distress Neck: no adenopathy, no carotid bruit, no JVD, supple, symmetrical, trachea midline and thyroid not enlarged, symmetric, no tenderness/mass/nodules Lungs: clear to auscultation bilaterally Heart: regular rate and rhythm, S1, S2 normal, no murmur, click, rub or gallop Extremities: extremities normal, atraumatic, no cyanosis or edema  Lab Results: Results for orders placed or performed during the hospital encounter of 04/27/14 (from the past 48 hour(s))  CBC with Differential     Status: Abnormal   Collection Time: 04/27/14 12:40 AM  Result Value Ref Range   WBC 8.1 4.0 - 10.5 K/uL   RBC 4.98 4.22 - 5.81 MIL/uL   Hemoglobin 13.6 13.0 - 17.0 g/dL   HCT 42.8 39.0 - 52.0 %   MCV 85.9 78.0 - 100.0 fL   MCH 27.3 26.0 - 34.0 pg   MCHC 31.8 30.0 - 36.0 g/dL   RDW 14.3 11.5 - 15.5 %   Platelets 143 (L) 150 - 400 K/uL   Neutrophils Relative % 59 43 - 77 %   Neutro Abs 4.8 1.7 - 7.7 K/uL   Lymphocytes Relative 28 12 - 46 %   Lymphs Abs 2.2 0.7 - 4.0 K/uL   Monocytes Relative 9 3 - 12 %   Monocytes Absolute 0.8 0.1 - 1.0 K/uL   Eosinophils Relative 3 0 - 5 %   Eosinophils Absolute 0.2 0.0 - 0.7 K/uL   Basophils Relative 1 0 - 1 %   Basophils Absolute 0.1 0.0 - 0.1 K/uL  Comprehensive metabolic panel     Status: Abnormal   Collection Time: 04/27/14 12:40 AM  Result Value Ref Range   Sodium 141 135 - 145 mmol/L    Comment: Please note change in reference range.   Potassium 4.4 3.5 - 5.1  mmol/L    Comment: Please note change in reference range.   Chloride 105 96 - 112 mEq/L   CO2 25 19 - 32 mmol/L   Glucose, Bld 113 (H) 70 - 99 mg/dL   BUN 31 (H) 6 - 23 mg/dL   Creatinine, Ser 1.59 (H) 0.50 - 1.35 mg/dL   Calcium 9.0 8.4 - 10.5 mg/dL   Total Protein 6.9 6.0 - 8.3 g/dL   Albumin 3.8 3.5 - 5.2 g/dL   AST 40 (H) 0 - 37 U/L   ALT 50 0 - 53 U/L   Alkaline Phosphatase 51 39 - 117 U/L   Total Bilirubin 0.2 (L) 0.3 - 1.2 mg/dL   GFR calc non Af Amer 41 (L) >90 mL/min   GFR calc Af Amer 47 (L) >90 mL/min    Comment: (NOTE) The eGFR has been calculated using the CKD EPI equation. This calculation has not been validated in all clinical situations. eGFR's persistently <90 mL/min signify possible Chronic Kidney Disease.    Anion gap 11 5 - 15  Troponin I     Status: None   Collection Time: 04/27/14 12:40 AM  Result Value Ref Range   Troponin  I <0.03 <0.031 ng/mL    Comment:        NO INDICATION OF MYOCARDIAL INJURY. Please note change in reference range.     Imaging: Imaging results have been reviewed  Assessment/Plan:   1. Active Problems: 2.   Chest pain 3.   Time Spent Directly with Patient:  15 minutes  Length of Stay:  LOS: 0 days   Atypical CP however is awakened him from sleep and was associated with SOB. Exam benign. Labs OK. Enz neg. EKG w/o acute changed. CXR NAD. Will cycle enz and check d-dimer. If all ok , home AM and ROV with Dr. Martinique. With nl cath 1 year ago it's unlikely that this represents ACS   Bill Dunn 04/27/2014, 7:40 AM

## 2014-04-27 NOTE — ED Notes (Signed)
Pt. Was asleep and woke up with 10/10 chest pain across chest radiating to back and has SOB.

## 2014-04-27 NOTE — Progress Notes (Signed)
Upon assessment at 0425 patient was found to have chest pain rating 2/10. Patient was not exhibiting signs of distress, no SOB, no diaphoresis. Oxygen was already going at 2L [place]. EKG taken.  0425 One nitroglycerin tablet SL administered. BP 167/100  0430 pain still 2/10 gave another SL nitro. BP 147/84  0435 pain subsided to 0/10. BP 133/47.  VSS. Patient resting comfortably and instructed to utilize the call bell to notify the nurse for any further pain. Patient verbalized understanding and demonstrated using the call bell.   Will continue to monitor closely.   Earlie Lou

## 2014-04-27 NOTE — H&P (Signed)
History and Physical  Patient ID: Bill Dunn MRN: [id number], SOB: 10-22-1935 78 y.o. Date of Encounter: 04/27/2014, 3:25 AM  Primary Physician: Pcp Not In System Primary Cardiologist: Dr. Martinique  Chief Complaint: chest pain  HPI: 78 y.o. male w/ PMHx significant for nonobs CAD by cath 2014, HTN, PVD s/p AAA stenting, SVT, Crohn's disease who presented to [facility] on 04/27/2014 with complaints of chest pain. This presentation is almost exactly 1 year after a similar hospitalization for chest pain in which he underwent emergent stenting for apparent STEMI but ultimately felt to be pericarditis. Since that time, he reports having 5 episodes similar to the one that brought him in tonight. Tonight, was woken from sleep with sudden onset of chest pain, pressure in nature that was severe. Associated with profound shortness of breath and what he describes as an inability to call out for help. His family found him slumped over and gave him aspriin and SL nitro before he became more responsive and interactive. Upon presentation to the ER at [facility], still with lingering chest discomfort but this has resolved.  Denies LE edema, PND, orthopnea. Had sleep study done in the last year whch was negative for sleep apnea per patient. These episodes only occur at night. No chest pain during the day. Not very active but without exertional chest pain/pressure.  Reports blood pressure has risen over the last several months. Takes HCTZ qod due to the increase in urination while on this medication.  EKG revealed NSR with inf < 1 mm ST j point elevation, exactly the same as previous. CXR was without acute cardiopulmonary abnormalities. Labs are significant for nl troponin. Elevated Cr.   Past Medical History  Diagnosis Date  . Crohn disease     a. s/p multiple bowel resections;  b. s/p portacath.  Marland Kitchen HTN (hypertension)   . Hypertriglyceridemia   . AAA (abdominal aortic aneurysm)     a. s/p stent  grafting in 2012, in [place], [place], per pt.  Marland Kitchen TIA (transient ischemic attack)     a. x 2, last ~ 2002  . Pleuritic chest pain     a. s/p reportedly nl cath in 2012, [place], [place];  b. 04/2013 Cath: LM nl, LAD 36m, D1 nl, LCX nl, OM1 20, OM2 sm 14m, RCA 40p/m, 20d, EF 55-65%;  c. 04/2013 Echo: EF 55%, no rwma;  d. chest pain improved with colchicine.  . Bladder cancer   . Sepsis ? 2002  . Arthritis   . SVT (supraventricular tachycardia)     a. 04/2013->bb therapy.  . Pericarditis 04/13/2013     Surgical History:  Past Surgical History  Procedure Laterality Date  . Colon surgery      a. multiple bowel resections 2/2 chrohn's dzs.  . Abdominal aortic aneurysm repair      a. reported stenting 2012  . Appendectomy    . Renal cyst excision    . Inguinal hernia repair    . Spinal fusion    . Cochlear implant    . Left shoulder surgery    . Cholecystectomy    . Left heart catheterization with coronary angiogram Bilateral 04/13/2013    Procedure: LEFT HEART CATHETERIZATION WITH CORONARY ANGIOGRAM;  Surgeon: Peter M Martinique, MD;  Location: [facility] CATH LAB;  Service: Cardiovascular;  Laterality: Bilateral;     Home Meds: Prior to Admission medications   Medication Sig Start Date End Date Taking? Authorizing Provider  aspirin 81 MG chewable tablet Chew 324  mg by mouth once.   Yes Historical Provider, MD  colestipol (COLESTID) 1 G tablet Take 1 g by mouth 2 (two) times daily.   Yes Historical Provider, MD  diphenoxylate-atropine (LOMOTIL) 2.5-0.025 MG per tablet Take 2 tablets by mouth 2 (two) times daily as needed for diarrhea or loose stools.   Yes Historical Provider, MD  Fluticasone Furoate-Vilanterol (BREO ELLIPTA) 100-25 MCG/INH AEPB Inhale 1 puff into the lungs at bedtime. 12/05/13  Yes Historical Provider, MD  hydrochlorothiazide (HYDRODIURIL) 25 MG tablet Take 1 tablet (25 mg total) by mouth daily. Patient taking differently: Take 25 mg by mouth every other day.  04/28/13  Yes  Burtis Junes, NP  HYDROcodone-acetaminophen (NORCO/VICODIN) 5-325 MG per tablet Take 1 tablet by mouth every 6 (six) hours as needed for moderate pain.  04/10/14  Yes Historical Provider, MD  Lactobacillus Rhamnosus, GG, (CULTURELLE PO) Take 1 tablet by mouth 2 (two) times daily.   Yes Historical Provider, MD  metoprolol (TOPROL-XL) 200 MG 24 hr tablet Take 200 mg by mouth at bedtime.   Yes Historical Provider, MD  nitroGLYCERIN (NITROSTAT) 0.4 MG SL tablet Place 1 tablet (0.4 mg total) under the tongue every 5 (five) minutes x 3 doses as needed for chest pain. 04/14/13  Yes Rogelia Mire, NP  ondansetron (ZOFRAN) 4 MG tablet Take 4 mg by mouth daily as needed for nausea or vomiting.    Yes Historical Provider, MD  traZODone (DESYREL) 100 MG tablet Take 100-200 mg by mouth at bedtime as needed for sleep.   Yes Historical Provider, MD    Allergies:  Allergies  Allergen Reactions  . Amoxil [Amoxicillin] Other (See Comments)    REACTION: C-diff  . Augmentin [Amoxicillin-Pot Clavulanate] Other (See Comments)    REACTION: C-Diff  . Ciprofloxacin Diarrhea  . Demerol [Meperidine] Nausea And Vomiting  . Infliximab Nausea Only  . Morphine And Related Nausea And Vomiting  . Ace Inhibitors Cough    History   Social History  . Marital Status: Married    Spouse Name: N/A    Number of Children: N/A  . Years of Education: N/A   Occupational History  . Not on file.   Social History Main Topics  . Smoking status: Former Smoker    Quit date: 05/05/1991  . Smokeless tobacco: Former Systems developer  . Alcohol Use: No  . Drug Use: No  . Sexual Activity: Not Currently   Other Topics Concern  . Not on file   Social History Narrative   Lives in [place].  Does not routinely exercise.     Family History  Problem Relation Age of Onset  . CAD Father     died @ 58  . CAD Mother     died @ 42    Review of Systems: General: negative for chills, fever, night sweats or weight changes.   Cardiovascular: nsee HPI Dermatological: negative for rash Respiratory: negative for cough or wheezing Urologic: negative for hematuria Abdominal: negative for nausea, vomiting, diarrhea, bright red blood per rectum, melena, or hematemesis. Reports not in crohns flare. Neurologic: negative for visual changes, syncope, or dizziness All other systems reviewed and are otherwise negative except as noted above.  Labs:   Lab Results  Component Value Date   WBC 8.1 04/27/2014   HGB 13.6 04/27/2014   HCT 42.8 04/27/2014   MCV 85.9 04/27/2014   PLT 143* 04/27/2014    Recent Labs Lab 04/27/14 0040  NA 141  K 4.4  CL 105  CO2 25  BUN 31*  CREATININE 1.59*  CALCIUM 9.0  PROT 6.9  BILITOT 0.2*  ALKPHOS 51  ALT 50  AST 40*  GLUCOSE 113*    Recent Labs  04/27/14 0040  TROPONINI <0.03   Lab Results  Component Value Date   CHOL 159 04/14/2013   HDL 28* 04/14/2013   LDLCALC 51 04/14/2013   TRIG 400* 04/14/2013    Radiology/Studies:  Dg Chest Port 1 View  04/27/2014   CLINICAL DATA:  Shortness of breath and chest pain  EXAM: PORTABLE CHEST - 1 VIEW  COMPARISON:  Chest CT and radiograph 04/13/2013  FINDINGS: The heart size is moderately enlarged without evidence for edema. Left-sided Port-A-Cath tip terminates over the high right atrium. Both lungs are clear. The visualized skeletal structures are unremarkable.  IMPRESSION: Cardiomegaly without focal acute finding.   Electronically Signed   By: Conchita Paris M.D.   On: 04/27/2014 01:37     EKG: see HPI  Physical Exam: Blood pressure 146/82, pulse 65, temperature 98.8 F (37.1 C), temperature source Oral, resp. rate 14, height 5\' 8"  (1.727 m), weight 106.595 kg (235 lb), SpO2 95 %. General: Well developed, well nourished, in no acute distress. Head: Normocephalic, atraumatic, sclera non-icteric, nares are without discharge Neck: Supple. Negative for carotid bruits. JVD not elevated. Lungs: Clear bilaterally to  auscultation without wheezes, rales, or rhonchi. Breathing is unlabored. Heart: RRR with S1 S2. No murmurs, rubs, or gallops appreciated. Port a cath in place. Abdomen: Soft, non-tender, non-distended with normoactive bowel sounds. No rebound/guarding. No obvious abdominal masses. Msk:  Strength and tone appear normal for age. Extremities: No edema. No clubbing or cyanosis. Distal pedal pulses are 2+ and equal bilaterally. Neuro: Alert and oriented X 3. Moves all extremities spontaneously. Psych:  Responds to questions appropriately with a normal affect. Hard of hearing   Problem List 1. Chest pain: typical and atypical features 2. Nonobstructive CAD by cath 2014 3. HTN 4. AAA s/p repair 5. Crohns dz   ASSESSMENT AND PLAN:  78 y.o. male w/ PMHx significant for nonobs CAD by cath 2014, HTN, PVD s/p AAA stenting, SVT, Crohn's disease who presented to [facility] on 04/27/2014 with complaints of chest pain.  With risk factors of known nonobst CAD by cath, HTN, age and gender. Symptoms have typical (chest pressure, SOB) and atypical (unresponsiveness, only occurs at night) features. Encouragingly, his EKG is without ischemic changes and troponin is negative. Will admit to obs and rule out with serial troponins and followup EKG. If workup is negative, reasonable to get pharmacologic stress as inpatient or followup closely as outpatient for further risk stratification. Unfortunately, I don't have a much on the differential for his "spells" though consider esophageal spasm, sleep disorder, pleurisy.  HTN is not well controlled currently. Pt does not like his perceived increase in urination with HCTZ so will switch to amlodipine. Continue beta bocker.   Full code Ambulating NPO in case stress able to be done.  Signed, Elias Else, Wylan Gentzler C. MD 04/27/2014, 3:25 AM

## 2014-04-27 NOTE — Progress Notes (Signed)
-  pt had 23 beat run of vtach, asymptomatic. BP 153/68, HR 65, 96%O2 sat on RA, temp 98.8. Nurse notified Dr. Dorene Ar.  MD stated will place lab orders. Will continue to monitor pt closely   Bill Dunn I 04/27/2014 9:18 AM

## 2014-04-28 ENCOUNTER — Other Ambulatory Visit: Payer: Self-pay | Admitting: Cardiology

## 2014-04-28 ENCOUNTER — Observation Stay (HOSPITAL_COMMUNITY): Payer: Medicare Other

## 2014-04-28 DIAGNOSIS — R079 Chest pain, unspecified: Secondary | ICD-10-CM | POA: Diagnosis not present

## 2014-04-28 DIAGNOSIS — R000 Tachycardia, unspecified: Secondary | ICD-10-CM

## 2014-04-28 DIAGNOSIS — I4729 Other ventricular tachycardia: Secondary | ICD-10-CM

## 2014-04-28 DIAGNOSIS — I10 Essential (primary) hypertension: Secondary | ICD-10-CM

## 2014-04-28 DIAGNOSIS — I472 Ventricular tachycardia: Secondary | ICD-10-CM

## 2014-04-28 MED ORDER — MAGNESIUM OXIDE 400 (241.3 MG) MG PO TABS
400.0000 mg | ORAL_TABLET | Freq: Two times a day (BID) | ORAL | Status: DC
  Administered 2014-04-28: 400 mg via ORAL
  Filled 2014-04-28 (×2): qty 1

## 2014-04-28 MED ORDER — AMLODIPINE BESYLATE 10 MG PO TABS
10.0000 mg | ORAL_TABLET | Freq: Every day | ORAL | Status: DC
  Filled 2014-04-28: qty 1

## 2014-04-28 MED ORDER — AMLODIPINE BESYLATE 10 MG PO TABS: 10.0000 mg | ORAL_TABLET | Freq: Every day | ORAL | Status: AC

## 2014-04-28 MED ORDER — HEPARIN SOD (PORK) LOCK FLUSH 100 UNIT/ML IV SOLN
500.0000 [IU] | INTRAVENOUS | Status: AC | PRN
  Administered 2014-04-28: 500 [IU]

## 2014-04-28 MED ORDER — AMLODIPINE BESYLATE 5 MG PO TABS: 5.0000 mg | ORAL_TABLET | Freq: Once | ORAL | Status: DC

## 2014-04-28 MED ORDER — AMLODIPINE BESYLATE 5 MG PO TABS: 5.0000 mg | ORAL_TABLET | Freq: Every day | ORAL | Status: DC

## 2014-04-28 MED ORDER — AMLODIPINE BESYLATE 5 MG PO TABS
5.0000 mg | ORAL_TABLET | Freq: Once | ORAL | Status: AC
  Administered 2014-04-28: 5 mg via ORAL
  Filled 2014-04-28: qty 1

## 2014-04-28 MED ORDER — IOHEXOL 350 MG/ML SOLN
80.0000 mL | Freq: Once | INTRAVENOUS | Status: AC | PRN
  Administered 2014-04-28: 80 mL via INTRAVENOUS

## 2014-04-28 MED ORDER — ASPIRIN 81 MG PO TBEC: 81.0000 mg | DELAYED_RELEASE_TABLET | Freq: Every day | ORAL | Status: DC

## 2014-04-28 MED ORDER — MAGNESIUM OXIDE 400 (241.3 MG) MG PO TABS: 400.0000 mg | ORAL_TABLET | Freq: Two times a day (BID) | ORAL | Status: DC

## 2014-04-28 NOTE — Progress Notes (Signed)
Subjective: No complaints, no chest pain or SOB, no awareness of NSVT yest.  Objective: Vital signs in last 24 hours: Temp:  [98.1 F (36.7 C)-98.8 F (37.1 C)] 98.1 F (36.7 C) (12/26 0534) Pulse Rate:  [65-98] 72 (12/26 0534) Resp:  [16-18] 18 (12/26 0534) BP: (153-173)/(68-88) 170/86 mmHg (12/26 0534) SpO2:  [93 %-98 %] 93 % (12/26 0534) Weight change:  Last BM Date: 04/27/14 Intake/Output from previous day: -1275 12/25 0701 - 12/26 0700 In: 600 [P.O.:600] Out: 1875 [Urine:1875] Intake/Output this shift:    PE: General:Pleasant affect, NAD Skin:Warm and dry, brisk capillary refill HEENT:normocephalic, sclera clear, mucus membranes moist Heart:S1S2 RRR without murmur, gallup, rub or click Lungs:clear without rales, rhonchi, or wheezes JOI:TGPQ, non tender, + BS, do not palpate liver spleen or masses Ext:no lower ext edema, 2+ pedal pulses, 2+ radial pulses Neuro:alert and oriented X 3, MAE, follows commands, + facial symmetry   Lab Results:  Recent Labs  04/27/14 0040  WBC 8.1  HGB 13.6  HCT 42.8  PLT 143*   BMET  Recent Labs  04/27/14 0040 04/27/14 0629 04/27/14 1220  NA 141 142  --   K 4.4 4.8 4.4  CL 105 110  --   CO2 25 22  --   GLUCOSE 113* 96  --   BUN 31* 31*  --   CREATININE 1.59* 1.47*  --   CALCIUM 9.0 8.6  --     Recent Labs  04/27/14 1220 04/27/14 1625  TROPONINI <0.03 <0.03    Lab Results  Component Value Date   CHOL 162 04/27/2014   HDL 44 04/27/2014   LDLCALC 67 04/27/2014   TRIG 255* 04/27/2014   CHOLHDL 3.7 04/27/2014   Lab Results  Component Value Date   HGBA1C 5.1 04/13/2013     Lab Results  Component Value Date   TSH 1.670 04/13/2013    Hepatic Function Panel  Recent Labs  04/27/14 0040  PROT 6.9  ALBUMIN 3.8  AST 40*  ALT 50  ALKPHOS 51  BILITOT 0.2*    Recent Labs  04/27/14 0629  CHOL 162   No results for input(s): PROTIME in the last 72 hours.     Studies/Results: Dg Chest  Port 1 View  04/27/2014   CLINICAL DATA:  Shortness of breath and chest pain  EXAM: PORTABLE CHEST - 1 VIEW  COMPARISON:  Chest CT and radiograph 04/13/2013  FINDINGS: The heart size is moderately enlarged without evidence for edema. Left-sided Port-A-Cath tip terminates over the high right atrium. Both lungs are clear. The visualized skeletal structures are unremarkable.  IMPRESSION: Cardiomegaly without focal acute finding.   Electronically Signed   By: Conchita Paris M.D.   On: 04/27/2014 01:37    Medications: I have reviewed the patient's current medications. Scheduled Meds: . amLODipine  5 mg Oral Daily  . aspirin  324 mg Oral Once  . aspirin EC  81 mg Oral Daily  . metoprolol  200 mg Oral QHS  . sodium chloride  3 mL Intravenous Q12H   Continuous Infusions:  PRN Meds:.sodium chloride, HYDROcodone-acetaminophen, nitroGLYCERIN, sodium chloride, traZODone  Assessment/Plan:78 y.o. male w/ PMHx significant for nonobs CAD by cath 2014, HTN, PVD s/p AAA stenting, SVT, Crohn's disease who presented to [facility] on 04/27/2014 with complaints of chest pain. This presentation is almost exactly 1 year after a similar hospitalization for chest pain in which he underwent emergent stenting for apparent STEMI but ultimately  felt to be pericarditis.  Now here with chest pain, neg for MI.  + 23 beats of NSVT yesterday.  Mg+1.5   Principal Problem:   Chest pain neg MI, atypical possible d/c today Active Problems:   Crohn's disease- stable   HTN (hypertension)- elevated, amlodipine added yesterday    Hypertriglyceridemia   History of AAA (abdominal aortic aneurysm) repair   NSVT (nonsustained ventricular tachycardia)- 23 beats yesterday, will add po Mag. Oxide. ? Need for cardionet? On 200 mg BB daily    LOS: 1 day   Time spent with pt. : 15 minutes. [facility] R  Nurse Practitioner Certified Pager [phone] or after 5pm and on weekends call [phone] 04/28/2014, 7:43 AM   Agree with  note written by Cecilie Kicks RNP  Enz neg. No further CP. Exam benign. NSVT. Mg replaced. On BB. OK for DC home. ROV with Dr. Martinique. Will get a 30 day event monitor as OP.  Lorretta Harp 04/28/2014 8:32 AM

## 2014-04-28 NOTE — Discharge Summary (Signed)
Physician Discharge Summary       Patient ID: Bill Dunn MRN: [id number] DOB/AGE: 09/06/1935 78 y.o.  Admit date: 04/27/2014 Discharge date: 04/28/2014   Primary Cardiologist:Dr. Martinique   Discharge Diagnoses:  Principal Problem:   Chest pain, negative MI  Active Problems:   NSVT (nonsustained ventricular tachycardia)   Crohn's disease   HTN (hypertension)   Hypertriglyceridemia   History of AAA (abdominal aortic aneurysm) repair   Discharged Condition: good  Procedures: none  Hospital Course:  78 y.o. male w/ PMHx significant for nonobs CAD by cath 2014, HTN, PVD s/p AAA stenting, SVT, Crohn's disease who presented to [facility] on 04/27/2014 with complaints of chest pain. This presentation is almost exactly 1 year after a similar hospitalization for chest pain in which he underwent emergent stenting for apparent STEMI but ultimately felt to be pericarditis. Since that time, he reports having 5 episodes similar to the one that brought him in tonight. He was woken from sleep with sudden onset of chest pain day of admit, pressure in nature that was severe. Associated with profound shortness of breath and what he describes as an inability to call out for help. His family found him slumped over and gave him aspriin and SL nitro before he became more responsive and interactive. Upon presentation to the ER at [facility], still with lingering chest discomfort but this has resolved.  Denies LE edema, PND, orthopnea. Had sleep study done in the last year whch was negative for sleep apnea per patient. These episodes only occur at night. No chest pain during the day. Not very active but without exertional chest pain/pressure.  Reports blood pressure has risen over the last several months. Takes HCTZ qod due to the increase in urination while on this medication.  This was changed to amlodipine here in hospital but with continued BP elevation was discharged on both.   EKG revealed  NSR with inf < 1 mm ST j point elevation, exactly the same as previous. CXR was without acute cardiopulmonary abnormalities. Labs are significant for nl troponin. Elevated Cr. Troponin was negative X 3.  Pt had no further chest pain.  He did have episode of NSVT 23 beats though he was not aware of this and was without symptoms.  His mag was low at 1.5 and Mag. Oxide added for 1 week.  Labs will be checked as outpt.  Pt is on BB.    On the 26th pt seen and found to be stable by Dr. Gwenlyn Found.  We will arrange an outpt 30 day event monitor for the pt.       Consults: None  Significant Diagnostic Studies:  BMET    Component Value Date/Time   NA 142 04/27/2014 0629   K 4.4 04/27/2014 1220   CL 110 04/27/2014 0629   CO2 22 04/27/2014 0629   GLUCOSE 96 04/27/2014 0629   BUN 31* 04/27/2014 0629   CREATININE 1.47* 04/27/2014 0629   CALCIUM 8.6 04/27/2014 0629   GFRNONAA 45* 04/27/2014 0629   GFRAA 52* 04/27/2014 0629    CBC    Component Value Date/Time   WBC 8.1 04/27/2014 0040   RBC 4.98 04/27/2014 0040   HGB 13.6 04/27/2014 0040   HCT 42.8 04/27/2014 0040   PLT 143* 04/27/2014 0040   MCV 85.9 04/27/2014 0040   MCH 27.3 04/27/2014 0040   MCHC 31.8 04/27/2014 0040   RDW 14.3 04/27/2014 0040   LYMPHSABS 2.2 04/27/2014 0040   MONOABS 0.8 04/27/2014 0040  EOSABS 0.2 04/27/2014 0040   BASOSABS 0.1 04/27/2014 0040     troponin <0.03 X 4  Lipid Panel     Component Value Date/Time   CHOL 162 04/27/2014 0629   TRIG 255* 04/27/2014 0629   HDL 44 04/27/2014 0629   CHOLHDL 3.7 04/27/2014 0629   VLDL 51* 04/27/2014 0629   LDLCALC 67 04/27/2014 0629   Hepatic Function Latest Ref Rng 04/27/2014 04/13/2013  Total Protein 6.0 - 8.3 g/dL 6.9 6.8  Albumin 3.5 - 5.2 g/dL 3.8 3.4(L)  AST 0 - 37 U/L 40(H) 31  ALT 0 - 53 U/L 50 36  Alk Phosphatase 39 - 117 U/L 51 58  Total Bilirubin 0.3 - 1.2 mg/dL 0.2(L) 0.3   ddimer 2.21  CT of chest:  FINDINGS: Negative for pulmonary embolism.  Patient has a left subclavian Port-A-Cath. Catheter tip near the junction of the right atrium and superior vena cava. There are coronary artery calcifications. No evidence for chest lymphadenopathy. No significant pericardial or pleural fluid. No acute abnormality in the upper abdomen.  The proximal descending thoracic aorta measures 4.0 cm and unchanged. Ascending thoracic aorta roughly measures 3.5 cm and stable. No evidence to suggest an aortic dissection. Mild atherosclerotic disease in the aorta and proximal great vessels.  The trachea and mainstem bronchi are patent. Stable pleural-based densities along the right middle lobe on sequence 406, image 60. Persistent area of pleural thickening and nodularity along the right minor fissure on sequence 406, image 50 that roughly measures 7 mm. This is likely unchanged but the previous examination had motion artifact and difficult to evaluate this area. There is also a questionable small nodule in the right middle lobe on image 48. This may have been present on the previous examination. This punctate nodule roughly measures 4 mm. There is a peripheral nodule in the left lower lobe on sequence 406, image 75 which measures 6 mm. This nodule appears to be new. No large areas of consolidation. There is volume loss in the right lower lobe which appears to be chronic.  Degenerative changes along the inferior left glenohumeral joint. No acute bone abnormality.  Review of the MIP images confirms the above findings.  IMPRESSION: No evidence for pulmonary embolism.  No large areas of airspace disease or consolidation.  Few pulmonary nodules as described. Nodules in the right lung are likely stable. There is concern for a new 6 mm nodule in the left lower lobe and recommend follow-up. If the patient is at high risk for bronchogenic carcinoma, follow-up chest CT at 6-12 months is recommended. If the patient is at low risk for  bronchogenic carcinoma, follow-up chest CT at 12 months is recommended. This recommendation follows the consensus statement: Guidelines for Management of Small Pulmonary Nodules Detected on CT Scans: A Statement from the [place] as published in Radiology 2005;237:395-400.  Stable enlargement of the proximal descending thoracic aorta, measuring up to 4.0 cm. Discharge Exam: Blood pressure 170/86, pulse 72, temperature 98.1 F (36.7 C), temperature source Oral, resp. rate 18, height 5' 8"  (1.727 m), weight 255 lb 14.4 oz (116.075 kg), SpO2 93 %.   Disposition: 01-Home or Self Care     Medication List    STOP taking these medications        aspirin 81 MG chewable tablet  Replaced by:  aspirin 81 MG EC tablet      TAKE these medications        amLODipine 5 MG tablet  Commonly known as:  NORVASC  Take 1 tablet (5 mg total) by mouth daily.     aspirin 81 MG EC tablet  Take 1 tablet (81 mg total) by mouth daily.     BREO ELLIPTA 100-25 MCG/INH Aepb  Generic drug:  Fluticasone Furoate-Vilanterol  Inhale 1 puff into the lungs at bedtime.     COLESTID 1 G tablet  Generic drug:  colestipol  Take 1 g by mouth 2 (two) times daily.     CULTURELLE PO  Take 1 tablet by mouth 2 (two) times daily.     diphenoxylate-atropine 2.5-0.025 MG per tablet  Commonly known as:  LOMOTIL  Take 2 tablets by mouth 2 (two) times daily as needed for diarrhea or loose stools.     hydrochlorothiazide 25 MG tablet  Commonly known as:  HYDRODIURIL  Take 1 tablet (25 mg total) by mouth daily.     HYDROcodone-acetaminophen 5-325 MG per tablet  Commonly known as:  NORCO/VICODIN  Take 1 tablet by mouth every 6 (six) hours as needed for moderate pain.     magnesium oxide 400 (241.3 MG) MG tablet  Commonly known as:  MAG-OX  Take 1 tablet (400 mg total) by mouth 2 (two) times daily.     metoprolol 200 MG 24 hr tablet  Commonly known as:  TOPROL-XL  Take 200 mg by mouth at bedtime.       nitroGLYCERIN 0.4 MG SL tablet  Commonly known as:  NITROSTAT  Place 1 tablet (0.4 mg total) under the tongue every 5 (five) minutes x 3 doses as needed for chest pain.     ondansetron 4 MG tablet  Commonly known as:  ZOFRAN  Take 4 mg by mouth daily as needed for nausea or vomiting.     traZODone 100 MG tablet  Commonly known as:  DESYREL  Take 100-200 mg by mouth at bedtime as needed for sleep.       Follow-up Information    Follow up with [name], MD.   Specialty:  Cardiology   Why:  the office will call with date and time  we will also arrange for you to have an event monitor to wear to eval. your heart rate to see if causing symptoms    Contact information:   [address] [place] [address] [phone]        Discharge Instructions: We will arrange for you to wear a monitor.  Have labs done Jan. 4th on first floor of Dr. Doug Sou office at lab.   Heart Healthy diet.  Your BP is still elevated with amlodipine, continue and continue the hydrodiuril as well as toprol  Call the office if problems or questions.   Signed: Isaiah Serge Nurse Practitioner-Certified [facility]: [facility] 04/28/2014, 9:24 AM  Time spent on discharge : >30  minutes.

## 2014-04-28 NOTE — Discharge Instructions (Signed)
We will arrange for you to wear a monitor.  Have labs done Jan. 4th on first floor of Dr. Doug Sou office at lab.   Heart Healthy diet.  Your BP is still elevated with amlodipine, continue and continue the hydrodiuril as well as toprol  Call the office if problems or questions.

## 2014-04-28 NOTE — Progress Notes (Signed)
Pt had elevated ddimer and is undergoing CT scan to rule out PE.  His daughter is concerned about his BP has been elevated for several months but his PCP is following.  I will increase amlodipine to 10 mg daily but also continue his HCTZ every other day.      Lorretta Harp, M.D., [place], [facility], [place] [address] [place], [place]  [address]  [phone] 04/28/2014 5:08 PM

## 2014-04-30 ENCOUNTER — Telehealth: Payer: Self-pay | Admitting: Cardiology

## 2014-04-30 NOTE — Telephone Encounter (Signed)
Closed encounter °

## 2014-05-07 ENCOUNTER — Other Ambulatory Visit (INDEPENDENT_AMBULATORY_CARE_PROVIDER_SITE_OTHER): Payer: Medicare Other | Admitting: *Deleted

## 2014-05-07 ENCOUNTER — Encounter (INDEPENDENT_AMBULATORY_CARE_PROVIDER_SITE_OTHER): Payer: Medicare Other

## 2014-05-07 ENCOUNTER — Encounter: Payer: Self-pay | Admitting: *Deleted

## 2014-05-07 DIAGNOSIS — I10 Essential (primary) hypertension: Secondary | ICD-10-CM

## 2014-05-07 DIAGNOSIS — I472 Ventricular tachycardia: Secondary | ICD-10-CM

## 2014-05-07 DIAGNOSIS — I4729 Other ventricular tachycardia: Secondary | ICD-10-CM

## 2014-05-07 DIAGNOSIS — R000 Tachycardia, unspecified: Secondary | ICD-10-CM

## 2014-05-07 LAB — BASIC METABOLIC PANEL
BUN: 36 mg/dL — ABNORMAL HIGH (ref 6–23)
CO2: 24 mEq/L (ref 19–32)
Calcium: 8.9 mg/dL (ref 8.4–10.5)
Chloride: 103 mEq/L (ref 96–112)
Creatinine, Ser: 1.7 mg/dL — ABNORMAL HIGH (ref 0.4–1.5)
GFR: 42.69 mL/min — ABNORMAL LOW (ref >60.00)
Glucose, Bld: 104 mg/dL — ABNORMAL HIGH (ref 70–99)
Potassium: 4.6 mEq/L (ref 3.5–5.1)
Sodium: 137 mEq/L (ref 135–145)

## 2014-05-07 LAB — MAGNESIUM: MAGNESIUM: 1.7 mg/dL (ref 1.5–2.5)

## 2014-05-07 NOTE — Progress Notes (Signed)
Patient ID: Bill Dunn, male   DOB: 1936/03/25, 79 y.o.   MRN: [id number] [name] 30 day cardiac event monitor applied to patient.

## 2014-05-23 ENCOUNTER — Telehealth: Payer: Self-pay | Admitting: *Deleted

## 2014-05-23 NOTE — Telephone Encounter (Signed)
Received notification from [name] on 05/19/2014 informing us they have not received an ECG transmission for 2 days.  Bill Dunn explained, monitor was not working, [name] had to send out a new part,  which, was received 05/18/14.  I let her know an ECG transmission was recorded 05/20/2014, so it appears everything working at this time.

## 2014-05-30 ENCOUNTER — Encounter: Payer: Self-pay | Admitting: Cardiology

## 2014-05-30 ENCOUNTER — Ambulatory Visit (INDEPENDENT_AMBULATORY_CARE_PROVIDER_SITE_OTHER): Payer: Medicare Other | Admitting: Cardiology

## 2014-05-30 VITALS — BP 122/82 | HR 72 | Ht 68.0 in | Wt 249.9 lb

## 2014-05-30 DIAGNOSIS — I10 Essential (primary) hypertension: Secondary | ICD-10-CM

## 2014-05-30 DIAGNOSIS — I4729 Other ventricular tachycardia: Secondary | ICD-10-CM

## 2014-05-30 DIAGNOSIS — I472 Ventricular tachycardia: Secondary | ICD-10-CM

## 2014-05-30 DIAGNOSIS — R079 Chest pain, unspecified: Secondary | ICD-10-CM

## 2014-05-30 NOTE — Patient Instructions (Signed)
Continue your current therapy  I will see you as needed. 

## 2014-05-31 NOTE — Progress Notes (Signed)
Bill Dunn Date of Birth: Dec 05, 1935 Medical Record #[id number]  History of Present Illness: Bill Dunn is seen for follow up after hospitalization for chest pain. He has a history of HTN, hypertriglyceridemia, AAA repair with stenting, prior TIA, who was admitted on Xmas day with chest pain and acute dyspnea. His family actually found him slumped over and called EMS. He had no new Ecg changes. troponins were normal. He had one episode of NSVT that was asymptomatic. His magnesium was repleted. He was discharged the following day. CT of the chest showed no evidence of PE. The aorta measured 4.0 cm. He is seen with his daughter today. He did wear an event monitor but has no symptoms of chest pain, palpitations, or dyspnea. He is getting ready to travel back to [facility] for the winter.   Current Outpatient Prescriptions  Medication Sig Dispense Refill  . amLODipine (NORVASC) 10 MG tablet Take 1 tablet (10 mg total) by mouth daily. 30 tablet 6  . aspirin EC 81 MG EC tablet Take 1 tablet (81 mg total) by mouth daily.    . colestipol (COLESTID) 1 G tablet Take 1 g by mouth 2 (two) times daily.    . diphenoxylate-atropine (LOMOTIL) 2.5-0.025 MG per tablet Take 2 tablets by mouth 2 (two) times daily as needed for diarrhea or loose stools.    . Fluticasone Furoate-Vilanterol (BREO ELLIPTA) 100-25 MCG/INH AEPB Inhale 1 puff into the lungs at bedtime.    . hydrochlorothiazide (HYDRODIURIL) 25 MG tablet Take 1 tablet (25 mg total) by mouth daily. (Patient taking differently: Take 25 mg by mouth every other day. ) 30 tablet 6  . HYDROcodone-acetaminophen (NORCO/VICODIN) 5-325 MG per tablet Take 1 tablet by mouth every 6 (six) hours as needed for moderate pain.     . Lactobacillus Rhamnosus, GG, (CULTURELLE PO) Take 1 tablet by mouth 2 (two) times daily.    . metoprolol (TOPROL-XL) 200 MG 24 hr tablet Take 200 mg by mouth at bedtime.    . nitroGLYCERIN (NITROSTAT) 0.4 MG SL tablet Place 1 tablet (0.4 mg total)  under the tongue every 5 (five) minutes x 3 doses as needed for chest pain. 25 tablet 3  . ondansetron (ZOFRAN) 4 MG tablet Take 4 mg by mouth daily as needed for nausea or vomiting.     . traZODone (DESYREL) 100 MG tablet Take 100-200 mg by mouth at bedtime as needed for sleep.     No current facility-administered medications for this visit.    Allergies  Allergen Reactions  . Amoxil [Amoxicillin] Other (See Comments)    REACTION: C-diff  . Augmentin [Amoxicillin-Pot Clavulanate] Other (See Comments)    REACTION: C-Diff  . Ciprofloxacin Diarrhea  . Demerol [Meperidine] Nausea And Vomiting  . Infliximab Nausea Only  . Morphine And Related Nausea And Vomiting  . Ace Inhibitors Cough    Past Medical History  Diagnosis Date  . Crohn disease     a. s/p multiple bowel resections;  b. s/p portacath.  Marland Kitchen HTN (hypertension)   . Hypertriglyceridemia   . AAA (abdominal aortic aneurysm)     a. s/p stent grafting in 2012, in [place], [place], per pt.  Marland Kitchen TIA (transient ischemic attack)     a. x 2, last ~ 2002  . Pleuritic chest pain     a. s/p reportedly nl cath in 2012, [place], [place];  b. 04/2013 Cath: LM nl, LAD 33m, D1 nl, LCX nl, OM1 20, OM2 sm 79m, RCA 40p/m, 20d, EF  55-65%;  c. 04/2013 Echo: EF 55%, no rwma;  d. chest pain improved with colchicine.  . Bladder cancer   . Sepsis ? 2002  . Arthritis   . SVT (supraventricular tachycardia)     a. 04/2013->bb therapy.  . Pericarditis 04/13/2013    Past Surgical History  Procedure Laterality Date  . Colon surgery      a. multiple bowel resections 2/2 chrohn's dzs.  . Abdominal aortic aneurysm repair      a. reported stenting 2012  . Appendectomy    . Renal cyst excision    . Inguinal hernia repair    . Spinal fusion    . Cochlear implant    . Left shoulder surgery    . Cholecystectomy    . Left heart catheterization with coronary angiogram Bilateral 04/13/2013    Procedure: LEFT HEART CATHETERIZATION WITH CORONARY  ANGIOGRAM;  Surgeon: Jessika Rothery M Martinique, MD;  Location: [facility] CATH LAB;  Service: Cardiovascular;  Laterality: Bilateral;    History  Smoking status  . Former Smoker  . Quit date: 05/05/1991  Smokeless tobacco  . Former User    History  Alcohol Use No    Family History  Problem Relation Age of Onset  . CAD Father     died @ 94  . CAD Mother     died @ 52    Review of Systems: The review of systems is per the HPI.  All other systems were reviewed and are negative.  Physical Exam: BP 122/82 mmHg  Pulse 72  Ht 5\' 8"  (1.727 m)  Wt 249 lb 14.4 oz (113.354 kg)  BMI 38.01 kg/m2 Patient is very pleasant and in no acute distress. Little hard of hearing. Skin is warm and dry. Color is normal.  HEENT is unremarkable. Normocephalic/atraumatic. PERRL. Sclera are nonicteric. Neck is supple. No masses. No JVD. Lungs are clear. Cardiac exam shows a regular rate and rhythm. Abdomen is soft. Extremities are without edema. Gait and ROM are intact. No gross neurologic deficits noted.  Wt Readings from Last 3 Encounters:  05/30/14 249 lb 14.4 oz (113.354 kg)  04/27/14 255 lb 14.4 oz (116.075 kg)  05/16/13 230 lb 6.4 oz (104.509 kg)     LABORATORY DATA:   Event monitor with several recordings: all NSR  Lab Results  Component Value Date   WBC 8.1 04/27/2014   HGB 13.6 04/27/2014   HCT 42.8 04/27/2014   PLT 143* 04/27/2014   GLUCOSE 104* 05/07/2014   CHOL 162 04/27/2014   TRIG 255* 04/27/2014   HDL 44 04/27/2014   LDLCALC 67 04/27/2014   ALT 50 04/27/2014   AST 40* 04/27/2014   NA 137 05/07/2014   K 4.6 05/07/2014   CL 103 05/07/2014   CREATININE 1.7* 05/07/2014   BUN 36* 05/07/2014   CO2 24 05/07/2014   TSH 1.670 04/13/2013   INR 0.94 04/13/2013   HGBA1C 5.1 04/13/2013   Assessment / Plan:  1. History of chest pain with negative hospital evaluation. Prior cath in 2014 showed nonobstructive disease. No further work up indicated.  2. NSVT - on beta blocker - Event monitor  reviewed today and showed no arrhythmia.   3. S/P cardiac cath - nonobstructive CAD noted - to manage medically   4. HTN - BP is controlled.   5. HLD   6. Noted dilation of the descending thoracic aorta -  repeat study stable at 4.0 cm.  7. Probably OSA - unable to get the sleep study due  to problems on our end  8. Microalbuminuria - probably could benefit from ARB therapy - will defer to his PCP once he gets to [place].  I will follow up Prn.

## 2014-06-12 ENCOUNTER — Telehealth: Payer: Self-pay

## 2014-06-12 NOTE — Telephone Encounter (Signed)
Patient called no answer.Left message to call back for monitor results.

## 2014-06-13 ENCOUNTER — Telehealth: Payer: Self-pay

## 2014-06-13 NOTE — Telephone Encounter (Signed)
Received a call from patient's wife Dr.Jordan reviewed monitor revealed normal sinus rhythm,no ectopy.Stated they are in [place] and would like appointment to see Dr.Jordan when he gets back home.Follow up appointment scheduled with Dr.Jordan 08/28/14 at 2:30 pm.

## 2014-08-28 ENCOUNTER — Ambulatory Visit: Payer: Medicare Other | Admitting: Cardiology

## 2016-05-01 ENCOUNTER — Observation Stay (HOSPITAL_COMMUNITY): Payer: Medicare Other

## 2016-05-01 ENCOUNTER — Observation Stay (HOSPITAL_COMMUNITY)
Admission: EM | Admit: 2016-05-01 | Discharge: 2016-05-02 | Disposition: A | Discharge status: Home / Self Care | Payer: Medicare Other | Attending: Family Medicine | Admitting: Family Medicine

## 2016-05-01 ENCOUNTER — Encounter (HOSPITAL_COMMUNITY): Payer: Self-pay | Admitting: Emergency Medicine

## 2016-05-01 ENCOUNTER — Emergency Department (HOSPITAL_COMMUNITY): Payer: Medicare Other

## 2016-05-01 DIAGNOSIS — Y939 Activity, unspecified: Secondary | ICD-10-CM | POA: Insufficient documentation

## 2016-05-01 DIAGNOSIS — R0789 Other chest pain: Principal | ICD-10-CM | POA: Insufficient documentation

## 2016-05-01 DIAGNOSIS — Z8551 Personal history of malignant neoplasm of bladder: Secondary | ICD-10-CM | POA: Insufficient documentation

## 2016-05-01 DIAGNOSIS — I2510 Atherosclerotic heart disease of native coronary artery without angina pectoris: Secondary | ICD-10-CM | POA: Insufficient documentation

## 2016-05-01 DIAGNOSIS — R072 Precordial pain: Secondary | ICD-10-CM | POA: Diagnosis present

## 2016-05-01 DIAGNOSIS — E784 Other hyperlipidemia: Secondary | ICD-10-CM | POA: Diagnosis not present

## 2016-05-01 DIAGNOSIS — W1830XA Fall on same level, unspecified, initial encounter: Secondary | ICD-10-CM | POA: Diagnosis not present

## 2016-05-01 DIAGNOSIS — K5090 Crohn's disease, unspecified, without complications: Secondary | ICD-10-CM | POA: Diagnosis present

## 2016-05-01 DIAGNOSIS — I129 Hypertensive chronic kidney disease with stage 1 through stage 4 chronic kidney disease, or unspecified chronic kidney disease: Secondary | ICD-10-CM | POA: Diagnosis not present

## 2016-05-01 DIAGNOSIS — N183 Chronic kidney disease, stage 3 (moderate): Secondary | ICD-10-CM | POA: Insufficient documentation

## 2016-05-01 DIAGNOSIS — Z87891 Personal history of nicotine dependence: Secondary | ICD-10-CM | POA: Diagnosis not present

## 2016-05-01 DIAGNOSIS — E7849 Other hyperlipidemia: Secondary | ICD-10-CM | POA: Insufficient documentation

## 2016-05-01 DIAGNOSIS — M25511 Pain in right shoulder: Secondary | ICD-10-CM | POA: Insufficient documentation

## 2016-05-01 DIAGNOSIS — I4729 Other ventricular tachycardia: Secondary | ICD-10-CM

## 2016-05-01 DIAGNOSIS — Z8673 Personal history of transient ischemic attack (TIA), and cerebral infarction without residual deficits: Secondary | ICD-10-CM

## 2016-05-01 DIAGNOSIS — Z79899 Other long term (current) drug therapy: Secondary | ICD-10-CM | POA: Diagnosis not present

## 2016-05-01 DIAGNOSIS — Y929 Unspecified place or not applicable: Secondary | ICD-10-CM | POA: Diagnosis not present

## 2016-05-01 DIAGNOSIS — K219 Gastro-esophageal reflux disease without esophagitis: Secondary | ICD-10-CM

## 2016-05-01 DIAGNOSIS — Z7982 Long term (current) use of aspirin: Secondary | ICD-10-CM | POA: Diagnosis not present

## 2016-05-01 DIAGNOSIS — W19XXXA Unspecified fall, initial encounter: Secondary | ICD-10-CM

## 2016-05-01 DIAGNOSIS — Z955 Presence of coronary angioplasty implant and graft: Secondary | ICD-10-CM | POA: Diagnosis not present

## 2016-05-01 DIAGNOSIS — I472 Ventricular tachycardia: Secondary | ICD-10-CM

## 2016-05-01 DIAGNOSIS — R079 Chest pain, unspecified: Secondary | ICD-10-CM | POA: Diagnosis not present

## 2016-05-01 DIAGNOSIS — M25551 Pain in right hip: Secondary | ICD-10-CM | POA: Diagnosis not present

## 2016-05-01 DIAGNOSIS — M25561 Pain in right knee: Secondary | ICD-10-CM | POA: Insufficient documentation

## 2016-05-01 DIAGNOSIS — Z9889 Other specified postprocedural states: Secondary | ICD-10-CM

## 2016-05-01 DIAGNOSIS — I10 Essential (primary) hypertension: Secondary | ICD-10-CM | POA: Diagnosis present

## 2016-05-01 DIAGNOSIS — Y999 Unspecified external cause status: Secondary | ICD-10-CM | POA: Diagnosis not present

## 2016-05-01 DIAGNOSIS — I471 Supraventricular tachycardia: Secondary | ICD-10-CM | POA: Diagnosis not present

## 2016-05-01 DIAGNOSIS — E781 Pure hyperglyceridemia: Secondary | ICD-10-CM | POA: Diagnosis present

## 2016-05-01 DIAGNOSIS — C679 Malignant neoplasm of bladder, unspecified: Secondary | ICD-10-CM

## 2016-05-01 DIAGNOSIS — I319 Disease of pericardium, unspecified: Secondary | ICD-10-CM | POA: Diagnosis present

## 2016-05-01 HISTORY — DX: Difficulty in walking, not elsewhere classified: R26.2

## 2016-05-01 HISTORY — DX: Chronic kidney disease, stage 3 unspecified: N18.30

## 2016-05-01 HISTORY — DX: Morbid (severe) obesity due to excess calories: E66.01

## 2016-05-01 HISTORY — DX: Chronic kidney disease, stage 3 (moderate): N18.3

## 2016-05-01 HISTORY — DX: Unspecified chronic bronchitis: J42

## 2016-05-01 HISTORY — DX: Solitary pulmonary nodule: R91.1

## 2016-05-01 HISTORY — DX: Dysphagia, unspecified: R13.10

## 2016-05-01 LAB — BASIC METABOLIC PANEL
ANION GAP: 10 (ref 5–15)
BUN: 24 mg/dL — AB (ref 6–20)
CHLORIDE: 100 mmol/L — AB (ref 101–111)
CO2: 27 mmol/L (ref 22–32)
Calcium: 8.2 mg/dL — ABNORMAL LOW (ref 8.9–10.3)
Creatinine, Ser: 1.77 mg/dL — ABNORMAL HIGH (ref 0.61–1.24)
GFR calc Af Amer: 41 mL/min — ABNORMAL LOW (ref >60)
GFR calc non Af Amer: 35 mL/min — ABNORMAL LOW (ref >60)
GLUCOSE: 150 mg/dL — AB (ref 65–99)
POTASSIUM: 4.7 mmol/L (ref 3.5–5.1)
Sodium: 137 mmol/L (ref 135–145)

## 2016-05-01 LAB — I-STAT CHEM 8, ED
BUN: 27 mg/dL — AB (ref 6–20)
CALCIUM ION: 1.07 mmol/L — AB (ref 1.15–1.40)
CHLORIDE: 101 mmol/L (ref 101–111)
Creatinine, Ser: 1.7 mg/dL — ABNORMAL HIGH (ref 0.61–1.24)
GLUCOSE: 144 mg/dL — AB (ref 65–99)
HEMATOCRIT: 40 % (ref 39.0–52.0)
HEMOGLOBIN: 13.6 g/dL (ref 13.0–17.0)
POTASSIUM: 4.7 mmol/L (ref 3.5–5.1)
SODIUM: 138 mmol/L (ref 135–145)
TCO2: 28 mmol/L (ref 0–100)

## 2016-05-01 LAB — I-STAT TROPONIN, ED: Troponin i, poc: 0.02 ng/mL (ref 0.00–0.08)

## 2016-05-01 LAB — CBC WITH DIFFERENTIAL/PLATELET
BASOS ABS: 0 10*3/uL (ref 0.0–0.1)
Basophils Relative: 0 %
EOS PCT: 0 %
Eosinophils Absolute: 0 10*3/uL (ref 0.0–0.7)
HEMATOCRIT: 41.4 % (ref 39.0–52.0)
Hemoglobin: 12.9 g/dL — ABNORMAL LOW (ref 13.0–17.0)
LYMPHS ABS: 1.6 10*3/uL (ref 0.7–4.0)
LYMPHS PCT: 17 %
MCH: 27.1 pg (ref 26.0–34.0)
MCHC: 31.2 g/dL (ref 30.0–36.0)
MCV: 87 fL (ref 78.0–100.0)
Monocytes Absolute: 0.4 10*3/uL (ref 0.1–1.0)
Monocytes Relative: 4 %
NEUTROS ABS: 7.4 10*3/uL (ref 1.7–7.7)
Neutrophils Relative %: 79 %
PLATELETS: 92 10*3/uL — AB (ref 150–400)
RBC: 4.76 MIL/uL (ref 4.22–5.81)
RDW: 14.7 % (ref 11.5–15.5)
WBC: 9.4 10*3/uL (ref 4.0–10.5)

## 2016-05-01 LAB — TROPONIN I: Troponin I: 0.03 ng/mL (ref <0.03)

## 2016-05-01 LAB — HEMOGLOBIN A1C
HEMOGLOBIN A1C: 6.5 % — AB (ref 4.8–5.6)
Mean Plasma Glucose: 140 mg/dL

## 2016-05-01 LAB — LIPID PANEL
CHOL/HDL RATIO: 2.1 ratio
Cholesterol: 119 mg/dL (ref 0–200)
HDL: 58 mg/dL (ref >40)
LDL Cholesterol: 35 mg/dL (ref 0–99)
Triglycerides: 128 mg/dL (ref <150)
VLDL: 26 mg/dL (ref 0–40)

## 2016-05-01 LAB — TSH: TSH: 0.505 u[IU]/mL (ref 0.350–4.500)

## 2016-05-01 MED ORDER — COLESTIPOL HCL 1 G PO TABS
1.0000 g | ORAL_TABLET | Freq: Two times a day (BID) | ORAL | Status: DC
  Administered 2016-05-01 – 2016-05-02 (×2): 1 g via ORAL
  Filled 2016-05-01 (×4): qty 1

## 2016-05-01 MED ORDER — AMLODIPINE BESYLATE 10 MG PO TABS
10.0000 mg | ORAL_TABLET | Freq: Every day | ORAL | Status: DC
  Administered 2016-05-01 – 2016-05-02 (×2): 10 mg via ORAL
  Filled 2016-05-01: qty 1
  Filled 2016-05-01: qty 2

## 2016-05-01 MED ORDER — FENTANYL CITRATE (PF) 100 MCG/2ML IJ SOLN
50.0000 ug | Freq: Once | INTRAMUSCULAR | Status: AC
  Administered 2016-05-01: 50 ug via INTRAVENOUS
  Filled 2016-05-01: qty 2

## 2016-05-01 MED ORDER — ONDANSETRON HCL 4 MG/2ML IJ SOLN
4.0000 mg | Freq: Once | INTRAMUSCULAR | Status: AC
  Administered 2016-05-01: 4 mg via INTRAVENOUS
  Filled 2016-05-01: qty 2

## 2016-05-01 MED ORDER — FENTANYL CITRATE (PF) 100 MCG/2ML IJ SOLN
25.0000 ug | INTRAMUSCULAR | Status: DC | PRN
  Administered 2016-05-01 – 2016-05-02 (×2): 25 ug via INTRAVENOUS
  Filled 2016-05-01 (×2): qty 2

## 2016-05-01 MED ORDER — NITROGLYCERIN 0.4 MG SL SUBL: 0.4000 mg | SUBLINGUAL_TABLET | SUBLINGUAL | Status: DC | PRN

## 2016-05-01 MED ORDER — ONDANSETRON HCL 4 MG/2ML IJ SOLN: 4.0000 mg | Freq: Four times a day (QID) | INTRAMUSCULAR | Status: DC | PRN

## 2016-05-01 MED ORDER — METOPROLOL SUCCINATE ER 100 MG PO TB24
200.0000 mg | ORAL_TABLET | Freq: Every day | ORAL | Status: DC
  Administered 2016-05-01: 200 mg via ORAL
  Filled 2016-05-01: qty 2

## 2016-05-01 MED ORDER — GI COCKTAIL ~~LOC~~: 30.0000 mL | Freq: Four times a day (QID) | ORAL | Status: DC | PRN

## 2016-05-01 MED ORDER — SODIUM CHLORIDE 0.9% FLUSH: 10.0000 mL | INTRAVENOUS | Status: DC | PRN

## 2016-05-01 MED ORDER — ASPIRIN 325 MG PO TABS
325.0000 mg | ORAL_TABLET | Freq: Every day | ORAL | Status: DC
  Administered 2016-05-02: 325 mg via ORAL
  Filled 2016-05-01 (×2): qty 1

## 2016-05-01 MED ORDER — ATORVASTATIN CALCIUM 40 MG PO TABS
40.0000 mg | ORAL_TABLET | Freq: Every day | ORAL | Status: DC
  Administered 2016-05-01 – 2016-05-02 (×2): 40 mg via ORAL
  Filled 2016-05-01 (×2): qty 1

## 2016-05-01 MED ORDER — TRAZODONE HCL 100 MG PO TABS
100.0000 mg | ORAL_TABLET | Freq: Every day | ORAL | Status: DC
  Administered 2016-05-01: 100 mg via ORAL
  Filled 2016-05-01: qty 1

## 2016-05-01 MED ORDER — ACETAMINOPHEN 325 MG PO TABS
650.0000 mg | ORAL_TABLET | ORAL | Status: DC | PRN
  Filled 2016-05-01: qty 2

## 2016-05-01 MED ORDER — PANTOPRAZOLE SODIUM 40 MG PO TBEC
40.0000 mg | DELAYED_RELEASE_TABLET | Freq: Every day | ORAL | Status: DC
  Administered 2016-05-01 – 2016-05-02 (×2): 40 mg via ORAL
  Filled 2016-05-01 (×2): qty 1

## 2016-05-01 MED ORDER — HYDROCHLOROTHIAZIDE 25 MG PO TABS
25.0000 mg | ORAL_TABLET | ORAL | Status: DC
  Administered 2016-05-01: 25 mg via ORAL
  Filled 2016-05-01 (×2): qty 1

## 2016-05-01 MED ORDER — DIPHENOXYLATE-ATROPINE 2.5-0.025 MG PO TABS: 2.0000 | ORAL_TABLET | Freq: Two times a day (BID) | ORAL | Status: DC | PRN

## 2016-05-01 MED ORDER — NITROGLYCERIN 0.4 MG SL SUBL
0.4000 mg | SUBLINGUAL_TABLET | SUBLINGUAL | Status: AC | PRN
  Administered 2016-05-01 (×3): 0.4 mg via SUBLINGUAL
  Filled 2016-05-01 (×3): qty 1

## 2016-05-01 MED ORDER — HEPARIN SODIUM (PORCINE) 5000 UNIT/ML IJ SOLN
5000.0000 [IU] | Freq: Three times a day (TID) | INTRAMUSCULAR | Status: DC
  Administered 2016-05-01 – 2016-05-02 (×3): 5000 [IU] via SUBCUTANEOUS
  Filled 2016-05-01 (×3): qty 1

## 2016-05-01 MED ORDER — SODIUM CHLORIDE 0.9% FLUSH
10.0000 mL | Freq: Two times a day (BID) | INTRAVENOUS | Status: DC
  Administered 2016-05-01 (×2): 10 mL

## 2016-05-01 NOTE — Plan of Care (Signed)
80 year old male with history of nonobstructive CAD per cardiac cath in 2014, SVT, hypertension presents to the ER because of chest pain and fall. While EMS was bringing patient to the hospital patient also had a brief run of SVT which converted to sinus rhythm without any intervention. Patient is admitted for chest pain.  Bill Dunn.

## 2016-05-01 NOTE — ED Provider Notes (Signed)
By signing my name below, I, [name], attest that this documentation has been prepared under the direction and in the presence of [place], DO . Electronically Signed: [name], Scribe. 05/01/2016. 2:28 AM.  TIME SEEN: 2:28 AM  CHIEF COMPLAINT: Chest pain  HPI:  HPI Comments: Bill Dunn is a 80 y.o. male with history of CAD, AAA status post grafting in 2012, hypertension, hyperlipidemia, Crohn's disease, paroxysmal history of SVT who presents to the Emergency Department complaining of intermittent  moderate substernal chest pain that started about 4 hours ago while at rest. He states he woke up from sleep with pressure then pain radiating right upper extremity. Described as a squeezing, pressure-like pain.  Did have associated shortness of breath, nausea, diaphoresis but no dizziness. States it felt like a "elephant on my chest". Pain resolved after he received aspirin and nitroglycerin with EMS. EMS did note a brief episode of a heart rate in the 200s and rhythm strip appears to show SVT. He denies a history of this although it is listed in his chart. He was not given any medication for SVT. Reports last stress test was many years ago. Does not have a history of cardiac stents but does have documented history of nonobstructive CAD on cath in 2012 in [place].   Patient also complaining of a fall with right-sided shoulder, hip and knee pain and right rib pain that happened yesterday. Reports he was trying to get up from his chair lift and use his walker. States he fell on his right side. States he lost his balance and that is what caused him to fall. He did not strike his head. He denies neck or back pain. No numbness, tingling or focal weakness that is new for him.    ROS: See HPI Constitutional: no fever  Eyes: no drainage  ENT: no runny nose   Cardiovascular:   chest pain  Resp: SOB  GI: no vomiting GU: no dysuria Integumentary: no rash  Allergy: no hives   Musculoskeletal: no leg swelling  Neurological: no slurred speech ROS otherwise negative  PAST MEDICAL HISTORY/PAST SURGICAL HISTORY:  Past Medical History:  Diagnosis Date  . AAA (abdominal aortic aneurysm) ([place])    a. s/p stent grafting in 2012, in [place], [place], per pt.  . Arthritis   . Bladder cancer ([place])   . Crohn disease ([place])    a. s/p multiple bowel resections;  b. s/p portacath.  Marland Kitchen HTN (hypertension)   . Hypertriglyceridemia   . Pericarditis 04/13/2013  . Pleuritic chest pain    a. s/p reportedly nl cath in 2012, [place], [place];  b. 04/2013 Cath: LM nl, LAD 93m, D1 nl, LCX nl, OM1 20, OM2 sm 70m, RCA 40p/m, 20d, EF 55-65%;  c. 04/2013 Echo: EF 55%, no rwma;  d. chest pain improved with colchicine.  . Sepsis ([place]) ? 2002  . SVT (supraventricular tachycardia) ([place])    a. 04/2013->bb therapy.  Marland Kitchen TIA (transient ischemic attack)    a. x 2, last ~ 2002    MEDICATIONS:  Prior to Admission medications   Medication Sig Start Date End Date Taking? Authorizing Provider  amLODipine (NORVASC) 10 MG tablet Take 1 tablet (10 mg total) by mouth daily. 04/28/14   Isaiah Serge, NP  aspirin EC 81 MG EC tablet Take 1 tablet (81 mg total) by mouth daily. 04/28/14   Isaiah Serge, NP  colestipol (COLESTID) 1 G tablet Take 1 g by mouth 2 (two) times  daily.    Historical Provider, MD  diphenoxylate-atropine (LOMOTIL) 2.5-0.025 MG per tablet Take 2 tablets by mouth 2 (two) times daily as needed for diarrhea or loose stools.    Historical Provider, MD  Fluticasone Furoate-Vilanterol (BREO ELLIPTA) 100-25 MCG/INH AEPB Inhale 1 puff into the lungs at bedtime. 12/05/13   Historical Provider, MD  hydrochlorothiazide (HYDRODIURIL) 25 MG tablet Take 1 tablet (25 mg total) by mouth daily. Patient taking differently: Take 25 mg by mouth every other day.  04/28/13   Burtis Junes, NP  HYDROcodone-acetaminophen (NORCO/VICODIN) 5-325 MG per tablet Take 1 tablet by mouth every 6 (six) hours as  needed for moderate pain.  04/10/14   Historical Provider, MD  Lactobacillus Rhamnosus, GG, (CULTURELLE PO) Take 1 tablet by mouth 2 (two) times daily.    Historical Provider, MD  metoprolol (TOPROL-XL) 200 MG 24 hr tablet Take 200 mg by mouth at bedtime.    Historical Provider, MD  nitroGLYCERIN (NITROSTAT) 0.4 MG SL tablet Place 1 tablet (0.4 mg total) under the tongue every 5 (five) minutes x 3 doses as needed for chest pain. 04/14/13   Rogelia Mire, NP  ondansetron (ZOFRAN) 4 MG tablet Take 4 mg by mouth daily as needed for nausea or vomiting.     Historical Provider, MD  traZODone (DESYREL) 100 MG tablet Take 100-200 mg by mouth at bedtime as needed for sleep.    Historical Provider, MD    ALLERGIES:  Allergies  Allergen Reactions  . Amoxil [Amoxicillin] Other (See Comments)    REACTION: C-diff  . Augmentin [Amoxicillin-Pot Clavulanate] Other (See Comments)    REACTION: C-Diff  . Ciprofloxacin Diarrhea  . Demerol [Meperidine] Nausea And Vomiting  . Infliximab Nausea Only  . Morphine And Related Nausea And Vomiting  . Ace Inhibitors Cough    SOCIAL HISTORY:  Social History  Substance Use Topics  . Smoking status: Former Smoker    Quit date: 05/05/1991  . Smokeless tobacco: Former Systems developer  . Alcohol use No    FAMILY HISTORY: Family History  Problem Relation Age of Onset  . CAD Father     died @ 23  . CAD Mother     died @ 89    EXAM: BP 114/62   Pulse 72   Temp 99.3 F (37.4 C) (Oral)   Resp 16   SpO2 96%  CONSTITUTIONAL: Alert and oriented and responds appropriately to questions. Chronically ill-appearing, obese, appears uncomfortable, GCS 15 HEAD: Normocephalic; atraumatic EYES: Conjunctivae clear, PERRL, EOMI ENT: normal nose; no rhinorrhea; moist mucous membranes; pharynx without lesions noted; no dental injury; no septal hematoma NECK: Supple, no meningismus, no LAD; no midline spinal tenderness, step-off or deformity; trachea midline CARD: RRR; S1 and S2  appreciated; no murmurs, no clicks, no rubs, no gallops RESP: Normal chest excursion without splinting or tachypnea; breath sounds clear and equal bilaterally; no wheezes, no rhonchi, no rales; no hypoxia or respiratory distress CHEST:  chest wall stable, no crepitus or ecchymosis or deformity, tender over the lateral right chest wall; no flail chest ABD/GI: Normal bowel sounds; non-distended; soft, non-tender, no rebound, no guarding; no ecchymosis or other lesions noted PELVIS:  stable, tender over the right lateral hip, no leg length discrepancy BACK:  The back appears normal and is non-tender to palpation, there is no CVA tenderness; no midline spinal tenderness, step-off or deformity EXT: Tender over the right anterior shoulder without loss of fullness or deformity. Tender over the right knee diffusely without joint effusion. Normal  ROM in all joints; otherwise extremities are non-tender to palpation; no edema; normal capillary refill; no cyanosis, no bony deformity of patient's extremities, no joint effusion, compartments are soft, extremities are warm and well-perfused, no ecchymosis or lacerations    SKIN: Normal color for age and race; warm NEURO: Moves all extremities equally, sensation to light touch intact diffusely, cranial nerves II through XII intact PSYCH: The patient's mood and manner are appropriate. Grooming and personal hygiene are appropriate.  MEDICAL DECISION MAKING: Patient here with what appears to be 2 separate complaints. Complaining of chest pressure with shortness of breath, nausea and diaphoresis. Does have a history of nonobstructive CAD. Was in SVT but this resolved spontaneously and he still had some chest pressure. This resolved after nitroglycerin and aspirin. EKG here shows no new ischemic abnormality. He has some ST elevation in his inferior leads that appears old. Will obtain cardiac labs, chest x-ray.   Patient also reports mechanical fall yesterday. Did not  strike his head. No neck or back pain. No focal neurologic deficits. We'll obtain x-rays of the right shoulder, right ribs, right knee and right hip. Neurovascularly intact distally. We'll give fentanyl for pain.  ED PROGRESS: Patient's labs are unremarkable. Troponin is negative. X-ray show no acute abnormality. His chest pressure is returning. Still in a sinus rhythm. Will give more nitroglycerin as this helped before. Will discuss with medicine for admission.   6:15 AM  Discussed patient's case with hospitatlist, Dr. Hal Hope.  Recommend admission to tele, obs bed.  I will place holding orders per their request. Patient and family (if present) updated with plan. Care transferred to hospitalist service.  I reviewed all nursing notes, vitals, pertinent old records, EKGs, labs, imaging (as available).     EKG Interpretation  Date/Time:  Friday May 01 2016 02:05:52 EST Ventricular Rate:  73 PR Interval:    QRS Duration: 86 QT Interval:  373 QTC Calculation: 411 R Axis:   63 Text Interpretation:  Sinus rhythm Minimal ST elevation, inferior leads No significant change since last tracing Confirmed by WARD,  DO, KRISTEN [phone]) on 05/01/2016 2:09:00 AM         EKG Interpretation  Date/Time:  Friday May 01 2016 02:43:27 EST Ventricular Rate:  67 PR Interval:    QRS Duration: 89 QT Interval:  384 QTC Calculation: 406 R Axis:   60 Text Interpretation:  Sinus rhythm Minimal ST elevation, inferior leads No significant change since last tracing Confirmed by WARD,  DO, KRISTEN [id number]) on 05/01/2016 3:04:04 AM        I personally performed the services described in this documentation, which was scribed in my presence. The recorded information has been reviewed and is accurate.     [place], DO 05/01/16 [phone]

## 2016-05-01 NOTE — ED Notes (Signed)
Patient reports he had recent fall while using walker to ambulate in home. Reports having extreme difficulty ambulating since the fall.

## 2016-05-01 NOTE — H&P (Signed)
.  History and Physical    [name] [id number] DOB: July 12, 1935 DOA: 05/01/2016   PCP: [name], MD   Patient coming from:  Home   Chief Complaint: Chest pain   HPI: Bill Dunn is a 80 y.o. male with medical history significant for HTN, HLD, CKD, COPD, CAD, bladder Ca on treatment, Chron's disease,  presenting with moderate substernal chest pain since 2 am, described as "elephant sitting on the chest", waking him up from sleep, with radiation to the right arm  Pain notworsened with deep inspiration, movement or exertion.   Patient took ASA 324 mg on arrival, and then given 3 NTG with  relief.  Denies any dizziness  No syncope or presyncope.  Reports some acute on chronic dyspnea, worse on exertion. Denies any cough. Denies any fever or chills but reports sick contacts at home. Denies any nausea, vomiting or abdominal pain. Appetite is normal and eats salt rich foods. Reports chronic leg swelling without  calf pain. Denies any headaches or vision changes. Denies any seizures No confusion reported. Last Cardiac cath was on 2014. No recent long distance trips. Denies any new stressors. No new meds. Not on hormonal therapy.  No Viagra. No new herbal supplements. He is compliant to his meds  No tobacco,  ETOH or recreational drugs. Of note, he had a mechanical fall yesterday without hitting his head, with negative workup and imaging at the ER   ED Course:  BP 113/59   Pulse 61   Temp 99.3 F (37.4 C) (Oral)   Resp 12   SpO2 93%    creatinine 1.7 glucose 144 troponin 0.02  white count 9.4 hemoglobin 13.6 platelets 92,000 EKG with minimal ST elevation in the inferior leads  Review of Systems: As per HPI otherwise 10 point review of systems negative.   Past Medical History:  Diagnosis Date  . AAA (abdominal aortic aneurysm) ([place])    a. s/p stent grafting in 2012, in [place], [place], per pt.  . Arthritis   . Bladder cancer ([place])   . Crohn disease ([place])    a. s/p  multiple bowel resections;  b. s/p portacath.  Marland Kitchen HTN (hypertension)   . Hypertriglyceridemia   . Pericarditis 04/13/2013  . Pleuritic chest pain    a. s/p reportedly nl cath in 2012, [place], [place];  b. 04/2013 Cath: LM nl, LAD 97m, D1 nl, LCX nl, OM1 20, OM2 sm 44m, RCA 40p/m, 20d, EF 55-65%;  c. 04/2013 Echo: EF 55%, no rwma;  d. chest pain improved with colchicine.  . Sepsis ([place]) ? 2002  . SVT (supraventricular tachycardia) ([place])    a. 04/2013->bb therapy.  Marland Kitchen TIA (transient ischemic attack)    a. x 2, last ~ 2002    Past Surgical History:  Procedure Laterality Date  . ABDOMINAL AORTIC ANEURYSM REPAIR     a. reported stenting 2012  . APPENDECTOMY    . CHOLECYSTECTOMY    . COCHLEAR IMPLANT    . COLON SURGERY     a. multiple bowel resections 2/2 chrohn's dzs.  . INGUINAL HERNIA REPAIR    . LEFT HEART CATHETERIZATION WITH CORONARY ANGIOGRAM Bilateral 04/13/2013   Procedure: LEFT HEART CATHETERIZATION WITH CORONARY ANGIOGRAM;  Surgeon: Peter M Martinique, MD;  Location: [facility] CATH LAB;  Service: Cardiovascular;  Laterality: Bilateral;  . Left Shoulder Surgery    . RENAL CYST EXCISION    . spinal fusion      Social History Social History   Social History  .  Marital status: Married    Spouse name: N/A  . Number of children: N/A  . Years of education: N/A   Occupational History  . Not on file.   Social History Main Topics  . Smoking status: Former Smoker    Quit date: 05/05/1991  . Smokeless tobacco: Former Systems developer  . Alcohol use No  . Drug use: No  . Sexual activity: Not Currently   Other Topics Concern  . Not on file   Social History Narrative   Lives in [place].  Does not routinely exercise.     Allergies  Allergen Reactions  . Amoxil [Amoxicillin] Other (See Comments)    REACTION: C-diff  . Augmentin [Amoxicillin-Pot Clavulanate] Other (See Comments)    REACTION: C-Diff  . Ciprofloxacin Diarrhea  . Demerol [Meperidine] Nausea And Vomiting  . Infliximab  Nausea Only  . Morphine And Related Nausea And Vomiting  . Ace Inhibitors Cough    Family History  Problem Relation Age of Onset  . CAD Father     died @ 59  . CAD Mother     died @ 14      Prior to Admission medications   Medication Sig Start Date End Date Taking? Authorizing Provider  amLODipine (NORVASC) 10 MG tablet Take 1 tablet (10 mg total) by mouth daily. 04/28/14  Yes Isaiah Serge, NP  atorvastatin (LIPITOR) 40 MG tablet Take 40 mg by mouth daily.   Yes Historical Provider, MD  cetirizine (ZYRTEC) 10 MG tablet Take 10 mg by mouth daily.   Yes Historical Provider, MD  colestipol (COLESTID) 1 G tablet Take 1 g by mouth 2 (two) times daily.   Yes Historical Provider, MD  diphenoxylate-atropine (LOMOTIL) 2.5-0.025 MG per tablet Take 2 tablets by mouth 2 (two) times daily as needed for diarrhea or loose stools.   Yes Historical Provider, MD  fluticasone (FLONASE) 50 MCG/ACT nasal spray Place 1 spray into both nostrils daily.   Yes Historical Provider, MD  hydrochlorothiazide (HYDRODIURIL) 25 MG tablet Take 1 tablet (25 mg total) by mouth daily. Patient taking differently: Take 25 mg by mouth every other day.  04/28/13  Yes Burtis Junes, NP  HYDROcodone-acetaminophen (NORCO/VICODIN) 5-325 MG per tablet Take 1 tablet by mouth every 6 (six) hours as needed for moderate pain.  04/10/14  Yes Historical Provider, MD  metoprolol (TOPROL-XL) 200 MG 24 hr tablet Take 200 mg by mouth at bedtime.   Yes Historical Provider, MD  nitroGLYCERIN (NITROSTAT) 0.4 MG SL tablet Place 1 tablet (0.4 mg total) under the tongue every 5 (five) minutes x 3 doses as needed for chest pain. 04/14/13  Yes Rogelia Mire, NP  omeprazole (PRILOSEC) 40 MG capsule Take 40 mg by mouth daily.   Yes Historical Provider, MD  traZODone (DESYREL) 100 MG tablet Take 100 mg by mouth at bedtime.    Yes Historical Provider, MD  aspirin EC 81 MG EC tablet Take 1 tablet (81 mg total) by mouth daily. Patient not taking:  Reported on 05/01/2016 04/28/14   Isaiah Serge, NP    Physical Exam:    Vitals:   05/01/16 0639 05/01/16 0640 05/01/16 0641 05/01/16 0700  BP: 107/62   113/59  Pulse: (!) 57 (!) 56 (!) 59 61  Resp: 12 14 21 12   Temp:      TempSrc:      SpO2: 92% 93% 95% 93%       Constitutional: NAD, calm, comfortable  Vitals:   05/01/16 [id number]  05/01/16 0640 05/01/16 0641 05/01/16 0700  BP: 107/62   113/59  Pulse: (!) 57 (!) 56 (!) 59 61  Resp: 12 14 21 12   Temp:      TempSrc:      SpO2: 92% 93% 95% 93%   Eyes: PERRL, lids and conjunctivae normal ENMT: Mucous membranes are moist. Posterior pharynx clear of any exudate or lesions. Normal dentition. Flushed fascies Neck: normal, supple, no masses, no thyromegaly Respiratory:  sligntly decreased LUL  Sounds, no wheezing or rales. t Cardiovascular: Regular rate and rhythm, no murmurs / rubs / gallops. 1+  extremity edema. 2+ pedal pulses. No carotid bruits. Lport  Abdomen:  obesen no tenderness, no masses palpated. No hepatosplenomegaly. Bowel sounds positive.  Musculoskeletal: no clubbing / cyanosis. No joint deformity upper and lower extremities. Good ROM, no contractures. Normal muscle tone.  Skin: no rashes, lesions, ulcers.  Neurologic: CN 2-12 grossly intact. Sensation intact, DTR normal. Strength 5/5 in all 4.  Psychiatric: Normal judgment and insight. Alert and oriented x 3. Normal mood.     Labs on Admission: I have personally reviewed following labs and imaging studies  CBC:  Recent Labs Lab 05/01/16 0430 05/01/16 0519  WBC 9.4  --   NEUTROABS 7.4  --   HGB 12.9* 13.6  HCT 41.4 40.0  MCV 87.0  --   PLT 92*  --     Basic Metabolic Panel:  Recent Labs Lab 05/01/16 0430 05/01/16 0519  NA 137 138  K 4.7 4.7  CL 100* 101  CO2 27  --   GLUCOSE 150* 144*  BUN 24* 27*  CREATININE 1.77* 1.70*  CALCIUM 8.2*  --     GFR: CrCl cannot be calculated (Unknown ideal weight.).  Liver Function Tests: No results for  input(s): AST, ALT, ALKPHOS, BILITOT, PROT, ALBUMIN in the last 168 hours. No results for input(s): LIPASE, AMYLASE in the last 168 hours. No results for input(s): AMMONIA in the last 168 hours.  Coagulation Profile: No results for input(s): INR, PROTIME in the last 168 hours.  Cardiac Enzymes: No results for input(s): CKTOTAL, CKMB, CKMBINDEX, TROPONINI in the last 168 hours.  BNP (last 3 results) No results for input(s): PROBNP in the last 8760 hours.  HbA1C: No results for input(s): HGBA1C in the last 72 hours.  CBG: No results for input(s): GLUCAP in the last 168 hours.  Lipid Profile: No results for input(s): CHOL, HDL, LDLCALC, TRIG, CHOLHDL, LDLDIRECT in the last 72 hours.  Thyroid Function Tests: No results for input(s): TSH, T4TOTAL, FREET4, T3FREE, THYROIDAB in the last 72 hours.  Anemia Panel: No results for input(s): VITAMINB12, FOLATE, FERRITIN, TIBC, IRON, RETICCTPCT in the last 72 hours.  Urine analysis: No results found for: COLORURINE, APPEARANCEUR, LABSPEC, PHURINE, GLUCOSEU, HGBUR, BILIRUBINUR, KETONESUR, PROTEINUR, UROBILINOGEN, NITRITE, LEUKOCYTESUR  Sepsis Labs: @LABRCNTIP (procalcitonin:4,lacticidven:4) )No results found for this or any previous visit (from the past 240 hour(s)).   Radiological Exams on Admission: Dg Ribs Unilateral W/chest Right  Result Date: 05/01/2016 CLINICAL DATA:  Right-sided chest pain after fall yesterday. Now with left-sided chest pain and shortness of breath. EXAM: RIGHT RIBS AND CHEST - 3+ VIEW COMPARISON:  Chest CT 04/28/2014 FINDINGS: No fracture or other bone lesions are seen involving the ribs. There is no evidence of pneumothorax or pleural effusion. Both lungs are clear. Left chest port tip in the SVC. Prominent cardiomediastinal contours likely secondary to lipomatosis as seen on prior chest CT. IMPRESSION: No evidence of acute rib fracture.  No acute  intrathoracic process. Electronically Signed   By: Jeb Levering  M.D.   On: 05/01/2016 03:24   Dg Shoulder Right  Result Date: 05/01/2016 CLINICAL DATA:  Right-sided shoulder pain after fall yesterday. EXAM: RIGHT SHOULDER - 2+ VIEW COMPARISON:  None. FINDINGS: There is no evidence of fracture or dislocation. Chronic widening of the acromioclavicular joint compared with chest radiograph 04/27/2014. Mild glenohumeral osteoarthritis. Soft tissues are unremarkable. IMPRESSION: No acute fracture or subluxation of the right shoulder. Electronically Signed   By: Jeb Levering M.D.   On: 05/01/2016 03:26   Dg Knee Complete 4 Views Right  Result Date: 05/01/2016 CLINICAL DATA:  Right knee pain after fall yesterday. EXAM: RIGHT KNEE - COMPLETE 4+ VIEW COMPARISON:  None. FINDINGS: No evidence of fracture or dislocation. Mild tricompartmental osteoarthritis with peripheral spurring. Narrowing of the medial tibiofemoral joint space. Possible small joint effusion. Soft tissues are unremarkable. Tiny metallic density in the medial soft tissues appears chronic. IMPRESSION: 1. No acute fracture or subluxation of the right knee. 2. Mild tricompartmental osteoarthritis. Possible small joint effusion. Electronically Signed   By: Jeb Levering M.D.   On: 05/01/2016 03:30   Dg Hip Unilat W Or Wo Pelvis 2-3 Views Right  Result Date: 05/01/2016 CLINICAL DATA:  Right hip pain after fall yesterday. EXAM: DG HIP (WITH OR WITHOUT PELVIS) 2-3V RIGHT COMPARISON:  None. FINDINGS: Three partially cannulated screws traverse the left femoral neck. No periprosthetic lucency. No evidence of acute fracture. Pubic rami appear intact. Pubic symphysis is congruent. Left hip arthroplasty appears intact where visualized. Aorto bi-iliac stent graft. Vascular coils in the region of the left external iliac. Right iliac stent. IMPRESSION: No acute fracture or subluxation of the right hip. Post surgical pinning of the right proximal femur with intact hardware. Electronically Signed   By: Jeb Levering  M.D.   On: 05/01/2016 03:28    EKG: Independently reviewed.  Assessment/Plan Active Problems:   Acute chest pain   NSVT (nonsustained ventricular tachycardia) (HCC)   Crohn's disease (HCC)   HTN (hypertension)   Hypertriglyceridemia   History of AAA (abdominal aortic aneurysm) repair   Pericarditis   History of TIA (transient ischemic attack)   Chest pain, negative MI     Chest pain syndrome/known CAD s/p card cath 2014  HEART score 7  Troponin neg to date  , EKG with minimal elevation inferior leads . CP relieved by nitroglycerin but continued to take in the ED as symptoms not resolved , Received ASA in transit  CXR without acute process. Last 2 D echo on 2014   Normal LVF EF 55 . Risk factors include HTN, HLD, obesity, sedentary lifestyle  Admit to Telemetry/ Observation Chest pain order set Cycle troponins EKG in am continue ASA, O2 and NTG as needed Continue preadmission beta blocker and nitrate Statins  GI cocktail Check Lipid panel  Hb A1C Consult to Cards, patient will be kept NPO with sips for meds in case that cardiac catheterization is indicated.    Hypertension BP 113/59   Pulse 61   Temp 99.3 F (37.4 C) (Oral)   Resp 12   SpO2 93%  Continue home anti-hypertensive medications    Hyperlipidemia Continue home statins  Chronic kidney disease stage 3  Current Cr 1.7 at baseline  Lab Results  Component Value Date   CREATININE 1.70 (H) 05/01/2016   CREATININE 1.77 (H) 05/01/2016   CREATININE 1.7 (H) 05/07/2014  Continue to monitor  Repeat CMET in am  Thrombocytopenia Likely due  to bladder Ca therapy (12/19)  Currently at 92k No transfusion is indicated at this time Transfuse 1 unit of platelets if count is less or equal than 10,000 or 20,000 if the patient is acutely bleeding Hold Heparin  if  platelets drop to less than 50,000 Repeat CBC in am    GERD, no acute symptoms: Continue PPI   Crohn's disease without  Flare. S/p multiple abdominal  surgeries while in [place]  No intervention is indicated at this time   History of Bladder Ca dx 2009 Known pulmonary nodules per CT followed at [place] treatment by Urologist  on 12/19   Insomnia Continue Desyrel   DVT prophylaxis:  Heparin sq with close monitoring of platelets (currently 92k)  Code Status:   Full   Family Communication:  Discussed with patient Disposition Plan: Expect patient to be discharged to home after condition improves Consults called:    None Admission status:Tele  Obs  Lenae Wherley E, PA-C [facility]   05/01/2016, 7:28 AM

## 2016-05-01 NOTE — ED Notes (Signed)
Patient transported to X-ray 

## 2016-05-01 NOTE — ED Notes (Signed)
Awaiting meds from pharmacy  

## 2016-05-01 NOTE — ED Triage Notes (Addendum)
Patient arrived to ED via [facility]. EMS reports: Patient awakened from sleep at approx 0030 with substernal chest pain which radiated to R shoulder and down to R elbow. Clammy. Pain intermittent. Patient took ASA 81 mg x 2 & NTG x 3 with no relief prior to EMS arrival. EMS administered ASA 81 mg x 2. Chest pain 5/10. VSS. BP 113/62, Pulse 72, Pulse ox 91-92% on room air. 96% on 2 LPM via nasal cannula.  Patient is nonambulatory d/t hip injury - per EMS.  EMS also reports that patient's heartrate was 200's - dropped to 40 - then changed to 70's. EKG strips provided.

## 2016-05-01 NOTE — ED Notes (Addendum)
Patient has power port in L chest. Not accessed. Reports he has had power ports x 20 years.

## 2016-05-01 NOTE — Consult Note (Signed)
Cardiology Consultation    Patient ID: Shrihan Dicola MRN: [id number], DOB/AGE: June 20, 1935   Admit date: 05/01/2016   Primary Physician: [name], MD Primary Cardiologist: P. Martinique, MD   Patient Profile    80 y/o ? with a h/o AAA s/p stent grafting, c/p w/ nonobstructive CAD on cath in 04/2013, HTN, HTG, chronic bronchitis, and bladder cancer, who presented to the ED on 12/29 with chest pain and report of SVT.  Past Medical History    Past Medical History:  Diagnosis Date  . AAA (abdominal aortic aneurysm) ([place])    a. s/p stent grafting in 2012, in [place], [place], per pt.  . Arthritis   . Bladder cancer ([place])   . Chronic bronchitis ([place])   . CKD (chronic kidney disease), stage III   . Crohn disease ([place])    a. s/p multiple bowel resections;  b. s/p portacath.  . Disability of walking    a. no feeling in right leg since surgical complication ~ [id number] - predominantly uses w/c.  Marland Kitchen Dysphagia    a. 03/2016 EGD: nl EGD. Esoph dil performed.  Marland Kitchen HTN (hypertension)   . Hypertriglyceridemia   . Lung nodule    a. RML -->stable dating back to 02/2014.  . Morbid obesity ([place])   . Pericarditis 04/13/2013  . Pleuritic chest pain    a. s/p reportedly nl cath in 2012, [place], [place];  b. 04/2013 Cath: LM nl, LAD 14m, D1 nl, LCX nl, OM1 20, OM2 sm 31m, RCA 40p/m, 20d, EF 55-65%;  c. 04/2013 Echo: EF 55%, no rwma;  d. chest pain improved with colchicine.  . Sepsis ([place]) ? 2002  . SVT (supraventricular tachycardia) ([place])    a. 04/2013->bb therapy.  Marland Kitchen TIA (transient ischemic attack)    a. x 2, last ~ 2002    Past Surgical History:  Procedure Laterality Date  . ABDOMINAL AORTIC ANEURYSM REPAIR     a. reported stenting 2012  . APPENDECTOMY    . CHOLECYSTECTOMY    . COCHLEAR IMPLANT    . COLON SURGERY     a. multiple bowel resections 2/2 chrohn's dzs.  . INGUINAL HERNIA REPAIR    . LEFT HEART CATHETERIZATION WITH CORONARY ANGIOGRAM Bilateral 04/13/2013   Procedure:  LEFT HEART CATHETERIZATION WITH CORONARY ANGIOGRAM;  Surgeon: Peter M Martinique, MD;  Location: [facility] CATH LAB;  Service: Cardiovascular;  Laterality: Bilateral;  . Left Shoulder Surgery    . RENAL CYST EXCISION    . spinal fusion       Allergies  Allergies  Allergen Reactions  . Amoxil [Amoxicillin] Other (See Comments)    REACTION: C-diff  . Augmentin [Amoxicillin-Pot Clavulanate] Other (See Comments)    REACTION: C-Diff  . Ciprofloxacin Diarrhea  . Demerol [Meperidine] Nausea And Vomiting  . Infliximab Nausea Only  . Morphine And Related Nausea And Vomiting  . Ace Inhibitors Cough    History of Present Illness    80 y/o ? with the above complex PMH including pleuritic c/p and non-obstructive CAD, HTN, HTG, chronic bronchitis, TIA, and bladder cancer - actively receiving chemo ([facility]).  He is sedentary in the setting of loss of feeling in his right lower extremity that occurred following surgery to remove a cyst from his kidney about 7 years ago. Since August, he has mostly been using a motorized wheelchair to get around. He recently fell while transitioning from his lift chair to his walker 2 nights ago. He had significant right rib, hip, and knee pain  but did not seek evaluation at that time.   He has prior cardiac history with pleuritic chest pain and admission in December 2014. Catheterization at that time showed moderate nonobstructive CAD and normal LV function. He was treated with colchicine with subsequent improvement in chest pain. He has been followed by Dr. Martinique but has not been seen since January 2016. He was previously spending time in [place] during the winters but because of progressive disability, he and his wife decided to sell their home there and he now lives in [place] with his wife, daughter, son-in-law, and grandchildren.  He says he has done well from a cardiac standpoint without any chest pain or dyspnea. In November, he was evaluated secondary to dysphagia and  underwent EGD, which was normal. Esophageal dilatation was empirically performed. He was otherwise in his usual state of health until this morning when he awoke suddenly at approximately 1 AM with left-sided chest heaviness and dyspnea. His daughter applied oxygen and he did note some improvement. EMS was called and he was treated with aspirin and 3 sublingual nitroglycerin tablets. Per ER notes, initial rhythm strip showed SVT with a heart rate of 200. He apparently broke spontaneously converted to sinus rhythm after a brief period of bradycardia. These rhythm strips are not currently available for review. [facility] where pain eventually resolved. He says total duration was likely somewhere around 1 hour. He is currently chest pain-free. ECG is notable for mild inferior ST segment elevation, though this is unchanged from prior ECGs. Troponin is mildly elevated at 0.03.  Home Medications    Prior to Admission medications   Medication Sig Start Date End Date Taking? Authorizing Provider  amLODipine (NORVASC) 10 MG tablet Take 1 tablet (10 mg total) by mouth daily. 04/28/14  Yes Isaiah Serge, NP  atorvastatin (LIPITOR) 40 MG tablet Take 40 mg by mouth daily.   Yes Historical Provider, MD  cetirizine (ZYRTEC) 10 MG tablet Take 10 mg by mouth daily.   Yes Historical Provider, MD  colestipol (COLESTID) 1 G tablet Take 1 g by mouth 2 (two) times daily.   Yes Historical Provider, MD  diphenoxylate-atropine (LOMOTIL) 2.5-0.025 MG per tablet Take 2 tablets by mouth 2 (two) times daily as needed for diarrhea or loose stools.   Yes Historical Provider, MD  fluticasone (FLONASE) 50 MCG/ACT nasal spray Place 1 spray into both nostrils daily.   Yes Historical Provider, MD  hydrochlorothiazide (HYDRODIURIL) 25 MG tablet Take 1 tablet (25 mg total) by mouth daily. Patient taking differently: Take 25 mg by mouth every other day.  04/28/13  Yes Burtis Junes, NP    HYDROcodone-acetaminophen (NORCO/VICODIN) 5-325 MG per tablet Take 1 tablet by mouth every 6 (six) hours as needed for moderate pain.  04/10/14  Yes Historical Provider, MD  metoprolol (TOPROL-XL) 200 MG 24 hr tablet Take 200 mg by mouth at bedtime.   Yes Historical Provider, MD  nitroGLYCERIN (NITROSTAT) 0.4 MG SL tablet Place 1 tablet (0.4 mg total) under the tongue every 5 (five) minutes x 3 doses as needed for chest pain. 04/14/13  Yes Rogelia Mire, NP  omeprazole (PRILOSEC) 40 MG capsule Take 40 mg by mouth daily.   Yes Historical Provider, MD  traZODone (DESYREL) 100 MG tablet Take 100 mg by mouth at bedtime.    Yes Historical Provider, MD  aspirin EC 81 MG EC tablet Take 1 tablet (81 mg total) by mouth daily. Patient not taking:  Reported on 05/01/2016 04/28/14   Isaiah Serge, NP    Family History    Family History  Problem Relation Age of Onset  . CAD Father     died @ 3  . CAD Mother     died @ 68    Social History    Social History   Social History  . Marital status: Married    Spouse name: N/A  . Number of children: N/A  . Years of education: N/A   Occupational History  . Not on file.   Social History Main Topics  . Smoking status: Former Smoker    Quit date: 05/05/1991  . Smokeless tobacco: Former Systems developer  . Alcohol use No  . Drug use: No  . Sexual activity: Not Currently   Other Topics Concern  . Not on file   Social History Narrative   Lives in [place] with wife, daughter, son-in-law, and 2 grandchildren.  More or less wheelchair bound. Occasionally uses walker for short distances.     Review of Systems    General:  No chills, fever, night sweats or weight changes.  Cardiovascular:  +++ chest pain, no dyspnea on exertion, edema, orthopnea, palpitations, paroxysmal nocturnal dyspnea. Dermatological: No rash, lesions/masses Respiratory: No cough, dyspnea Urologic: No hematuria, dysuria Abdominal:   No nausea, vomiting, diarrhea, bright red  blood per rectum, melena, or hematemesis Neurologic:  He has no feeling in his right lower leg which makes ambulation difficult. No visual changes, wkns, changes in mental status. All other systems reviewed and are otherwise negative except as noted above.  Physical Exam    Blood pressure 123/69, pulse (!) 55, temperature 99.3 F (37.4 C), temperature source Oral, resp. rate 12, SpO2 93 %.  General: Pleasant, NAD Psych: Normal affect. Neuro: Alert and oriented X 3. Moves all extremities spontaneously. HEENT: NormalwWith the exception of being hard of hearing -hearing aid in place in left ear.  Neck: Supple, obese, difficult to gauge JVP. No bruits. Lungs:  Resp regular and unlabored, CTA. Heart: RRR no s3, s4, or murmurs. Abdomen: Soft, obese, non-tender, non-distended, BS + x 4.  Extremities: No clubbing, cyanosis or edema. DP/PT/Radials 2+ and equal bilaterally.  Labs    Troponin [facility] Test)  Recent Labs  05/01/16 0500  TROPIPOC 0.02   Lab Results  Component Value Date   WBC 9.4 05/01/2016   HGB 13.6 05/01/2016   HCT 40.0 05/01/2016   MCV 87.0 05/01/2016   PLT 92 (L) 05/01/2016     Recent Labs Lab 05/01/16 0430 05/01/16 0519  NA 137 138  K 4.7 4.7  CL 100* 101  CO2 27  --   BUN 24* 27*  CREATININE 1.77* 1.70*  CALCIUM 8.2*  --   GLUCOSE 150* 144*   Lab Results  Component Value Date   CHOL 119 05/01/2016   HDL 58 05/01/2016   LDLCALC 35 05/01/2016   TRIG 128 05/01/2016    Radiology Studies    Dg Ribs Unilateral W/chest Right  Result Date: 05/01/2016 CLINICAL DATA:  Right-sided chest pain after fall yesterday. Now with left-sided chest pain and shortness of breath. EXAM: RIGHT RIBS AND CHEST - 3+ VIEW COMPARISON:  Chest CT 04/28/2014 FINDINGS: No fracture or other bone lesions are seen involving the ribs. There is no evidence of pneumothorax or pleural effusion. Both lungs are clear. Left chest port tip in the SVC. Prominent cardiomediastinal  contours likely secondary to lipomatosis as seen on prior chest CT. IMPRESSION:  No evidence of acute rib fracture.  No acute intrathoracic process. Electronically Signed   By: Jeb Levering M.D.   On: 05/01/2016 03:24   Dg Shoulder Right  Result Date: 05/01/2016 CLINICAL DATA:  Right-sided shoulder pain after fall yesterday. EXAM: RIGHT SHOULDER - 2+ VIEW COMPARISON:  None. FINDINGS: There is no evidence of fracture or dislocation. Chronic widening of the acromioclavicular joint compared with chest radiograph 04/27/2014. Mild glenohumeral osteoarthritis. Soft tissues are unremarkable. IMPRESSION: No acute fracture or subluxation of the right shoulder. Electronically Signed   By: Jeb Levering M.D.   On: 05/01/2016 03:26   Dg Knee Complete 4 Views Right  Result Date: 05/01/2016 CLINICAL DATA:  Right knee pain after fall yesterday. EXAM: RIGHT KNEE - COMPLETE 4+ VIEW COMPARISON:  None. FINDINGS: No evidence of fracture or dislocation. Mild tricompartmental osteoarthritis with peripheral spurring. Narrowing of the medial tibiofemoral joint space. Possible small joint effusion. Soft tissues are unremarkable. Tiny metallic density in the medial soft tissues appears chronic. IMPRESSION: 1. No acute fracture or subluxation of the right knee. 2. Mild tricompartmental osteoarthritis. Possible small joint effusion. Electronically Signed   By: Jeb Levering M.D.   On: 05/01/2016 03:30   Dg Hip Unilat W Or Wo Pelvis 2-3 Views Right  Result Date: 05/01/2016 CLINICAL DATA:  Right hip pain after fall yesterday. EXAM: DG HIP (WITH OR WITHOUT PELVIS) 2-3V RIGHT COMPARISON:  None. FINDINGS: Three partially cannulated screws traverse the left femoral neck. No periprosthetic lucency. No evidence of acute fracture. Pubic rami appear intact. Pubic symphysis is congruent. Left hip arthroplasty appears intact where visualized. Aorto bi-iliac stent graft. Vascular coils in the region of the left external iliac. Right  iliac stent. IMPRESSION: No acute fracture or subluxation of the right hip. Post surgical pinning of the right proximal femur with intact hardware. Electronically Signed   By: Jeb Levering M.D.   On: 05/01/2016 03:28    ECG & Cardiac Imaging    RSR, 72, <52mm inferior ST elevation - noted on prior ECG's as well.  Assessment & Plan    1. Supraventricular tachycardia: Patient awoke early this morning with chest discomfort. Per ER notes, he was found to be in SVT with a rate of 200. This broke spontaneously. Streps are not currently available for review. He does have a prior history of SVT and also nonsustained VT (seen during hospitalization in December 2015). He is on beta blocker therapy at home. Agree with observation and rule out. Continue beta blocker. Follow-up echocardiogram and TSH.  2. Left-sided chest pressure/nonobstructive coronary artery disease/elevated troponin: As above, patient awoke with chest pressure this morning. He did not experience palpitations thus it is unclear if SVT preceded chest pressure. Troponin is mildly elevated at 0.03. ECG shows mild inferior ST segment elevation, which is unchanged from prior ECGs. Agree with observation and continued trending troponin. Mild flat elevation may represent demand ischemia in the setting of tachycardia. He does have a known history of nonobstructive CAD and provided that troponin trend remains flat and echo w/o wall motion abnormalities, would plan conservative Rx - could consider outpt MV if recurrent c/p.  With stage III chronic kidney disease and an admission creatinine of 1.7, he is not a good candidate for diagnostic catheterization. Continue aspirin, statin, and beta blocker.  3. Hypertensive heart disease: Blood pressure is stable. Continue beta blocker and amlodipine.  4. Hyperlipidemia/hypertriglyceridemia: Continue statin and colestipol therapy.  5.  CKD III: follow.  6.  Recent fall:  Lost  balance while transitioning  from lift chair to walker the other night resulting in right rib, hip, and knee pain. X-rays in ED this morning were negative for acute fracture.  Signed, Murray Hodgkins, NP 05/01/2016, 11:51 AM  The patient has been seen in conjunction with Vincent Peyer, NP. All aspects of care have been considered and discussed. The patient has been personally interviewed, examined, and all clinical data has been reviewed.   Chest pain syndrome as noted above. Total duration less than 40 minutes. Report of SVT at rates greater than ~200 bpm. Spontaneous resolution occurred followed by resolution of symptoms. Wife states the patient was clammy, short of breath, and obviously experiencing severe discomfort when EMS was called. Unfortunately, the rhythm strips reviewed by Pryor Curia DO are not available for [place] to review. History of SVT and is on high-dose beta blocker therapy. He takes metoprolol each night. The medication had been on board for approximately an hour before the symptoms started.  Prior documentation of nonobstructive coronary disease December 2014.  Plan is to trend cardiac markers, ECG's, and perform echocardiogram. If there is not a significant bump in markers, ECG reveals no evolution, and LV function remains normal on echo, no further ischemic evaluation will be necessary. At that point, the presumed diagnosis will be paroxysmal supraventricular tachycardia with associated chest pain.  We will follow.

## 2016-05-01 NOTE — ED Notes (Signed)
IV nurse will try to get blood from port

## 2016-05-01 NOTE — ED Notes (Signed)
Attempt to access port x 1 unsuccessful. IV team consult ordered.

## 2016-05-02 ENCOUNTER — Observation Stay (HOSPITAL_BASED_OUTPATIENT_CLINIC_OR_DEPARTMENT_OTHER): Payer: Medicare Other

## 2016-05-02 DIAGNOSIS — W19XXXD Unspecified fall, subsequent encounter: Secondary | ICD-10-CM

## 2016-05-02 DIAGNOSIS — E781 Pure hyperglyceridemia: Secondary | ICD-10-CM

## 2016-05-02 DIAGNOSIS — I10 Essential (primary) hypertension: Secondary | ICD-10-CM

## 2016-05-02 DIAGNOSIS — W19XXXA Unspecified fall, initial encounter: Secondary | ICD-10-CM

## 2016-05-02 DIAGNOSIS — I2510 Atherosclerotic heart disease of native coronary artery without angina pectoris: Secondary | ICD-10-CM

## 2016-05-02 DIAGNOSIS — I472 Ventricular tachycardia: Secondary | ICD-10-CM

## 2016-05-02 DIAGNOSIS — Z8673 Personal history of transient ischemic attack (TIA), and cerebral infarction without residual deficits: Secondary | ICD-10-CM | POA: Diagnosis not present

## 2016-05-02 DIAGNOSIS — I2583 Coronary atherosclerosis due to lipid rich plaque: Secondary | ICD-10-CM

## 2016-05-02 DIAGNOSIS — Z9889 Other specified postprocedural states: Secondary | ICD-10-CM | POA: Diagnosis not present

## 2016-05-02 DIAGNOSIS — R079 Chest pain, unspecified: Secondary | ICD-10-CM | POA: Diagnosis not present

## 2016-05-02 DIAGNOSIS — R0789 Other chest pain: Secondary | ICD-10-CM | POA: Diagnosis not present

## 2016-05-02 LAB — ECHOCARDIOGRAM COMPLETE: Weight: 4102.4 oz

## 2016-05-02 MED ORDER — HYDROCODONE-ACETAMINOPHEN 5-325 MG PO TABS: 1.0000 | ORAL_TABLET | ORAL | 0 refills | Status: DC | PRN

## 2016-05-02 MED ORDER — TRAMADOL HCL 50 MG PO TABS
50.0000 mg | ORAL_TABLET | Freq: Four times a day (QID) | ORAL | Status: DC | PRN
  Administered 2016-05-02: 50 mg via ORAL
  Filled 2016-05-02: qty 1

## 2016-05-02 MED ORDER — HEPARIN SOD (PORK) LOCK FLUSH 100 UNIT/ML IV SOLN: 500.0000 [IU] | INTRAVENOUS | Status: DC | PRN

## 2016-05-02 MED ORDER — MONTELUKAST SODIUM 10 MG PO TABS: 10.0000 mg | ORAL_TABLET | Freq: Every day | ORAL | 0 refills | Status: AC

## 2016-05-02 MED ORDER — HYDROCHLOROTHIAZIDE 25 MG PO TABS: 25.0000 mg | ORAL_TABLET | ORAL | Status: DC

## 2016-05-02 NOTE — Progress Notes (Signed)
Physician Discharge Summary  Bill Dunn  [id number]  DOB: 08-06-1935  DOA: 05/01/2016 PCP: [name], MD  Admit date: 05/01/2016 Discharge date: 05/02/2016  Admitted From: Home  Disposition:  Home   Recommendations for Outpatient Follow-up:  1. Follow up with PCP in 1-2 weeks 2. Please obtain BMP/CBC in one week 3. Follow up with Dr Martinique in 2-3 weeks  4. May need Nuclear stress test   Discharge Condition: Stable  CODE STATUS: Full  Diet recommendation: Heart Healthy / Carb Modified  Brief/Interim Summary: 80 year old male with compressed past medical history including CAD, hypertension, chronic bronchitis, TIA, bladder cancer-actively receiving chemotherapy and history of SVT. Was sent to the ED complaining of chest pressure that we him up from sleep. Daughter give him 3 NTG's and aspirating with mild relief. EMS report SVT is a rate greater than 200 bpm, records available. Spontaneously resolved. Patient was admitted for further evaluation. Cardiology was consulted, echo was performed, cardiology didn't the patient is clear for discharge and follow-up as an outpatient.  Subjective: She has seen and examined today with family at bedside. Patient main concern is multiple joint pains from fall. Chest pain has resolved denies shortness of breath and palpitation. Also concern about cough from postnasal drip.  Discharge Diagnoses:  Chest pain 2/2 Supraventricular tachycardia - H/o SVT in the past that broke spontaneously. - chest pain resolved. ECHO no changes from previous, EF 50-60%, G1DD, motion wall abnormalities not completely excluded. Mild elevated troponin likely represent demand ischemia in the setting of tachycardia. Although patient have hx of nonobstructive CAD. Cardiac cath at this point is not recommended - per cardio evaluation Patient will need outpatient nuclear stress test Continue Bb, statin and ASA  HTN - BP stable  Continue Amlodipine, HCTZ and  Toprol XL   CKD III - Cr at baseline  Follow up with PCP   Mechanical fall - multiple xray negative  Tylenol PRN pain  Will give percocet for 3 days - follow up with PCP   Postnasal drip/Seasonal allergies - patient concern of dry cough, not getting better with Flonase  Will add singular  Continue Flonase and Zyrtec  Follow up with PCP   Discharge Instructions  Discharge Instructions    Call MD for:  difficulty breathing, headache or visual disturbances    Complete by:  As directed    Call MD for:  extreme fatigue    Complete by:  As directed    Call MD for:  hives    Complete by:  As directed    Call MD for:  persistant dizziness or light-headedness    Complete by:  As directed    Call MD for:  persistant nausea and vomiting    Complete by:  As directed    Call MD for:  redness, tenderness, or signs of infection (pain, swelling, redness, odor or green/yellow discharge around incision site)    Complete by:  As directed    Call MD for:  severe uncontrolled pain    Complete by:  As directed    Call MD for:  temperature >100.4    Complete by:  As directed    Diet - low sodium heart healthy    Complete by:  As directed    Discharge instructions    Complete by:  As directed    Increase activity slowly    Complete by:  As directed      Allergies as of 05/02/2016      Reactions   Amoxil [  amoxicillin] Other (See Comments)   REACTION: C-diff   Augmentin [amoxicillin-pot Clavulanate] Other (See Comments)   REACTION: C-Diff   Ciprofloxacin Diarrhea   Demerol [meperidine] Nausea And Vomiting   Infliximab Nausea Only   Morphine And Related Nausea And Vomiting   Ace Inhibitors Cough      Medication List    TAKE these medications   amLODipine 10 MG tablet Commonly known as:  NORVASC Take 1 tablet (10 mg total) by mouth daily.   aspirin 81 MG EC tablet Take 1 tablet (81 mg total) by mouth daily.   atorvastatin 40 MG tablet Commonly known as:  LIPITOR Take 40 mg by  mouth daily.   cetirizine 10 MG tablet Commonly known as:  ZYRTEC Take 10 mg by mouth daily.   COLESTID 1 g tablet Generic drug:  colestipol Take 1 g by mouth 2 (two) times daily.   diphenoxylate-atropine 2.5-0.025 MG tablet Commonly known as:  LOMOTIL Take 2 tablets by mouth 2 (two) times daily as needed for diarrhea or loose stools.   fluticasone 50 MCG/ACT nasal spray Commonly known as:  FLONASE Place 1 spray into both nostrils daily.   hydrochlorothiazide 25 MG tablet Commonly known as:  HYDRODIURIL Take 1 tablet (25 mg total) by mouth every other day.   HYDROcodone-acetaminophen 5-325 MG tablet Commonly known as:  NORCO/VICODIN Take 1 tablet by mouth every 4 (four) hours as needed for moderate pain. What changed:  when to take this   metoprolol 200 MG 24 hr tablet Commonly known as:  TOPROL-XL Take 200 mg by mouth at bedtime.   montelukast 10 MG tablet Commonly known as:  SINGULAIR Take 1 tablet (10 mg total) by mouth at bedtime.   nitroGLYCERIN 0.4 MG SL tablet Commonly known as:  NITROSTAT Place 1 tablet (0.4 mg total) under the tongue every 5 (five) minutes x 3 doses as needed for chest pain.   omeprazole 40 MG capsule Commonly known as:  PRILOSEC Take 40 mg by mouth daily.   traZODone 100 MG tablet Commonly known as:  DESYREL Take 100 mg by mouth at bedtime.      Follow-up Information    [name], MD. Schedule an appointment as soon as possible for a visit in 2 week(s).   Specialty:  Cardiology Contact information: [address] [place] [address] [phone]        [name], MD. Schedule an appointment as soon as possible for a visit in 1 week(s).   Specialty:  Family Medicine Contact information: [address] [place] [address] [phone]          Allergies  Allergen Reactions  . Amoxil [Amoxicillin] Other (See Comments)    REACTION: C-diff  . Augmentin [Amoxicillin-Pot Clavulanate] Other  (See Comments)    REACTION: C-Diff  . Ciprofloxacin Diarrhea  . Demerol [Meperidine] Nausea And Vomiting  . Infliximab Nausea Only  . Morphine And Related Nausea And Vomiting  . Ace Inhibitors Cough    Consultations:  Cardiology   Procedures/Studies: Dg Ribs Unilateral W/chest Right  Result Date: 05/01/2016 CLINICAL DATA:  Right-sided chest pain after fall yesterday. Now with left-sided chest pain and shortness of breath. EXAM: RIGHT RIBS AND CHEST - 3+ VIEW COMPARISON:  Chest CT 04/28/2014 FINDINGS: No fracture or other bone lesions are seen involving the ribs. There is no evidence of pneumothorax or pleural effusion. Both lungs are clear. Left chest port tip in the SVC. Prominent cardiomediastinal contours likely secondary to lipomatosis as seen on  prior chest CT. IMPRESSION: No evidence of acute rib fracture.  No acute intrathoracic process. Electronically Signed   By: Jeb Levering M.D.   On: 05/01/2016 03:24   Dg Shoulder Right  Result Date: 05/01/2016 CLINICAL DATA:  Right-sided shoulder pain after fall yesterday. EXAM: RIGHT SHOULDER - 2+ VIEW COMPARISON:  None. FINDINGS: There is no evidence of fracture or dislocation. Chronic widening of the acromioclavicular joint compared with chest radiograph 04/27/2014. Mild glenohumeral osteoarthritis. Soft tissues are unremarkable. IMPRESSION: No acute fracture or subluxation of the right shoulder. Electronically Signed   By: Jeb Levering M.D.   On: 05/01/2016 03:26   Dg Knee Complete 4 Views Right  Result Date: 05/01/2016 CLINICAL DATA:  Right knee pain after fall yesterday. EXAM: RIGHT KNEE - COMPLETE 4+ VIEW COMPARISON:  None. FINDINGS: No evidence of fracture or dislocation. Mild tricompartmental osteoarthritis with peripheral spurring. Narrowing of the medial tibiofemoral joint space. Possible small joint effusion. Soft tissues are unremarkable. Tiny metallic density in the medial soft tissues appears chronic. IMPRESSION: 1. No  acute fracture or subluxation of the right knee. 2. Mild tricompartmental osteoarthritis. Possible small joint effusion. Electronically Signed   By: Jeb Levering M.D.   On: 05/01/2016 03:30   Dg Hip Unilat W Or Wo Pelvis 2-3 Views Right  Result Date: 05/01/2016 CLINICAL DATA:  Right hip pain after fall yesterday. EXAM: DG HIP (WITH OR WITHOUT PELVIS) 2-3V RIGHT COMPARISON:  None. FINDINGS: Three partially cannulated screws traverse the left femoral neck. No periprosthetic lucency. No evidence of acute fracture. Pubic rami appear intact. Pubic symphysis is congruent. Left hip arthroplasty appears intact where visualized. Aorto bi-iliac stent graft. Vascular coils in the region of the left external iliac. Right iliac stent. IMPRESSION: No acute fracture or subluxation of the right hip. Post surgical pinning of the right proximal femur with intact hardware. Electronically Signed   By: Jeb Levering M.D.   On: 05/01/2016 03:28    Discharge Exam: Vitals:   05/02/16 0413 05/02/16 1506  BP: 138/71   Pulse: 63   Resp: 15   Temp: 97.6 F (36.4 C) 98.2 F (36.8 C)   Vitals:   05/01/16 2145 05/01/16 2240 05/02/16 0413 05/02/16 1506  BP: (!) 115/58 125/70 138/71   Pulse: 63 64 63   Resp: 15  15   Temp: 97.8 F (36.6 C)  97.6 F (36.4 C) 98.2 F (36.8 C)  TempSrc: Oral  Oral Oral  SpO2: 93%  94% 92%  Weight:   116.3 kg (256 lb 6.4 oz)     General: Pt is alert, awake, not in acute distress Cardiovascular: RRR, S1/S2 +, no rubs, no gallops Respiratory: CTA bilaterally, no wheezing, no rhonchi Abdominal: Soft, NT, ND, bowel sounds + Extremities: no edema, no cyanosis   The results of significant diagnostics from this hospitalization (including imaging, microbiology, ancillary and laboratory) are listed below for reference.     Microbiology: No results found for this or any previous visit (from the past 240 hour(s)).   Labs: BNP (last 3 results) No results for input(s): BNP in  the last 8760 hours. Basic Metabolic Panel:  Recent Labs Lab 05/01/16 0430 05/01/16 0519  NA 137 138  K 4.7 4.7  CL 100* 101  CO2 27  --   GLUCOSE 150* 144*  BUN 24* 27*  CREATININE 1.77* 1.70*  CALCIUM 8.2*  --    Liver Function Tests: No results for input(s): AST, ALT, ALKPHOS, BILITOT, PROT, ALBUMIN in the last 168 hours.  No results for input(s): LIPASE, AMYLASE in the last 168 hours. No results for input(s): AMMONIA in the last 168 hours. CBC:  Recent Labs Lab 05/01/16 0430 05/01/16 0519  WBC 9.4  --   NEUTROABS 7.4  --   HGB 12.9* 13.6  HCT 41.4 40.0  MCV 87.0  --   PLT 92*  --    Cardiac Enzymes:  Recent Labs Lab 05/01/16 1017 05/01/16 1507  TROPONINI 0.03* <0.03   BNP: Invalid input(s): POCBNP CBG: No results for input(s): GLUCAP in the last 168 hours. D-Dimer No results for input(s): DDIMER in the last 72 hours. Hgb A1c  Recent Labs  05/01/16 1017  HGBA1C 6.5*   Lipid Profile  Recent Labs  05/01/16 1017  CHOL 119  HDL 58  LDLCALC 35  TRIG 128  CHOLHDL 2.1   Thyroid function studies  Recent Labs  05/01/16 1507  TSH 0.505   Anemia work up No results for input(s): VITAMINB12, FOLATE, FERRITIN, TIBC, IRON, RETICCTPCT in the last 72 hours. Urinalysis No results found for: COLORURINE, APPEARANCEUR, LABSPEC, [place], GLUCOSEU, HGBUR, BILIRUBINUR, KETONESUR, PROTEINUR, UROBILINOGEN, NITRITE, LEUKOCYTESUR Sepsis Labs Invalid input(s): PROCALCITONIN,  WBC,  LACTICIDVEN Microbiology No results found for this or any previous visit (from the past 240 hour(s)).  SIGNED:  Chipper Oman, MD  [facility] 05/02/2016, 5:02 PM Pager   If 7PM-7AM, please contact night-coverage [email or web address] Password [email or web address]

## 2016-05-02 NOTE — Progress Notes (Signed)
Patient Name: Bill Dunn Date of Encounter: 05/02/2016  Primary Cardiologist: Martinique  Hospital Problem List     Active Problems:   Acute chest pain   Crohn's disease ([place])   HTN (hypertension)   Hypertriglyceridemia   History of AAA (abdominal aortic aneurysm) repair   Pericarditis   History of TIA (transient ischemic attack)   Chest pain, negative MI    NSVT (nonsustained ventricular tachycardia) ([facility])   Bladder cancer ([facility])     Subjective   No further chest pain. Originally from [place], [place].  Inpatient Medications    Scheduled Meds: . amLODipine  10 mg Oral Daily  . aspirin  325 mg Oral Daily  . atorvastatin  40 mg Oral Daily  . colestipol  1 g Oral BID  . heparin  5,000 Units Subcutaneous Q8H  . hydrochlorothiazide  25 mg Oral QODAY  . metoprolol  200 mg Oral QHS  . pantoprazole  40 mg Oral Daily  . sodium chloride flush  10-40 mL Intracatheter Q12H  . traZODone  100 mg Oral QHS   Continuous Infusions:  PRN Meds: acetaminophen, diphenoxylate-atropine, fentaNYL (SUBLIMAZE) injection, gi cocktail, nitroGLYCERIN, ondansetron (ZOFRAN) IV, sodium chloride flush   Vital Signs    Vitals:   05/01/16 1400 05/01/16 2145 05/01/16 2240 05/02/16 0413  BP: (!) 135/105 (!) 115/58 125/70 138/71  Pulse: 69 63 64 63  Resp: 20 15  15   Temp: 98 F (36.7 C) 97.8 F (36.6 C)  97.6 F (36.4 C)  TempSrc: Oral Oral  Oral  SpO2: 95% 93%  94%  Weight:    256 lb 6.4 oz (116.3 kg)    Intake/Output Summary (Last 24 hours) at 05/02/16 0959 Last data filed at 05/02/16 0900  Gross per 24 hour  Intake              250 ml  Output             1050 ml  Net             -800 ml   Filed Weights   05/02/16 0413  Weight: 256 lb 6.4 oz (116.3 kg)    Physical Exam    GEN: Well nourished, well developed, in no acute distress.  HEENT: Grossly normal.  Neck: Supple, no JVD. Cardiac: RRR, no murmurs, rubs, or gallops. No clubbing, cyanosis, edema.   Respiratory:  Respirations  regular and unlabored, clear to auscultation bilaterally. GI: Soft, nontender, obese MS: no deformity or atrophy. Skin: warm and dry, no rash. Neuro:  Strength and sensation are intact. Psych: AAOx3.  Normal affect.  Labs    CBC  Recent Labs  05/01/16 0430 05/01/16 0519  WBC 9.4  --   NEUTROABS 7.4  --   HGB 12.9* 13.6  HCT 41.4 40.0  MCV 87.0  --   PLT 92*  --    Basic Metabolic Panel  Recent Labs  05/01/16 0430 05/01/16 0519  NA 137 138  K 4.7 4.7  CL 100* 101  CO2 27  --   GLUCOSE 150* 144*  BUN 24* 27*  CREATININE 1.77* 1.70*  CALCIUM 8.2*  --    Liver Function Tests No results for input(s): AST, ALT, ALKPHOS, BILITOT, PROT, ALBUMIN in the last 72 hours. No results for input(s): LIPASE, AMYLASE in the last 72 hours. Cardiac Enzymes  Recent Labs  05/01/16 1017 05/01/16 1507  TROPONINI 0.03* <0.03   BNP Invalid input(s): POCBNP D-Dimer No results for input(s): DDIMER in the last  72 hours. Hemoglobin A1C  Recent Labs  05/01/16 1017  HGBA1C 6.5*   Fasting Lipid Panel  Recent Labs  05/01/16 1017  CHOL 119  HDL 58  LDLCALC 35  TRIG 128  CHOLHDL 2.1   Thyroid Function Tests  Recent Labs  05/01/16 1507  TSH 0.505    Telemetry    NSR - Personally Reviewed  ECG     - Personally Reviewed  Radiology    Dg Ribs Unilateral W/chest Right  Result Date: 05/01/2016 CLINICAL DATA:  Right-sided chest pain after fall yesterday. Now with left-sided chest pain and shortness of breath. EXAM: RIGHT RIBS AND CHEST - 3+ VIEW COMPARISON:  Chest CT 04/28/2014 FINDINGS: No fracture or other bone lesions are seen involving the ribs. There is no evidence of pneumothorax or pleural effusion. Both lungs are clear. Left chest port tip in the SVC. Prominent cardiomediastinal contours likely secondary to lipomatosis as seen on prior chest CT. IMPRESSION: No evidence of acute rib fracture.  No acute intrathoracic process. Electronically Signed   By: Jeb Levering M.D.   On: 05/01/2016 03:24   Dg Shoulder Right  Result Date: 05/01/2016 CLINICAL DATA:  Right-sided shoulder pain after fall yesterday. EXAM: RIGHT SHOULDER - 2+ VIEW COMPARISON:  None. FINDINGS: There is no evidence of fracture or dislocation. Chronic widening of the acromioclavicular joint compared with chest radiograph 04/27/2014. Mild glenohumeral osteoarthritis. Soft tissues are unremarkable. IMPRESSION: No acute fracture or subluxation of the right shoulder. Electronically Signed   By: Jeb Levering M.D.   On: 05/01/2016 03:26   Dg Knee Complete 4 Views Right  Result Date: 05/01/2016 CLINICAL DATA:  Right knee pain after fall yesterday. EXAM: RIGHT KNEE - COMPLETE 4+ VIEW COMPARISON:  None. FINDINGS: No evidence of fracture or dislocation. Mild tricompartmental osteoarthritis with peripheral spurring. Narrowing of the medial tibiofemoral joint space. Possible small joint effusion. Soft tissues are unremarkable. Tiny metallic density in the medial soft tissues appears chronic. IMPRESSION: 1. No acute fracture or subluxation of the right knee. 2. Mild tricompartmental osteoarthritis. Possible small joint effusion. Electronically Signed   By: Jeb Levering M.D.   On: 05/01/2016 03:30   Dg Hip Unilat W Or Wo Pelvis 2-3 Views Right  Result Date: 05/01/2016 CLINICAL DATA:  Right hip pain after fall yesterday. EXAM: DG HIP (WITH OR WITHOUT PELVIS) 2-3V RIGHT COMPARISON:  None. FINDINGS: Three partially cannulated screws traverse the left femoral neck. No periprosthetic lucency. No evidence of acute fracture. Pubic rami appear intact. Pubic symphysis is congruent. Left hip arthroplasty appears intact where visualized. Aorto bi-iliac stent graft. Vascular coils in the region of the left external iliac. Right iliac stent. IMPRESSION: No acute fracture or subluxation of the right hip. Post surgical pinning of the right proximal femur with intact hardware. Electronically Signed   By: Jeb Levering M.D.   On: 05/01/2016 03:28    Cardiac Studies   Echo pending  Patient Profile     80 yr old male with h/o SVT admitted with chest pain  Assessment & Plan    1. Supraventricular tachycardia: Patient awoke early yesterday morning with chest discomfort. Per ER notes, he was found to be in SVT with a rate of 200. This broke spontaneously. Strips were not available for review. He does have a prior history of SVT and also nonsustained VT (seen during hospitalization in December 2015). He is on beta blocker therapy at home.  Continue beta blocker. Follow-up echocardiogram. TSH was normal.  2. Left-sided  chest pressure/nonobstructive coronary artery disease/elevated troponin: As above, patient awoke with chest pressure yesterday morning. He did not experience palpitations thus it is unclear if SVT preceded chest pressure. Troponin is mildly elevated at 0.03. ECG shows mild inferior ST segment elevation, which is unchanged from prior ECGs. Mild flat elevation likely represents demand ischemia in the setting of tachycardia. He does have a known history of nonobstructive CAD and provided echo w/o wall motion abnormalities, would plan conservative Rx - could consider outpt nuclear stress test if chest pain recurs.  With stage III chronic kidney disease and an admission creatinine of 1.7, he is not a good candidate for diagnostic catheterization. Continue aspirin, statin, and beta blocker.  3. Hypertensive heart disease: Blood pressure is stable. Continue beta blocker and amlodipine.  4. Hyperlipidemia/hypertriglyceridemia: Continue statin and colestipol therapy.  5.  CKD III: follow.  6.  Recent fall:  Lost balance while transitioning from lift chair to walker the other night resulting in right rib, hip, and knee pain. X-rays in ED yesterda were negative for acute fracture.  Dispo: If echocardiogram is ok, can discharge to home  Signed, Kate Sable, MD  05/02/2016, 9:59 AM

## 2016-09-04 ENCOUNTER — Encounter (HOSPITAL_COMMUNITY): Payer: Self-pay | Admitting: *Deleted

## 2016-09-04 ENCOUNTER — Inpatient Hospital Stay (HOSPITAL_COMMUNITY)
Admission: EM | Admit: 2016-09-04 | Discharge: 2016-09-10 | DRG: 388 | Disposition: A | Discharge status: Home / Self Care | Payer: Medicare Other | Attending: Nephrology | Admitting: Nephrology

## 2016-09-04 ENCOUNTER — Emergency Department (HOSPITAL_COMMUNITY): Payer: Medicare Other

## 2016-09-04 DIAGNOSIS — I129 Hypertensive chronic kidney disease with stage 1 through stage 4 chronic kidney disease, or unspecified chronic kidney disease: Secondary | ICD-10-CM | POA: Diagnosis present

## 2016-09-04 DIAGNOSIS — N183 Chronic kidney disease, stage 3 unspecified: Secondary | ICD-10-CM | POA: Diagnosis present

## 2016-09-04 DIAGNOSIS — Z8673 Personal history of transient ischemic attack (TIA), and cerebral infarction without residual deficits: Secondary | ICD-10-CM | POA: Diagnosis not present

## 2016-09-04 DIAGNOSIS — Z9049 Acquired absence of other specified parts of digestive tract: Secondary | ICD-10-CM

## 2016-09-04 DIAGNOSIS — Z9621 Cochlear implant status: Secondary | ICD-10-CM | POA: Diagnosis present

## 2016-09-04 DIAGNOSIS — R109 Unspecified abdominal pain: Secondary | ICD-10-CM | POA: Diagnosis present

## 2016-09-04 DIAGNOSIS — E781 Pure hyperglyceridemia: Secondary | ICD-10-CM | POA: Diagnosis present

## 2016-09-04 DIAGNOSIS — Z885 Allergy status to narcotic agent status: Secondary | ICD-10-CM

## 2016-09-04 DIAGNOSIS — Z8679 Personal history of other diseases of the circulatory system: Secondary | ICD-10-CM | POA: Diagnosis not present

## 2016-09-04 DIAGNOSIS — W19XXXD Unspecified fall, subsequent encounter: Secondary | ICD-10-CM

## 2016-09-04 DIAGNOSIS — Z8249 Family history of ischemic heart disease and other diseases of the circulatory system: Secondary | ICD-10-CM

## 2016-09-04 DIAGNOSIS — Z6838 Body mass index (BMI) 38.0-38.9, adult: Secondary | ICD-10-CM | POA: Diagnosis not present

## 2016-09-04 DIAGNOSIS — I10 Essential (primary) hypertension: Secondary | ICD-10-CM | POA: Diagnosis not present

## 2016-09-04 DIAGNOSIS — K56609 Unspecified intestinal obstruction, unspecified as to partial versus complete obstruction: Secondary | ICD-10-CM | POA: Diagnosis present

## 2016-09-04 DIAGNOSIS — R079 Chest pain, unspecified: Secondary | ICD-10-CM | POA: Diagnosis present

## 2016-09-04 DIAGNOSIS — D696 Thrombocytopenia, unspecified: Secondary | ICD-10-CM | POA: Diagnosis present

## 2016-09-04 DIAGNOSIS — K56600 Partial intestinal obstruction, unspecified as to cause: Principal | ICD-10-CM | POA: Diagnosis present

## 2016-09-04 DIAGNOSIS — Z981 Arthrodesis status: Secondary | ICD-10-CM | POA: Diagnosis not present

## 2016-09-04 DIAGNOSIS — A0472 Enterocolitis due to Clostridium difficile, not specified as recurrent: Secondary | ICD-10-CM

## 2016-09-04 DIAGNOSIS — Z7982 Long term (current) use of aspirin: Secondary | ICD-10-CM

## 2016-09-04 DIAGNOSIS — Z66 Do not resuscitate: Secondary | ICD-10-CM | POA: Diagnosis present

## 2016-09-04 DIAGNOSIS — J9621 Acute and chronic respiratory failure with hypoxia: Secondary | ICD-10-CM | POA: Diagnosis present

## 2016-09-04 DIAGNOSIS — K50919 Crohn's disease, unspecified, with unspecified complications: Secondary | ICD-10-CM | POA: Diagnosis not present

## 2016-09-04 DIAGNOSIS — K5090 Crohn's disease, unspecified, without complications: Secondary | ICD-10-CM | POA: Diagnosis present

## 2016-09-04 DIAGNOSIS — Z87891 Personal history of nicotine dependence: Secondary | ICD-10-CM | POA: Diagnosis not present

## 2016-09-04 DIAGNOSIS — Z8551 Personal history of malignant neoplasm of bladder: Secondary | ICD-10-CM

## 2016-09-04 DIAGNOSIS — Z881 Allergy status to other antibiotic agents status: Secondary | ICD-10-CM

## 2016-09-04 DIAGNOSIS — Z0189 Encounter for other specified special examinations: Secondary | ICD-10-CM

## 2016-09-04 DIAGNOSIS — I2510 Atherosclerotic heart disease of native coronary artery without angina pectoris: Secondary | ICD-10-CM | POA: Diagnosis present

## 2016-09-04 DIAGNOSIS — Z888 Allergy status to other drugs, medicaments and biological substances status: Secondary | ICD-10-CM

## 2016-09-04 DIAGNOSIS — R072 Precordial pain: Secondary | ICD-10-CM | POA: Diagnosis not present

## 2016-09-04 DIAGNOSIS — H9190 Unspecified hearing loss, unspecified ear: Secondary | ICD-10-CM | POA: Diagnosis present

## 2016-09-04 DIAGNOSIS — K219 Gastro-esophageal reflux disease without esophagitis: Secondary | ICD-10-CM | POA: Diagnosis present

## 2016-09-04 LAB — COMPREHENSIVE METABOLIC PANEL
ALK PHOS: 54 U/L (ref 38–126)
ALT: 35 U/L (ref 17–63)
AST: 30 U/L (ref 15–41)
Albumin: 3.9 g/dL (ref 3.5–5.0)
Anion gap: 12 (ref 5–15)
BUN: 28 mg/dL — ABNORMAL HIGH (ref 6–20)
CALCIUM: 9.6 mg/dL (ref 8.9–10.3)
CO2: 28 mmol/L (ref 22–32)
CREATININE: 1.91 mg/dL — AB (ref 0.61–1.24)
Chloride: 95 mmol/L — ABNORMAL LOW (ref 101–111)
GFR calc non Af Amer: 32 mL/min — ABNORMAL LOW (ref >60)
GFR, EST AFRICAN AMERICAN: 37 mL/min — AB (ref >60)
GLUCOSE: 132 mg/dL — AB (ref 65–99)
Potassium: 3.9 mmol/L (ref 3.5–5.1)
SODIUM: 135 mmol/L (ref 135–145)
Total Bilirubin: 1.2 mg/dL (ref 0.3–1.2)
Total Protein: 7.2 g/dL (ref 6.5–8.1)

## 2016-09-04 LAB — URINALYSIS, ROUTINE W REFLEX MICROSCOPIC
Bacteria, UA: NONE SEEN
Bilirubin Urine: NEGATIVE
GLUCOSE, UA: NEGATIVE mg/dL
HGB URINE DIPSTICK: NEGATIVE
Ketones, ur: NEGATIVE mg/dL
Leukocytes, UA: NEGATIVE
Nitrite: NEGATIVE
PH: 5 (ref 5.0–8.0)
Protein, ur: 30 mg/dL — AB
SPECIFIC GRAVITY, URINE: 1.016 (ref 1.005–1.030)

## 2016-09-04 LAB — CBC
HCT: 44.3 % (ref 39.0–52.0)
Hemoglobin: 14.4 g/dL (ref 13.0–17.0)
MCH: 28.2 pg (ref 26.0–34.0)
MCHC: 32.5 g/dL (ref 30.0–36.0)
MCV: 86.9 fL (ref 78.0–100.0)
PLATELETS: 138 10*3/uL — AB (ref 150–400)
RBC: 5.1 MIL/uL (ref 4.22–5.81)
RDW: 14.1 % (ref 11.5–15.5)
WBC: 8.2 10*3/uL (ref 4.0–10.5)

## 2016-09-04 LAB — LIPASE, BLOOD: Lipase: 42 U/L (ref 11–51)

## 2016-09-04 MED ORDER — MONTELUKAST SODIUM 10 MG PO TABS: 10.0000 mg | ORAL_TABLET | Freq: Every day | ORAL | Status: DC

## 2016-09-04 MED ORDER — TRAZODONE HCL 50 MG PO TABS
200.0000 mg | ORAL_TABLET | Freq: Every day | ORAL | Status: DC
  Administered 2016-09-05: 200 mg via ORAL
  Filled 2016-09-04: qty 4

## 2016-09-04 MED ORDER — AMLODIPINE BESYLATE 5 MG PO TABS: 10.0000 mg | ORAL_TABLET | Freq: Every day | ORAL | Status: DC

## 2016-09-04 MED ORDER — SODIUM CHLORIDE 0.9 % IV SOLN
INTRAVENOUS | Status: DC
Start: 2016-09-04 — End: 2016-09-08
  Administered 2016-09-05 – 2016-09-08 (×7): via INTRAVENOUS

## 2016-09-04 MED ORDER — METOPROLOL SUCCINATE ER 100 MG PO TB24
200.0000 mg | ORAL_TABLET | Freq: Every day | ORAL | Status: DC
  Administered 2016-09-05: 200 mg via ORAL
  Filled 2016-09-04: qty 2

## 2016-09-04 MED ORDER — ZOLPIDEM TARTRATE 5 MG PO TABS: 5.0000 mg | ORAL_TABLET | Freq: Every evening | ORAL | Status: DC | PRN

## 2016-09-04 MED ORDER — NITROGLYCERIN 0.4 MG SL SUBL: 0.4000 mg | SUBLINGUAL_TABLET | SUBLINGUAL | Status: DC | PRN

## 2016-09-04 MED ORDER — IOPAMIDOL (ISOVUE-300) INJECTION 61%
INTRAVENOUS | Status: AC
  Filled 2016-09-04: qty 100

## 2016-09-04 MED ORDER — HYDROMORPHONE HCL 1 MG/ML IJ SOLN
0.5000 mg | INTRAMUSCULAR | Status: DC | PRN
  Administered 2016-09-05 – 2016-09-09 (×15): 0.5 mg via INTRAVENOUS
  Filled 2016-09-04 (×16): qty 1

## 2016-09-04 MED ORDER — ACETAMINOPHEN 325 MG PO TABS: 650.0000 mg | ORAL_TABLET | Freq: Four times a day (QID) | ORAL | Status: DC | PRN

## 2016-09-04 MED ORDER — CYCLOBENZAPRINE HCL 10 MG PO TABS: 5.0000 mg | ORAL_TABLET | Freq: Three times a day (TID) | ORAL | Status: DC | PRN

## 2016-09-04 MED ORDER — FENTANYL CITRATE (PF) 100 MCG/2ML IJ SOLN
50.0000 ug | Freq: Once | INTRAMUSCULAR | Status: AC
  Administered 2016-09-04: 50 ug via INTRAVENOUS
  Filled 2016-09-04: qty 2

## 2016-09-04 MED ORDER — ATORVASTATIN CALCIUM 80 MG PO TABS: 80.0000 mg | ORAL_TABLET | Freq: Every day | ORAL | Status: DC

## 2016-09-04 MED ORDER — METOCLOPRAMIDE HCL 5 MG/ML IJ SOLN
5.0000 mg | Freq: Once | INTRAMUSCULAR | Status: AC
  Administered 2016-09-04: 5 mg via INTRAVENOUS
  Filled 2016-09-04: qty 2

## 2016-09-04 MED ORDER — ONDANSETRON HCL 4 MG/2ML IJ SOLN
4.0000 mg | Freq: Three times a day (TID) | INTRAMUSCULAR | Status: DC | PRN
  Administered 2016-09-08 (×2): 4 mg via INTRAVENOUS
  Filled 2016-09-04 (×2): qty 2

## 2016-09-04 MED ORDER — HYDRALAZINE HCL 20 MG/ML IJ SOLN: 5.0000 mg | INTRAMUSCULAR | Status: DC | PRN

## 2016-09-04 MED ORDER — IOPAMIDOL (ISOVUE-300) INJECTION 61%
INTRAVENOUS | Status: AC
  Filled 2016-09-04: qty 30

## 2016-09-04 MED ORDER — HYDROCODONE-ACETAMINOPHEN 5-325 MG PO TABS: 1.0000 | ORAL_TABLET | ORAL | Status: DC | PRN

## 2016-09-04 MED ORDER — FAMOTIDINE IN NACL 20-0.9 MG/50ML-% IV SOLN
20.0000 mg | Freq: Two times a day (BID) | INTRAVENOUS | Status: DC
  Administered 2016-09-05 – 2016-09-08 (×8): 20 mg via INTRAVENOUS
  Filled 2016-09-04 (×9): qty 50

## 2016-09-04 MED ORDER — COLESTIPOL HCL 1 G PO TABS
1.0000 g | ORAL_TABLET | Freq: Two times a day (BID) | ORAL | Status: DC
  Filled 2016-09-04 (×2): qty 1

## 2016-09-04 MED ORDER — HYDROMORPHONE HCL 1 MG/ML IJ SOLN
1.0000 mg | Freq: Once | INTRAMUSCULAR | Status: AC
  Administered 2016-09-04: 1 mg via INTRAVENOUS
  Filled 2016-09-04: qty 1

## 2016-09-04 MED ORDER — ACETAMINOPHEN 650 MG RE SUPP: 650.0000 mg | Freq: Four times a day (QID) | RECTAL | Status: DC | PRN

## 2016-09-04 MED ORDER — SODIUM CHLORIDE 0.9 % IV SOLN
INTRAVENOUS | Status: DC
  Administered 2016-09-04: 125 mL/h via INTRAVENOUS

## 2016-09-04 MED ORDER — SODIUM CHLORIDE 0.9% FLUSH
3.0000 mL | Freq: Two times a day (BID) | INTRAVENOUS | Status: DC
  Administered 2016-09-08 – 2016-09-10 (×2): 3 mL via INTRAVENOUS

## 2016-09-04 MED ORDER — ASPIRIN EC 81 MG PO TBEC: 81.0000 mg | DELAYED_RELEASE_TABLET | Freq: Every day | ORAL | Status: DC

## 2016-09-04 MED ORDER — DM-GUAIFENESIN ER 30-600 MG PO TB12
1.0000 | ORAL_TABLET | Freq: Two times a day (BID) | ORAL | Status: DC | PRN
  Filled 2016-09-04: qty 1

## 2016-09-04 MED ORDER — ALBUTEROL SULFATE (2.5 MG/3ML) 0.083% IN NEBU: 2.5000 mg | INHALATION_SOLUTION | RESPIRATORY_TRACT | Status: DC | PRN

## 2016-09-04 NOTE — ED Notes (Signed)
Pt did not react well to dilaudid and reglan.  Seems to be zoning out.  EDP notified.

## 2016-09-04 NOTE — H&P (Signed)
History and Physical    Bill Dunn HEN:[id number] DOB: 1935-08-12 DOA: 09/04/2016  Referring MD/NP/PA:   PCP: [name], MD   Patient coming from:  The patient is coming from home.  At baseline, pt is independent for most of ADL.   Chief Complaint: Abdominal pain, nausea, abdominal distention, chest pain and shortness of breath  HPI: Bill Dunn is a 81 y.o. male with medical history significant of Crohn's disease, hypertension, hyperlipidemia, TIA, SVT, obesity, chronic kidney disease-stage III, bladder cancer, AAA (s/p of repair 2012), multiple SBO, who presents with abdominal pain, nausea, abdominal distention, chest pain and SOB.  Patient states that he has been having intermittent abdominal pain for 4 days, which has worsened today. He also has abdominal distention, nausea, but no vomiting. His last bowel movement was this afternoon. He is not passing any gas.  Patient also reports chest pain and shortness of breath. His chest pain is located in the bilateral upper chest, constant, 10 out of 10 in severity earlier, which has resolved when I saw pt in ED. Patient has chronic cough with small amount of yellow colored sputum production. No runny nose or sore throat. No fever or chills. Patient denies symptoms of UTI or unilateral weakness. Pt had oxygen desaturation to 87% on room air in ED, which improved to 99% on 2 L nasal cannula oxygen  ED Course: pt was found to have negative troponin, WBC 8.2, BNP 40.7, negative urinalysis, slightly worsening renal function, temperature normal, no tachycardia. Pending CXR. Pt is admitted to telemetry bed as inpatient.  CT-abdomen/pelvis:  1. Dilated to duodenal and jejunal loops up to 2.9 cm with transition point in the right lower quadrant possibly from a short segment stricture or remotely adhesion given history of Crohn disease and surgical changes seen in the right lower quadrant. 2. Simple and complex bilateral renal cysts which  could be further correlated with MRI on a nonemergent basis for better characterization. 3. Aortoiliac stent grafts as above. Right internal iliac stent traversing a 2.2 cm aneurysm. 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 4. Cholecystectomy.  Review of Systems:   General: no fevers, chills, no changes in body weight, has poor appetite, has fatigue HEENT: no blurry vision, hearing changes or sore throat Respiratory: has dyspnea, coughing, no wheezing CV: has chest pain, no palpitations GI: no nausea, abdominal pain, no diarrhea, constipation, vomiting GU: no dysuria, burning on urination, increased urinary frequency, hematuria  Ext: has leg edema Neuro: no unilateral weakness, numbness, or tingling, no vision change or hearing loss Skin: no rash, no skin tear. MSK: No muscle spasm, no deformity, no limitation of range of movement in spin Heme: No easy bruising.  Travel history: No recent long distant travel.  Allergy:  Allergies  Allergen Reactions  . Amoxil [Amoxicillin] Other (See Comments)    REACTION: C-diff  . Augmentin [Amoxicillin-Pot Clavulanate] Other (See Comments)    REACTION: C-Diff  . Dilaudid [Hydromorphone Hcl] Shortness Of Breath and Other (See Comments)    Stomach pain - reaction to injection of dilaudid and reglan together  . Reglan [Metoclopramide] Shortness Of Breath and Other (See Comments)    Stomach pain - reaction to injection of dilaudid and reglan together  . Ciprofloxacin Diarrhea and Other (See Comments)    Caused C-diff  . Demerol [Meperidine] Nausea And Vomiting  . Infliximab Nausea Only    Reaction to Remicade/ flu like symptoms  . Morphine And Related Nausea  And Vomiting  . Sulfamethoxazole-Trimethoprim Other (See Comments)    Caused renal failure in combination with Prednisone  . Vitamin D Analogs Rash  . Levaquin [Levofloxacin] Other (See Comments)    Joint pain  . Ace Inhibitors  Cough    Reaction to ramipril    Past Medical History:  Diagnosis Date  . AAA (abdominal aortic aneurysm) ([place])    a. s/p stent grafting in 2012, in [place], [place], per pt.  . Arthritis   . Bladder cancer ([place])   . Chronic bronchitis ([place])   . CKD (chronic kidney disease), stage III   . Crohn disease ([place])    a. s/p multiple bowel resections;  b. s/p portacath.  . Disability of walking    a. no feeling in right leg since surgical complication ~ 4098 - predominantly uses w/c.  Marland Kitchen Dysphagia    a. 03/2016 EGD: nl EGD. Esoph dil performed.  Marland Kitchen HTN (hypertension)   . Hypertriglyceridemia   . Lung nodule    a. RML -->stable dating back to 02/2014.  . Morbid obesity ([place])   . Pericarditis 04/13/2013  . Pleuritic chest pain    a. s/p reportedly nl cath in 2012, [place], [place];  b. 04/2013 Cath: LM nl, LAD 3m, D1 nl, LCX nl, OM1 20, OM2 sm 43m, RCA 40p/m, 20d, EF 55-65%;  c. 04/2013 Echo: EF 55%, no rwma;  d. chest pain improved with colchicine.  . Sepsis ([place]) ? 2002  . SVT (supraventricular tachycardia) ([place])    a. 04/2013->bb therapy.  Marland Kitchen TIA (transient ischemic attack)    a. x 2, last ~ 2002    Past Surgical History:  Procedure Laterality Date  . ABDOMINAL AORTIC ANEURYSM REPAIR     a. reported stenting 2012  . APPENDECTOMY    . CHOLECYSTECTOMY    . COCHLEAR IMPLANT    . COLON SURGERY     a. multiple bowel resections 2/2 chrohn's dzs.  . INGUINAL HERNIA REPAIR    . LEFT HEART CATHETERIZATION WITH CORONARY ANGIOGRAM Bilateral 04/13/2013   Procedure: LEFT HEART CATHETERIZATION WITH CORONARY ANGIOGRAM;  Surgeon: Peter M Martinique, MD;  Location: [facility] CATH LAB;  Service: Cardiovascular;  Laterality: Bilateral;  . Left Shoulder Surgery    . RENAL CYST EXCISION    . spinal fusion      Social History:  reports that he quit smoking about 25 years ago. He has quit using smokeless tobacco. He reports that he does not drink alcohol or use drugs.  Family History:  Family History    Problem Relation Age of Onset  . CAD Father     died @ 69  . CAD Mother     died @ 57     Prior to Admission medications   Medication Sig Start Date End Date Taking? Authorizing Provider  albuterol (PROVENTIL HFA;VENTOLIN HFA) 108 (90 Base) MCG/ACT inhaler Inhale 2 puffs into the lungs every 6 (six) hours as needed for wheezing or shortness of breath.   Yes Historical Provider, MD  amLODipine (NORVASC) 10 MG tablet Take 1 tablet (10 mg total) by mouth daily. 04/28/14  Yes Isaiah Serge, NP  atorvastatin (LIPITOR) 80 MG tablet Take 80 mg by mouth at bedtime.   Yes Historical Provider, MD  colestipol (COLESTID) 1 G tablet Take 1 g by mouth 2 (two) times daily.   Yes Historical Provider, MD  cyclobenzaprine (FLEXERIL) 5 MG tablet Take 5 mg by mouth 3 (three) times daily as needed for muscle spasms.  Yes Historical Provider, MD  diphenoxylate-atropine (LOMOTIL) 2.5-0.025 MG per tablet Take 1 tablet by mouth 2 (two) times daily.    Yes Historical Provider, MD  hydrochlorothiazide (HYDRODIURIL) 25 MG tablet Take 1 tablet (25 mg total) by mouth every other day. Patient taking differently: Take 12.5 mg by mouth daily.  05/02/16  Yes Doreatha Lew, MD  HYDROcodone-acetaminophen (NORCO/VICODIN) 5-325 MG tablet Take 1 tablet by mouth every 4 (four) hours as needed for moderate pain. 05/02/16  Yes Doreatha Lew, MD  metoprolol (TOPROL-XL) 200 MG 24 hr tablet Take 200 mg by mouth at bedtime.   Yes Historical Provider, MD  montelukast (SINGULAIR) 10 MG tablet Take 1 tablet (10 mg total) by mouth at bedtime. 05/02/16  Yes Doreatha Lew, MD  nitroGLYCERIN (NITROSTAT) 0.4 MG SL tablet Place 1 tablet (0.4 mg total) under the tongue every 5 (five) minutes x 3 doses as needed for chest pain. 04/14/13  Yes Rogelia Mire, NP  traZODone (DESYREL) 100 MG tablet Take 200 mg by mouth at bedtime.    Yes Historical Provider, MD  aspirin EC 81 MG EC tablet Take 1 tablet (81 mg total) by mouth  daily. Patient not taking: Reported on 05/01/2016 04/28/14   Isaiah Serge, NP    Physical Exam: Vitals:   09/04/16 2245 09/04/16 2300 09/04/16 2315 09/05/16 0003  BP: 132/77 125/85 (!) 144/77 136/76  Pulse: 75 67 69 70  Resp:   14 16  Temp:    99 F (37.2 C)  TempSrc:    Oral  SpO2: 96% 94% 95% 95%  Weight:    115.6 kg (254 lb 13.6 oz)  Height:    5' 8.5" (1.74 m)   General: Not in acute distress HEENT:       Eyes: PERRL, EOMI, no scleral icterus.       ENT: No discharge from the ears and nose, no pharynx injection, no tonsillar enlargement.        Neck: No JVD, no bruit, no mass felt. Heme: No neck lymph node enlargement. Cardiac: S1/S2, RRR, No murmurs, No gallops or rubs. Respiratory: No rales, wheezing, rhonchi or rubs. GI: distended, diffused tender, no rebound pain, no organomegaly, BS present. GU: No hematuria Ext: has trace leg edema bilaterally. 2+DP/PT pulse bilaterally. Musculoskeletal: No joint deformities, No joint redness or warmth, no limitation of ROM in spin. Skin: No rashes.  Neuro: Alert, oriented X3, cranial nerves II-XII grossly intact, moves all extremities normally. Psych: Patient is not psychotic, no suicidal or hemocidal ideation.  Labs on Admission: I have personally reviewed following labs and imaging studies  CBC:  Recent Labs Lab 09/04/16 1920  WBC 8.2  HGB 14.4  HCT 44.3  MCV 86.9  PLT 528*   Basic Metabolic Panel:  Recent Labs Lab 09/04/16 1920  NA 135  K 3.9  CL 95*  CO2 28  GLUCOSE 132*  BUN 28*  CREATININE 1.91*  CALCIUM 9.6   GFR: Estimated Creatinine Clearance: 39.7 mL/min (A) (by C-G formula based on SCr of 1.91 mg/dL (H)). Liver Function Tests:  Recent Labs Lab 09/04/16 1920  AST 30  ALT 35  ALKPHOS 54  BILITOT 1.2  PROT 7.2  ALBUMIN 3.9    Recent Labs Lab 09/04/16 1920  LIPASE 42   No results for input(s): AMMONIA in the last 168 hours. Coagulation Profile:  Recent Labs Lab 09/05/16 0039   INR 1.12   Cardiac Enzymes:  Recent Labs Lab 09/05/16 0039  TROPONINI <0.03  BNP (last 3 results) No results for input(s): PROBNP in the last 8760 hours. HbA1C: No results for input(s): HGBA1C in the last 72 hours. CBG: No results for input(s): GLUCAP in the last 168 hours. Lipid Profile: No results for input(s): CHOL, HDL, LDLCALC, TRIG, CHOLHDL, LDLDIRECT in the last 72 hours. Thyroid Function Tests: No results for input(s): TSH, T4TOTAL, FREET4, T3FREE, THYROIDAB in the last 72 hours. Anemia Panel: No results for input(s): VITAMINB12, FOLATE, FERRITIN, TIBC, IRON, RETICCTPCT in the last 72 hours. Urine analysis:    Component Value Date/Time   COLORURINE YELLOW 09/04/2016 [place] 09/04/2016 1749   LABSPEC 1.016 09/04/2016 1749   PHURINE 5.0 09/04/2016 1749   GLUCOSEU NEGATIVE 09/04/2016 1749   HGBUR NEGATIVE 09/04/2016 [place] 09/04/2016 1749   KETONESUR NEGATIVE 09/04/2016 1749   PROTEINUR 30 (A) 09/04/2016 1749   NITRITE NEGATIVE 09/04/2016 1749   LEUKOCYTESUR NEGATIVE 09/04/2016 1749   Sepsis Labs: @LABRCNTIP (procalcitonin:4,lacticidven:4) )No results found for this or any previous visit (from the past 240 hour(s)).   Radiological Exams on Admission: Ct Abdomen Pelvis Wo Contrast  Result Date: 09/04/2016 CLINICAL DATA:  Crohn disease with abdominal pain for 4 days. Abdominal distention and vomiting since yesterday. EXAM: CT ABDOMEN AND PELVIS WITHOUT CONTRAST TECHNIQUE: Multidetector CT imaging of the abdomen and pelvis was performed following the standard protocol without IV contrast. COMPARISON:  None. FINDINGS: Lower chest: The visualized cardiac chambers are mildly enlarged. No pericardial effusion. Bibasilar dependent atelectasis is identified. There is no pneumothorax. Minimal scarring along the periphery of the right lower lobe. Hepatobiliary: Cholecystectomy with reservoir effect likely accounting for the mild  intrahepatic ductal dilatation. No space-occupying mass of the unenhanced liver is noted. Pancreas: Normal Spleen: Normal in size without mass.  Small adjacent splenule. Adrenals/Urinary Tract: The adrenal glands are normal bilaterally. There are circumscribed simple and complex cysts noted off the kidneys bilaterally some of which are hyperdense and may represent hemorrhagic or proteinaceous cysts, example series 3, image 44 off the interpolar left kidney measuring 1.6 cm. Others such as a 4.5 cm right lower pole cyst appear to containing milk-of-calcium along the dependent aspect. No nephrolithiasis or hydronephrosis. Further characterization is not possible given lack of IV contrast. Stomach/Bowel: The stomach is mildly distended with fluid. There is mild-to-moderate dilatation of small intestine starting from the third portion of the duodenum through much of the jejunum with fluid-filled dilated small bowel loops measuring up to 2.9 cm in caliber. There appears be a transition point in the right lower quadrant for which a short segment stricture is not excluded contributing to early changes of small bowel obstruction. Beyond this, the small bowel appears decompressed leading up to an enterocolic anastomosis in the right lower quadrant. No significant mural or fold thickening to suggest acute inflammatory bowel or definite tethering to suggest an adhesion. Vascular/Lymphatic: Aortic and branch vessel atherosclerosis is identified at the origins of the celiac, SMA and both renal arteries. An infrarenal aortic stent graft is identified extending into both common iliac arteries with additional separate right internal iliac artery stent noted secondary to a 2.2 cm aneurysm along the distal internal iliac artery. 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 No lymphadenopathy by CT size criteria is identified. Reproductive: Prostate is  unremarkable. Other: No abdominal wall hernia or abnormality. No abdominopelvic ascites. Musculoskeletal: Atrophy of the right rectus muscle with overlying skin staples. Cannulated screw fixation of the  right femoral neck with left hip arthroplasty. No acute nor suspicious osseous lesions. IMPRESSION: 1. Dilated to duodenal and jejunal loops up to 2.9 cm with transition point in the right lower quadrant possibly from a short segment stricture or remotely adhesion given history of Crohn disease and surgical changes seen in the right lower quadrant. No bowel perforation, ascites or free air. No evidence of acute inflammatory bowel. 2. Simple and complex bilateral renal cysts which could be further correlated with MRI on a nonemergent basis for better characterization. 3. Aortoiliac stent grafts as above. Right internal iliac stent traversing a 2.2 cm aneurysm. 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 4. Cholecystectomy. Electronically Signed   By: Ashley Royalty M.D.   On: 09/04/2016 21:55     EKG: Independently reviewed.  The initial EKG showed questionable ST elevation in inferior leads and V4-V6 .It is most likely due to artificial effects, since the EKG change is not consistent even in the same lead. The repeated EKG showed sinus rhythm, QTC 456, early R-wave progression, low voltage, no ischemic change.   Assessment/Plan Principal Problem:   SBO (small bowel obstruction) (HCC) Active Problems:   Crohn's disease (HCC)   HTN (hypertension)   Hypertriglyceridemia   History of TIA (transient ischemic attack)   Chest pain   Gastroesophageal reflux disease without esophagitis   Acute on chronic respiratory failure with hypoxia (HCC)   CKD (chronic kidney disease), stage III   SBO (small bowel obstruction) ([place]): This is recurrent issue. Patient is clinically nonseptic. No fever or leukocytosis. Hemodynamically stable. Gen. surgeon, Dr.  Rush Farmer was consulted.  -Admit to tele bed as inp -NPO  -prn NG tube (not started yet) - prn Dilaudid for pain -Prn Zofran prn nausea  -IVF: NS 125 cc/h -INR/PTT/type & screen -Follow-up general surgeons recommendation  Chest pain and acute on chronic respiratory failure with hypoxia: Etiology is not clear. Patient has some risk of getting blood clots since patient has not been active recently per his daughter. Pt states that he dose not want to take any blood thinner except for baby ASA. Even if he has blood clot or pulmonary embolism, he will not use any heparin, Lovenox, or any other type of blood thinner. I asked him to think over in the presence of his daughter, he sates that he is very clear that he does not want to start any blood thinner. He refused any further work up for diagnosis of pulmonary embolism. He refused d-dimer and VQ scan. He clearly understand the risk of missing the diagnosis of pulmonary embolism, including death. The initial EKG showed questionable ST elevation in inferior leads and V4-V6 .It is most likely due to artificial effects, since the EKG change is not consistent even in the same lead. The repeated EKG showed sinus rhythm, QTC 456, early R-wave progression, low voltage, no ischemic change. - cycle CE q6 x3 and repeat EKG in the am  - Nitroglycerin, Morphine, and aspirin, lipitor, and metoprolol - Risk factor stratification: will check FLP and A1C  - 2d echo - get CXR - prn albuterol nebs - prn mucinex for cough - check BNP  HTN: -continue amlodipine, metoprolol -Hold HCTZ since patient needed IV fluids -IVF hydralazine when necessary-  Hypertriglyceridemia: -colestipol  History of TIA (transient ischemic attack): -ASA and lipitor  GERD: -Pepcid IV  CKD (chronic kidney disease), stage III: Baseline creatinine 1.7, his creatinine is 1.91, BUN 28, slightly worsening. -Follow  up renal function by BMP   DVT ppx: SCD Code Status: DNR (I  discussed with patient in the presence of her daugher, and explained the meaning of CODE STATUS. Patient would want to be DNR) Family Communication:  Yes, patient's daughter at bed side Disposition Plan:  Anticipate discharge back to previous home environment Consults called:  Gen. surgeon, Dr. Stann Mainland Admission status:  Inpatient/tele    Date of Service 09/05/2016    Ivor Costa [facility] Pager [phone]  If 7PM-7AM, please contact night-coverage [email or web address] Password [email or web address] 09/05/2016, 3:52 AM

## 2016-09-04 NOTE — ED Notes (Signed)
Pt's O2 dropped to 87% after getting dilaudid and reglan. Placed patient on 2L and now at 99%.

## 2016-09-04 NOTE — ED Triage Notes (Signed)
The pt has crohns disease and he has had abd pain for 4 days  His abd has been distended since yesterday

## 2016-09-04 NOTE — ED Notes (Signed)
On way to CT 

## 2016-09-04 NOTE — ED Notes (Signed)
EDP does not want patient to wait for CT scan because of having to drink contrast.  Once creatinine and bun come back, EDP wants patient to go straight to CT

## 2016-09-04 NOTE — ED Notes (Signed)
Patient has power port.

## 2016-09-04 NOTE — ED Notes (Signed)
Called three times and still have not been able to give report.

## 2016-09-04 NOTE — ED Provider Notes (Addendum)
[facility] DEPT Provider Note   CSN: [id number] Arrival date & time: 09/04/16  1619     History   Chief Complaint Chief Complaint  Patient presents with  . Abdominal Pain    HPI Bill Dunn is a 81 y.o. male.  HPI Complains of abdomen feeling distended, feels nauseated and complains of diffuse abdominal pain gradual onset for the past 3 or 4 days. Last bowel movement was yesterday. He reports that he's passed a slight amount of gas per him. He denies nausea. He admits to diminished appetite. No treatment prior to coming here. Symptoms feel similar bowel obstructions she's had in the past Past Medical History:  Diagnosis Date  . AAA (abdominal aortic aneurysm) ([place])    a. s/p stent grafting in 2012, in [place], [place], per pt.  . Arthritis   . Bladder cancer ([place])   . Chronic bronchitis ([place])   . CKD (chronic kidney disease), stage III   . Crohn disease ([place])    a. s/p multiple bowel resections;  b. s/p portacath.  . Disability of walking    a. no feeling in right leg since surgical complication ~ 7510 - predominantly uses w/c.  Marland Kitchen Dysphagia    a. 03/2016 EGD: nl EGD. Esoph dil performed.  Marland Kitchen HTN (hypertension)   . Hypertriglyceridemia   . Lung nodule    a. RML -->stable dating back to 02/2014.  . Morbid obesity ([place])   . Pericarditis 04/13/2013  . Pleuritic chest pain    a. s/p reportedly nl cath in 2012, [place], [place];  b. 04/2013 Cath: LM nl, LAD 9m, D1 nl, LCX nl, OM1 20, OM2 sm 20m, RCA 40p/m, 20d, EF 55-65%;  c. 04/2013 Echo: EF 55%, no rwma;  d. chest pain improved with colchicine.  . Sepsis ([place]) ? 2002  . SVT (supraventricular tachycardia) ([place])    a. 04/2013->bb therapy.  Marland Kitchen TIA (transient ischemic attack)    a. x 2, last ~ 2002    Patient Active Problem List   Diagnosis Date Noted  . Fall   . Bladder cancer ([place]) 05/01/2016  . Other hyperlipidemia   . Gastroesophageal reflux disease without esophagitis   . NSVT (nonsustained ventricular  tachycardia) ([place]) 04/28/2014  . Chest pain, negative MI  04/27/2014  . Pleuritic chest pain 04/14/2013  . Crohn's disease ([place]) 04/14/2013  . HTN (hypertension) 04/14/2013  . Hypertriglyceridemia 04/14/2013  . History of AAA (abdominal aortic aneurysm) repair 04/14/2013  . Pericarditis 04/14/2013  . History of TIA (transient ischemic attack) 04/14/2013  . Acute chest pain 04/13/2013  . Acute pericarditis 04/13/2013    Past Surgical History:  Procedure Laterality Date  . ABDOMINAL AORTIC ANEURYSM REPAIR     a. reported stenting 2012  . APPENDECTOMY    . CHOLECYSTECTOMY    . COCHLEAR IMPLANT    . COLON SURGERY     a. multiple bowel resections 2/2 chrohn's dzs.  . INGUINAL HERNIA REPAIR    . LEFT HEART CATHETERIZATION WITH CORONARY ANGIOGRAM Bilateral 04/13/2013   Procedure: LEFT HEART CATHETERIZATION WITH CORONARY ANGIOGRAM;  Surgeon: Peter M Martinique, MD;  Location: [facility] CATH LAB;  Service: Cardiovascular;  Laterality: Bilateral;  . Left Shoulder Surgery    . RENAL CYST EXCISION    . spinal fusion         Home Medications    Prior to Admission medications   Medication Sig Start Date End Date Taking? Authorizing Provider  amLODipine (NORVASC) 10 MG tablet Take 1 tablet (10 mg  total) by mouth daily. 04/28/14   Isaiah Serge, NP  aspirin EC 81 MG EC tablet Take 1 tablet (81 mg total) by mouth daily. Patient not taking: Reported on 05/01/2016 04/28/14   Isaiah Serge, NP  atorvastatin (LIPITOR) 40 MG tablet Take 40 mg by mouth daily.    Historical Provider, MD  cetirizine (ZYRTEC) 10 MG tablet Take 10 mg by mouth daily.    Historical Provider, MD  colestipol (COLESTID) 1 G tablet Take 1 g by mouth 2 (two) times daily.    Historical Provider, MD  diphenoxylate-atropine (LOMOTIL) 2.5-0.025 MG per tablet Take 2 tablets by mouth 2 (two) times daily as needed for diarrhea or loose stools.    Historical Provider, MD  fluticasone (FLONASE) 50 MCG/ACT nasal spray Place 1 spray into both  nostrils daily.    Historical Provider, MD  hydrochlorothiazide (HYDRODIURIL) 25 MG tablet Take 1 tablet (25 mg total) by mouth every other day. 05/02/16   Doreatha Lew, MD  HYDROcodone-acetaminophen (NORCO/VICODIN) 5-325 MG tablet Take 1 tablet by mouth every 4 (four) hours as needed for moderate pain. 05/02/16   Doreatha Lew, MD  metoprolol (TOPROL-XL) 200 MG 24 hr tablet Take 200 mg by mouth at bedtime.    Historical Provider, MD  montelukast (SINGULAIR) 10 MG tablet Take 1 tablet (10 mg total) by mouth at bedtime. 05/02/16   Doreatha Lew, MD  nitroGLYCERIN (NITROSTAT) 0.4 MG SL tablet Place 1 tablet (0.4 mg total) under the tongue every 5 (five) minutes x 3 doses as needed for chest pain. 04/14/13   Rogelia Mire, NP  omeprazole (PRILOSEC) 40 MG capsule Take 40 mg by mouth daily.    Historical Provider, MD  traZODone (DESYREL) 100 MG tablet Take 100 mg by mouth at bedtime.     Historical Provider, MD    Family History Family History  Problem Relation Age of Onset  . CAD Father     died @ 46  . CAD Mother     died @ 59    Social History Social History  Substance Use Topics  . Smoking status: Former Smoker    Quit date: 05/05/1991  . Smokeless tobacco: Former Systems developer  . Alcohol use No  No drug use   Allergies   Amoxil [amoxicillin]; Augmentin [amoxicillin-pot clavulanate]; Ciprofloxacin; Demerol [meperidine]; Infliximab; Morphine and related; and Ace inhibitors   Review of Systems Review of Systems  Constitutional: Negative.   HENT: Negative.   Respiratory: Negative.   Cardiovascular: Negative.   Gastrointestinal: Positive for abdominal distention, abdominal pain and nausea.  Musculoskeletal: Negative.   Skin: Negative.   Allergic/Immunologic: Positive for immunocompromised state.  Neurological: Negative.   Psychiatric/Behavioral: Negative.   All other systems reviewed and are negative.    Physical Exam Updated Vital Signs BP 122/68 (BP  Location: Left Arm)   Pulse 63   Temp 98.7 F (37.1 C) (Oral)   Resp 18   Ht 5' 8.5" (1.74 m)   Wt 245 lb (111.1 kg)   SpO2 92%   BMI 36.71 kg/m   Physical Exam  Constitutional: He appears well-developed and well-nourished. No distress.  HENT:  Head: Normocephalic and atraumatic.  Mouth/Throat: Oropharynx is clear and moist.  Eyes: Conjunctivae are normal. Pupils are equal, round, and reactive to light.  Neck: Neck supple. No tracheal deviation present. No thyromegaly present.  Cardiovascular: Normal rate and regular rhythm.   No murmur heard. Pulmonary/Chest: Effort normal and breath sounds normal.  Port-A-Cath at left  anterior chest. Site not reddened warm or tender  Abdominal: Soft. He exhibits distension.  Multiple surgical scars diffusely tender. Hypoactive bowel sounds  Genitourinary: Penis normal.  Genitourinary Comments: Normal male genitalia  Musculoskeletal: Normal range of motion. He exhibits no edema or tenderness.  Neurological: He is alert. Coordination normal.  Skin: Skin is warm and dry. No rash noted.  Psychiatric: He has a normal mood and affect.  Nursing note and vitals reviewed.    ED Treatments / Results  Labs (all labs ordered are listed, but only abnormal results are displayed) Labs Reviewed  URINALYSIS, ROUTINE W REFLEX MICROSCOPIC - Abnormal; Notable for the following:       Result Value   Protein, ur 30 (*)    Squamous Epithelial / LPF 0-5 (*)    All other components within normal limits  LIPASE, BLOOD  COMPREHENSIVE METABOLIC PANEL  CBC    EKG  EKG Interpretation None      Results for orders placed or performed during the hospital encounter of 09/04/16  Lipase, blood  Result Value Ref Range   Lipase 42 11 - 51 U/L  Comprehensive metabolic panel  Result Value Ref Range   Sodium 135 135 - 145 mmol/L   Potassium 3.9 3.5 - 5.1 mmol/L   Chloride 95 (L) 101 - 111 mmol/L   CO2 28 22 - 32 mmol/L   Glucose, Bld 132 (H) 65 - 99 mg/dL    BUN 28 (H) 6 - 20 mg/dL   Creatinine, Ser 1.91 (H) 0.61 - 1.24 mg/dL   Calcium 9.6 8.9 - 10.3 mg/dL   Total Protein 7.2 6.5 - 8.1 g/dL   Albumin 3.9 3.5 - 5.0 g/dL   AST 30 15 - 41 U/L   ALT 35 17 - 63 U/L   Alkaline Phosphatase 54 38 - 126 U/L   Total Bilirubin 1.2 0.3 - 1.2 mg/dL   GFR calc non Af Amer 32 (L) >60 mL/min   GFR calc Af Amer 37 (L) >60 mL/min   Anion gap 12 5 - 15  CBC  Result Value Ref Range   WBC 8.2 4.0 - 10.5 K/uL   RBC 5.10 4.22 - 5.81 MIL/uL   Hemoglobin 14.4 13.0 - 17.0 g/dL   HCT 44.3 39.0 - 52.0 %   MCV 86.9 78.0 - 100.0 fL   MCH 28.2 26.0 - 34.0 pg   MCHC 32.5 30.0 - 36.0 g/dL   RDW 14.1 11.5 - 15.5 %   Platelets 138 (L) 150 - 400 K/uL  Urinalysis, Routine w reflex microscopic  Result Value Ref Range   Color, Urine YELLOW YELLOW   APPearance CLEAR CLEAR   Specific Gravity, Urine 1.016 1.005 - 1.030   pH 5.0 5.0 - 8.0   Glucose, UA NEGATIVE NEGATIVE mg/dL   Hgb urine dipstick NEGATIVE NEGATIVE   Bilirubin Urine NEGATIVE NEGATIVE   Ketones, ur NEGATIVE NEGATIVE mg/dL   Protein, ur 30 (A) NEGATIVE mg/dL   Nitrite NEGATIVE NEGATIVE   Leukocytes, UA NEGATIVE NEGATIVE   RBC / HPF 0-5 0 - 5 RBC/hpf   WBC, UA 0-5 0 - 5 WBC/hpf   Bacteria, UA NONE SEEN NONE SEEN   Squamous Epithelial / LPF 0-5 (A) NONE SEEN  ED ECG REPORT   Date: 09/04/2016  Rate: 70  Rhythm: normal sinus rhythm  QRS Axis: normal  Intervals: normal  ST/T Wave abnormalities: ST elevations inferiorly  Conduction Disutrbances:none  Narrative Interpretation:   Old EKG Reviewed: unchanged Unchanged from 05/01/16 I  have personally reviewed the EKG tracing and disagree with the computerized printout as noted.  EKG is not acute MI, it is identical to 05/01/2016 Ct Abdomen Pelvis Wo Contrast  Result Date: 09/04/2016 CLINICAL DATA:  Crohn disease with abdominal pain for 4 days. Abdominal distention and vomiting since yesterday. EXAM: CT ABDOMEN AND PELVIS WITHOUT CONTRAST TECHNIQUE:  Multidetector CT imaging of the abdomen and pelvis was performed following the standard protocol without IV contrast. COMPARISON:  None. FINDINGS: Lower chest: The visualized cardiac chambers are mildly enlarged. No pericardial effusion. Bibasilar dependent atelectasis is identified. There is no pneumothorax. Minimal scarring along the periphery of the right lower lobe. Hepatobiliary: Cholecystectomy with reservoir effect likely accounting for the mild intrahepatic ductal dilatation. No space-occupying mass of the unenhanced liver is noted. Pancreas: Normal Spleen: Normal in size without mass.  Small adjacent splenule. Adrenals/Urinary Tract: The adrenal glands are normal bilaterally. There are circumscribed simple and complex cysts noted off the kidneys bilaterally some of which are hyperdense and may represent hemorrhagic or proteinaceous cysts, example series 3, image 44 off the interpolar left kidney measuring 1.6 cm. Others such as a 4.5 cm right lower pole cyst appear to containing milk-of-calcium along the dependent aspect. No nephrolithiasis or hydronephrosis. Further characterization is not possible given lack of IV contrast. Stomach/Bowel: The stomach is mildly distended with fluid. There is mild-to-moderate dilatation of small intestine starting from the third portion of the duodenum through much of the jejunum with fluid-filled dilated small bowel loops measuring up to 2.9 cm in caliber. There appears be a transition point in the right lower quadrant for which a short segment stricture is not excluded contributing to early changes of small bowel obstruction. Beyond this, the small bowel appears decompressed leading up to an enterocolic anastomosis in the right lower quadrant. No significant mural or fold thickening to suggest acute inflammatory bowel or definite tethering to suggest an adhesion. Vascular/Lymphatic: Aortic and branch vessel atherosclerosis is identified at the origins of the celiac, SMA  and both renal arteries. An infrarenal aortic stent graft is identified extending into both common iliac arteries with additional separate right internal iliac artery stent noted secondary to a 2.2 cm aneurysm along the distal internal iliac artery. 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 No lymphadenopathy by CT size criteria is identified. Reproductive: Prostate is unremarkable. Other: No abdominal wall hernia or abnormality. No abdominopelvic ascites. Musculoskeletal: Atrophy of the right rectus muscle with overlying skin staples. Cannulated screw fixation of the right femoral neck with left hip arthroplasty. No acute nor suspicious osseous lesions. IMPRESSION: 1. Dilated to duodenal and jejunal loops up to 2.9 cm with transition point in the right lower quadrant possibly from a short segment stricture or remotely adhesion given history of Crohn disease and surgical changes seen in the right lower quadrant. No bowel perforation, ascites or free air. No evidence of acute inflammatory bowel. 2. Simple and complex bilateral renal cysts which could be further correlated with MRI on a nonemergent basis for better characterization. 3. Aortoiliac stent grafts as above. Right internal iliac stent traversing a 2.2 cm aneurysm. 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 4. Cholecystectomy. Electronically Signed   By: Ashley Royalty M.D.   On: 09/04/2016 21:55   Radiology No results found.  Procedures Procedures (including critical care time)  Medications Ordered in ED Medications  0.9 %  sodium chloride  infusion (not administered)  HYDROmorphone (DILAUDID) injection 1 mg (not administered)  metoCLOPramide (REGLAN) injection 5 mg (not administered)   Initial Impression / Assessment and Plan / ED Course  I have reviewed the triage vital signs and the nursing notes. 7:15 PM  pain became worse and patient had feeling of general malaise after treatment with intravenous hydromorphone.  At 10:10 PM he complained of severe abdominal pain. Intravenous fentanyl ordered.  At 10:30 PM he feels much more comfortable after treatment with intravenous fentanyl and intravenous hydration. Pertinent labs & imaging results that were available during my care of the patient were reviewed by me and considered in my medical decision making (see chart for details).     CT scan viewed by me and discussed with radiologist. I consulted Dr. Ninfa Linden, from general surgical service will see patient while in the hospital. He requests admission to medical service. I consulted Dr.Niu who will arrange for admission. Renal insufficiency is chronic. Plan bowel rest IV hydration and symptomatic control. I don't feel the patient requires NG tube presently as he has not vomited all at all Final Clinical Impressions(s) / ED Diagnoses  Diagnosis #1 small bowel obstruction #2 chronic renal insufficiency Final diagnoses:  None    New Prescriptions New Prescriptions   No medications on file     Orlie Dakin, MD 09/04/16 [place], MD 09/05/16 [phone]

## 2016-09-04 NOTE — ED Triage Notes (Signed)
He haS A POWER PORT  AND HE IS A DNR  THEY THINK HE MAY BE OBSTRUCTED

## 2016-09-04 NOTE — ED Notes (Signed)
IV nurse currently attempting to  Start IV and get labs.

## 2016-09-05 ENCOUNTER — Inpatient Hospital Stay (HOSPITAL_COMMUNITY): Payer: Medicare Other

## 2016-09-05 DIAGNOSIS — K50919 Crohn's disease, unspecified, with unspecified complications: Secondary | ICD-10-CM

## 2016-09-05 DIAGNOSIS — K219 Gastro-esophageal reflux disease without esophagitis: Secondary | ICD-10-CM

## 2016-09-05 DIAGNOSIS — R072 Precordial pain: Secondary | ICD-10-CM

## 2016-09-05 DIAGNOSIS — N183 Chronic kidney disease, stage 3 unspecified: Secondary | ICD-10-CM | POA: Diagnosis present

## 2016-09-05 DIAGNOSIS — K56609 Unspecified intestinal obstruction, unspecified as to partial versus complete obstruction: Secondary | ICD-10-CM

## 2016-09-05 DIAGNOSIS — I10 Essential (primary) hypertension: Secondary | ICD-10-CM

## 2016-09-05 LAB — CBC
HCT: 43.2 % (ref 39.0–52.0)
Hemoglobin: 14 g/dL (ref 13.0–17.0)
MCH: 28.1 pg (ref 26.0–34.0)
MCHC: 32.4 g/dL (ref 30.0–36.0)
MCV: 86.7 fL (ref 78.0–100.0)
PLATELETS: 126 10*3/uL — AB (ref 150–400)
RBC: 4.98 MIL/uL (ref 4.22–5.81)
RDW: 14.3 % (ref 11.5–15.5)
WBC: 5.8 10*3/uL (ref 4.0–10.5)

## 2016-09-05 LAB — ECHOCARDIOGRAM COMPLETE
Height: 68.5 in
Weight: 4077.63 oz

## 2016-09-05 LAB — GLUCOSE, CAPILLARY: Glucose-Capillary: 108 mg/dL — ABNORMAL HIGH (ref 65–99)

## 2016-09-05 LAB — TROPONIN I

## 2016-09-05 LAB — BASIC METABOLIC PANEL
Anion gap: 13 (ref 5–15)
BUN: 25 mg/dL — AB (ref 6–20)
CALCIUM: 9.2 mg/dL (ref 8.9–10.3)
CO2: 27 mmol/L (ref 22–32)
CREATININE: 1.59 mg/dL — AB (ref 0.61–1.24)
Chloride: 97 mmol/L — ABNORMAL LOW (ref 101–111)
GFR calc non Af Amer: 40 mL/min — ABNORMAL LOW (ref >60)
GFR, EST AFRICAN AMERICAN: 46 mL/min — AB (ref >60)
GLUCOSE: 121 mg/dL — AB (ref 65–99)
Potassium: 4.1 mmol/L (ref 3.5–5.1)
Sodium: 137 mmol/L (ref 135–145)

## 2016-09-05 LAB — LIPID PANEL
Cholesterol: 83 mg/dL (ref 0–200)
HDL: 30 mg/dL — ABNORMAL LOW (ref >40)
LDL CALC: 15 mg/dL (ref 0–99)
Total CHOL/HDL Ratio: 2.8 RATIO
Triglycerides: 192 mg/dL — ABNORMAL HIGH (ref <150)
VLDL: 38 mg/dL (ref 0–40)

## 2016-09-05 LAB — BRAIN NATRIURETIC PEPTIDE: B Natriuretic Peptide: 40.7 pg/mL (ref 0.0–100.0)

## 2016-09-05 LAB — PROTIME-INR
INR: 1.12
PROTHROMBIN TIME: 14.5 s (ref 11.4–15.2)

## 2016-09-05 LAB — APTT: aPTT: 34 seconds (ref 24–36)

## 2016-09-05 MED ORDER — KETOROLAC TROMETHAMINE 30 MG/ML IJ SOLN
30.0000 mg | Freq: Four times a day (QID) | INTRAMUSCULAR | Status: AC | PRN
  Administered 2016-09-05 – 2016-09-07 (×8): 30 mg via INTRAVENOUS
  Filled 2016-09-05 (×8): qty 1

## 2016-09-05 MED ORDER — DIATRIZOATE MEGLUMINE & SODIUM 66-10 % PO SOLN
90.0000 mL | Freq: Once | ORAL | Status: AC
  Administered 2016-09-05: 90 mL via NASOGASTRIC
  Filled 2016-09-05: qty 90

## 2016-09-05 MED ORDER — METOPROLOL TARTRATE 5 MG/5ML IV SOLN
5.0000 mg | Freq: Three times a day (TID) | INTRAVENOUS | Status: DC
  Administered 2016-09-05 – 2016-09-08 (×10): 5 mg via INTRAVENOUS
  Filled 2016-09-05 (×10): qty 5

## 2016-09-05 MED ORDER — SODIUM CHLORIDE 0.9% FLUSH
10.0000 mL | INTRAVENOUS | Status: DC | PRN
  Administered 2016-09-05 – 2016-09-10 (×4): 10 mL
  Filled 2016-09-05 (×4): qty 40

## 2016-09-05 NOTE — Progress Notes (Signed)
CC: , Pain nausea abdominal distention chest pain/shortness of breath. Subjective: Patient sedated he'll open his eyes look around but he didn't speak the entire time I was there. His NG was in place and clamped. No official reading 70 NG was not hooked up. There was fluid coming out the sump portion. I flush the tube with air, hooked it to suction, advance the tube a few inches, and got 500 mL of gastric fluid immediately. During this whole time the patient did not speak would not answer any of my questions. MAR shows and receiving Dilaudid and 200 mg of trazodone last evening. Objective: Vital signs in last 24 hours: Temp:  [98.7 F (37.1 C)-99 F (37.2 C)] 98.7 F (37.1 C) (05/05 0438) Pulse Rate:  [58-75] 73 (05/05 0438) Resp:  [14-18] 18 (05/05 0438) BP: (122-155)/(68-89) 155/83 (05/05 0438) SpO2:  [92 %-97 %] 93 % (05/05 0438) Weight:  [111.1 kg (245 lb)-115.6 kg (254 lb 13.6 oz)] 115.6 kg (254 lb 13.6 oz) (05/05 0003)    50 by mouth 400 IV Voided 1 Afebrile vital signs are stable  Creatinine improved  Intake/Output from previous day: 05/04 0701 - 05/05 0700 In: 411.7 [P.O.:50; I.V.:361.7] Out: -  Intake/Output this shift: No intake/output data recorded.  General appearance: He is awake but nonverbal. Resp: clear to auscultation bilaterally and Anterior exam. GI: Distended, no bowel sounds, multiple abdominal scars from prior surgeries.  Lab Results:   Recent Labs  09/04/16 1920 09/05/16 0445  WBC 8.2 5.8  HGB 14.4 14.0  HCT 44.3 43.2  PLT 138* 126*    BMET  Recent Labs  09/04/16 1920 09/05/16 0445  NA 135 137  K 3.9 4.1  CL 95* 97*  CO2 28 27  GLUCOSE 132* 121*  BUN 28* 25*  CREATININE 1.91* 1.59*  CALCIUM 9.6 9.2   PT/INR  Recent Labs  09/05/16 0039  LABPROT 14.5  INR 1.12     Recent Labs Lab 09/04/16 1920  AST 30  ALT 35  ALKPHOS 54  BILITOT 1.2  PROT 7.2  ALBUMIN 3.9     Lipase     Component Value Date/Time   LIPASE 42  09/04/2016 1920     Studies/Results: Ct Abdomen Pelvis Wo Contrast  Result Date: 09/04/2016 CLINICAL DATA:  Crohn disease with abdominal pain for 4 days. Abdominal distention and vomiting since yesterday. EXAM: CT ABDOMEN AND PELVIS WITHOUT CONTRAST TECHNIQUE: Multidetector CT imaging of the abdomen and pelvis was performed following the standard protocol without IV contrast. COMPARISON:  None. FINDINGS: Lower chest: The visualized cardiac chambers are mildly enlarged. No pericardial effusion. Bibasilar dependent atelectasis is identified. There is no pneumothorax. Minimal scarring along the periphery of the right lower lobe. Hepatobiliary: Cholecystectomy with reservoir effect likely accounting for the mild intrahepatic ductal dilatation. No space-occupying mass of the unenhanced liver is noted. Pancreas: Normal Spleen: Normal in size without mass.  Small adjacent splenule. Adrenals/Urinary Tract: The adrenal glands are normal bilaterally. There are circumscribed simple and complex cysts noted off the kidneys bilaterally some of which are hyperdense and may represent hemorrhagic or proteinaceous cysts, example series 3, image 44 off the interpolar left kidney measuring 1.6 cm. Others such as a 4.5 cm right lower pole cyst appear to containing milk-of-calcium along the dependent aspect. No nephrolithiasis or hydronephrosis. Further characterization is not possible given lack of IV contrast. Stomach/Bowel: The stomach is mildly distended with fluid. There is mild-to-moderate dilatation of small intestine starting from the third portion of the duodenum  through much of the jejunum with fluid-filled dilated small bowel loops measuring up to 2.9 cm in caliber. There appears be a transition point in the right lower quadrant for which a short segment stricture is not excluded contributing to early changes of small bowel obstruction. Beyond this, the small bowel appears decompressed leading up to an enterocolic  anastomosis in the right lower quadrant. No significant mural or fold thickening to suggest acute inflammatory bowel or definite tethering to suggest an adhesion. Vascular/Lymphatic: Aortic and branch vessel atherosclerosis is identified at the origins of the celiac, SMA and both renal arteries. An infrarenal aortic stent graft is identified extending into both common iliac arteries with additional separate right internal iliac artery stent noted secondary to a 2.2 cm aneurysm along the distal internal iliac artery. 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 No lymphadenopathy by CT size criteria is identified. Reproductive: Prostate is unremarkable. Other: No abdominal wall hernia or abnormality. No abdominopelvic ascites. Musculoskeletal: Atrophy of the right rectus muscle with overlying skin staples. Cannulated screw fixation of the right femoral neck with left hip arthroplasty. No acute nor suspicious osseous lesions. IMPRESSION: 1. Dilated to duodenal and jejunal loops up to 2.9 cm with transition point in the right lower quadrant possibly from a short segment stricture or remotely adhesion given history of Crohn disease and surgical changes seen in the right lower quadrant. No bowel perforation, ascites or free air. No evidence of acute inflammatory bowel. 2. Simple and complex bilateral renal cysts which could be further correlated with MRI on a nonemergent basis for better characterization. 3. Aortoiliac stent grafts as above. Right internal iliac stent traversing a 2.2 cm aneurysm. 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 4. Cholecystectomy. Electronically Signed   By: Ashley Royalty M.D.   On: 09/04/2016 21:55   Prior to Admission medications   Medication Sig Start Date End Date Taking? Authorizing Provider  albuterol (PROVENTIL HFA;VENTOLIN HFA) 108 (90 Base) MCG/ACT  inhaler Inhale 2 puffs into the lungs every 6 (six) hours as needed for wheezing or shortness of breath.   Yes [provider]  amLODipine (NORVASC) 10 MG tablet Take 1 tablet (10 mg total) by mouth daily. 04/28/14  Yes Isaiah Serge, NP  atorvastatin (LIPITOR) 80 MG tablet Take 80 mg by mouth at bedtime.   Yes [provider]  colestipol (COLESTID) 1 G tablet Take 1 g by mouth 2 (two) times daily.   Yes [provider]  cyclobenzaprine (FLEXERIL) 5 MG tablet Take 5 mg by mouth 3 (three) times daily as needed for muscle spasms.   Yes [provider]  diphenoxylate-atropine (LOMOTIL) 2.5-0.025 MG per tablet Take 1 tablet by mouth 2 (two) times daily.    Yes [provider]  hydrochlorothiazide (HYDRODIURIL) 25 MG tablet Take 1 tablet (25 mg total) by mouth every other day. Patient taking differently: Take 12.5 mg by mouth daily.  05/02/16  Yes Patrecia Pour, Christean Grief, MD  HYDROcodone-acetaminophen (NORCO/VICODIN) 5-325 MG tablet Take 1 tablet by mouth every 4 (four) hours as needed for moderate pain. 05/02/16  Yes Patrecia Pour, Christean Grief, MD  metoprolol (TOPROL-XL) 200 MG 24 hr tablet Take 200 mg by mouth at bedtime.   Yes [provider]  montelukast (SINGULAIR) 10 MG tablet Take 1 tablet (10 mg total) by mouth at bedtime. 05/02/16  Yes Doreatha Lew, MD  nitroGLYCERIN Delilah Shan)  0.4 MG SL tablet Place 1 tablet (0.4 mg total) under the tongue every 5 (five) minutes x 3 doses as needed for chest pain. 04/14/13  Yes Rogelia Mire, NP  traZODone (DESYREL) 100 MG tablet Take 200 mg by mouth at bedtime.    Yes [provider]  aspirin EC 81 MG EC tablet Take 1 tablet (81 mg total) by mouth daily. Patient not taking: Reported on 05/01/2016 04/28/14   Isaiah Serge, NP    Medications: . amLODipine  10 mg Oral Daily  . aspirin EC  81 mg Oral Daily  . atorvastatin  80 mg Oral QHS  . colestipol  1 g Oral BID  . diatrizoate  meglumine-sodium  90 mL Per NG tube Once  . iopamidol      . metoprolol  200 mg Oral QHS  . montelukast  10 mg Oral QHS  . sodium chloride flush  3 mL Intravenous Q12H  . traZODone  200 mg Oral QHS   . sodium chloride 100 mL/hr at 09/05/16 0012  . famotidine (PEPCID) IV Stopped (09/05/16 0202)   Anti-infectives    None      Assessment/Plan Small bowel obstruction Crohn's disease with multiple surgeries. Status post abdominal aortic aneurysm stenting 2012. History of bladder cancer  126K this AM Thrombocytopenia  History of chronic kidney disease stage III Right middle lung nodule Morbid obesity Body mass index is 38. CAD/history of SVT History of TIA Dyslipidemia DNR FEN:  IV fluids/nothing by mouth ID:  None DVT:  SCDs   Plan: I started the suction for his NG decompression. The official read is still pending. I would aim to give the Gastrografin around noon and give him plenty of time to decompress. Will defer AMS evaluation to Dr. Grandville Silos. I would discontinue the by mouth metoprolol and put him on something IV during the interim. I'm also going to place him on telemetry. I'm going to make him completely nothing by mouth for now no meds just ice chips.    LOS: 1 day    Verenis Nicosia 09/05/2016 [phone]

## 2016-09-05 NOTE — Progress Notes (Signed)
PROGRESS NOTE    Sargon Scouten  ZOX:[id number] DOB: 1935/07/24 DOA: 09/04/2016 PCP: [name], MD    Brief Narrative Bill Dunn is a 81 y.o. male with medical history significant of Crohn's disease, hypertension, hyperlipidemia, TIA, SVT, obesity, chronic kidney disease-stage III, bladder cancer, AAA (s/p of repair 2012), multiple SBO, who presents with abdominal pain, nausea, abdominal distention, chest pain and SOB.  Patient states that he has been having intermittent abdominal pain for 4 days, which has worsened today. He also has abdominal distention, nausea, but no vomiting. His last bowel movement was this afternoon. He is not passing any gas.  Patient also reports chest pain and shortness of breath. His chest pain is located in the bilateral upper chest, constant, 10 out of 10 in severity earlier, which has resolved when I saw pt in ED. Patient has chronic cough with small amount of yellow colored sputum production. No runny nose or sore throat. No fever or chills. Patient denies symptoms of UTI or unilateral weakness. Pt had oxygen desaturation to 87% on room air in ED, which improved to 99% on 2 L nasal cannula oxygen    Assessment & Plan:   Principal Problem:   SBO (small bowel obstruction) (HCC) Active Problems:   Crohn's disease ([place])   HTN (hypertension)   Hypertriglyceridemia   History of TIA (transient ischemic attack)   Chest pain   Gastroesophageal reflux disease without esophagitis   Acute on chronic respiratory failure with hypoxia (HCC)   CKD (chronic kidney disease), stage III   Small bowel obstruction (HCC)  #1 small bowel obstruction Questionable etiology. Patient with Crohn's disease and multiple abdominal surgeries. NG tube has been placed and now on suction. Concern of small bowel obstruction versus partial bowel obstruction versus ileus. Patient currently tolerating nothing by mouth. IV fluids. Supportive care. Continue nothing by mouth, IV  fluids, pain management, supportive care. General surgery following.  #2 Crohn's disease Continue Crohn's. Stable.  #3 hypertension Stable. Monitor for now.   #4 Gastroesophageal reflux disease PPI.  #5 history of TIA Continue aspirin, Plavix for secondary stroke prophylaxis.  #6 chronic kidney disease stage III Stable.   DVT prophylaxis: SCDs Code Status: DO NOT RESUSCITATE Family Communication: Updated patient. No family at bedside. Disposition Plan: Home vs SNF was small bowel obstruction is resolved and patient tolerating oral intake and per general surgery.   Consultants:   Gen. surgery: Dr. Ninfa Linden 09/05/2016  Procedures:   CT abdomen and pelvis 09/04/2016  Chest x-ray 09/05/2016  Abdominal films 09/05/2016  2-D echo 09/05/2016  Antimicrobials:   None   Subjective: Patient sitting up in chair. Patient hard of hearing. Patient with no battery needs hearing aid. Patient unable to hear my questions. Patient however denies any chest pain or shortness of breath.  Objective: Vitals:   09/04/16 2315 09/05/16 0003 09/05/16 0438 09/05/16 1500  BP: (!) 144/77 136/76 (!) 155/83 (!) 120/50  Pulse: 69 70 73 72  Resp: 14 16 18 16   Temp:  99 F (37.2 C) 98.7 F (37.1 C) 99.1 F (37.3 C)  TempSrc:  Oral Oral Oral  SpO2: 95% 95% 93% (!) 89%  Weight:  115.6 kg (254 lb 13.6 oz)    Height:  5' 8.5" (1.74 m)      Intake/Output Summary (Last 24 hours) at 09/05/16 1957 Last data filed at 09/05/16 1900  Gross per 24 hour  Intake             1840 ml  Output  1300 ml  Net              540 ml   Filed Weights   09/04/16 1746 09/05/16 0003  Weight: 111.1 kg (245 lb) 115.6 kg (254 lb 13.6 oz)    Examination:  General exam: Appears calm and comfortable.HOH. NGT in place. Respiratory system: Clear to auscultation anterior lung fields. Respiratory effort normal. Cardiovascular system: S1 & S2 heard, RRR. No JVD, murmurs, rubs, gallops or clicks. No  pedal edema. Gastrointestinal system: Abdomen is distended, hypoactive bowel sounds. Multiple abdominal scars. No organomegaly or masses felt.  Central nervous system: Alert and oriented. No focal neurological deficits. Extremities: Symmetric 5 x 5 power. Skin: No rashes, lesions or ulcers Psychiatry: Judgement and insight appear normal. Mood & affect appropriate.     Data Reviewed: I have personally reviewed following labs and imaging studies  CBC:  Recent Labs Lab 09/04/16 1920 09/05/16 0445  WBC 8.2 5.8  HGB 14.4 14.0  HCT 44.3 43.2  MCV 86.9 86.7  PLT 138* 540*   Basic Metabolic Panel:  Recent Labs Lab 09/04/16 1920 09/05/16 0445  NA 135 137  K 3.9 4.1  CL 95* 97*  CO2 28 27  GLUCOSE 132* 121*  BUN 28* 25*  CREATININE 1.91* 1.59*  CALCIUM 9.6 9.2   GFR: Estimated Creatinine Clearance: 47.7 mL/min (A) (by C-G formula based on SCr of 1.59 mg/dL (H)). Liver Function Tests:  Recent Labs Lab 09/04/16 1920  AST 30  ALT 35  ALKPHOS 54  BILITOT 1.2  PROT 7.2  ALBUMIN 3.9    Recent Labs Lab 09/04/16 1920  LIPASE 42   No results for input(s): AMMONIA in the last 168 hours. Coagulation Profile:  Recent Labs Lab 09/05/16 0039  INR 1.12   Cardiac Enzymes:  Recent Labs Lab 09/05/16 0039 09/05/16 0445 09/05/16 1054  TROPONINI <0.03 <0.03 <0.03   BNP (last 3 results) No results for input(s): PROBNP in the last 8760 hours. HbA1C: No results for input(s): HGBA1C in the last 72 hours. CBG:  Recent Labs Lab 09/05/16 0831  GLUCAP 108*   Lipid Profile:  Recent Labs  09/05/16 0445  CHOL 83  HDL 30*  LDLCALC 15  TRIG 192*  CHOLHDL 2.8   Thyroid Function Tests: No results for input(s): TSH, T4TOTAL, FREET4, T3FREE, THYROIDAB in the last 72 hours. Anemia Panel: No results for input(s): VITAMINB12, FOLATE, FERRITIN, TIBC, IRON, RETICCTPCT in the last 72 hours. Sepsis Labs: No results for input(s): PROCALCITON, LATICACIDVEN in the last 168  hours.  No results found for this or any previous visit (from the past 240 hour(s)).       Radiology Studies: Ct Abdomen Pelvis Wo Contrast  Result Date: 09/04/2016 CLINICAL DATA:  Crohn disease with abdominal pain for 4 days. Abdominal distention and vomiting since yesterday. EXAM: CT ABDOMEN AND PELVIS WITHOUT CONTRAST TECHNIQUE: Multidetector CT imaging of the abdomen and pelvis was performed following the standard protocol without IV contrast. COMPARISON:  None. FINDINGS: Lower chest: The visualized cardiac chambers are mildly enlarged. No pericardial effusion. Bibasilar dependent atelectasis is identified. There is no pneumothorax. Minimal scarring along the periphery of the right lower lobe. Hepatobiliary: Cholecystectomy with reservoir effect likely accounting for the mild intrahepatic ductal dilatation. No space-occupying mass of the unenhanced liver is noted. Pancreas: Normal Spleen: Normal in size without mass.  Small adjacent splenule. Adrenals/Urinary Tract: The adrenal glands are normal bilaterally. There are circumscribed simple and complex cysts noted off the kidneys bilaterally some of which  are hyperdense and may represent hemorrhagic or proteinaceous cysts, example series 3, image 44 off the interpolar left kidney measuring 1.6 cm. Others such as a 4.5 cm right lower pole cyst appear to containing milk-of-calcium along the dependent aspect. No nephrolithiasis or hydronephrosis. Further characterization is not possible given lack of IV contrast. Stomach/Bowel: The stomach is mildly distended with fluid. There is mild-to-moderate dilatation of small intestine starting from the third portion of the duodenum through much of the jejunum with fluid-filled dilated small bowel loops measuring up to 2.9 cm in caliber. There appears be a transition point in the right lower quadrant for which a short segment stricture is not excluded contributing to early changes of small bowel obstruction. Beyond  this, the small bowel appears decompressed leading up to an enterocolic anastomosis in the right lower quadrant. No significant mural or fold thickening to suggest acute inflammatory bowel or definite tethering to suggest an adhesion. Vascular/Lymphatic: Aortic and branch vessel atherosclerosis is identified at the origins of the celiac, SMA and both renal arteries. An infrarenal aortic stent graft is identified extending into both common iliac arteries with additional separate right internal iliac artery stent noted secondary to a 2.2 cm aneurysm along the distal internal iliac artery. 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 No lymphadenopathy by CT size criteria is identified. Reproductive: Prostate is unremarkable. Other: No abdominal wall hernia or abnormality. No abdominopelvic ascites. Musculoskeletal: Atrophy of the right rectus muscle with overlying skin staples. Cannulated screw fixation of the right femoral neck with left hip arthroplasty. No acute nor suspicious osseous lesions. IMPRESSION: 1. Dilated to duodenal and jejunal loops up to 2.9 cm with transition point in the right lower quadrant possibly from a short segment stricture or remotely adhesion given history of Crohn disease and surgical changes seen in the right lower quadrant. No bowel perforation, ascites or free air. No evidence of acute inflammatory bowel. 2. Simple and complex bilateral renal cysts which could be further correlated with MRI on a nonemergent basis for better characterization. 3. Aortoiliac stent grafts as above. Right internal iliac stent traversing a 2.2 cm aneurysm. 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 4. Cholecystectomy. Electronically Signed   By: Ashley Royalty M.D.   On: 09/04/2016 21:55   Dg Chest 2 View  Result Date: 09/05/2016 CLINICAL DATA:  Mid chest pain. EXAM: CHEST  2  VIEW COMPARISON:  05/01/2016 FINDINGS: The heart is mildly enlarged. The aorta shows ectasia. Port-A-Cath shows stable appearance with the tip in the distal SVC. A nasogastric tube is present with the tip extending into the stomach. There is no evidence of pulmonary edema, consolidation, pneumothorax, nodule or pleural fluid. IMPRESSION: Cardiac enlargement.  No active cardiopulmonary disease. Electronically Signed   By: Aletta Edouard M.D.   On: 09/05/2016 09:41   Dg Abd 1 View  Result Date: 09/05/2016 CLINICAL DATA:  Nausea and abdominal distention. Status post nasogastric tube placement. EXAM: ABDOMEN - 1 VIEW COMPARISON:  CT of the abdomen and pelvis on 09/04/2016. FINDINGS: The nasogastric tube extends into the proximal stomach. There is some degree of distention of the stomach with air. Visualized proximal small bowel loops show dilatation with maximal diameter of approximately 5.7 cm. Findings are suggestive of partial small bowel obstruction versus small bowel ileus. Evidence of prior abdominal wall hernia repair. The upper portion of an aortic endograft is visualized. IMPRESSION: Nasogastric  tube extends into the stomach. The stomach is distended with air and there is evidence of dilatation of proximal small bowel consistent with partial obstruction versus ileus. Electronically Signed   By: Aletta Edouard M.D.   On: 09/05/2016 09:46        Scheduled Meds: . metoprolol  5 mg Intravenous Q8H  . sodium chloride flush  3 mL Intravenous Q12H   Continuous Infusions: . sodium chloride 100 mL/hr at 09/05/16 1517  . famotidine (PEPCID) IV Stopped (09/05/16 1001)     LOS: 1 day    Time spent: 46 minutes    Maikayla Beggs, MD [facility] Pager [phone]  If 7PM-7AM, please contact night-coverage [email or web address] Password [facility] 09/05/2016, 7:57 PM

## 2016-09-05 NOTE — Consult Note (Signed)
Reason for Consult:SBO Referring Physician: Dr. Mora Bellman  Bill Dunn is an 81 y.o. male.  HPI: This is a 81 year old gentleman with multiple chronic medical problems including a history of Crohn's disease who is status post multiple abdominal surgeries performed and was constant. He now presents with a four-day history of worsening abdominal pain with distention and nausea but no emesis. He has chronic loose bowel movements. 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 I believe he just moved to the area 2014. A CT scan of the abdomen and pelvis was performed last evening showing dilated small bowel from the distal duodenum to the jejunum with a possible transition zone. He reports that he has had at least 10-12 surgical procedures on his abdomen which includes bowel resections and lysis of adhesions. He was told he was not an operative candidate for any further bowel surgery.  Past Medical History:  Diagnosis Date  . AAA (abdominal aortic aneurysm) ([place])    a. s/p stent grafting in 2012, in [place], [place], per pt.  . Arthritis   . Bladder cancer ([place])   . Chronic bronchitis ([place])   . CKD (chronic kidney disease), stage III   . Crohn disease ([place])    a. s/p multiple bowel resections;  b. s/p portacath.  . Disability of walking    a. no feeling in right leg since surgical complication ~ 6378 - predominantly uses w/c.  Marland Kitchen Dysphagia    a. 03/2016 EGD: nl EGD. Esoph dil performed.  Marland Kitchen HTN (hypertension)   . Hypertriglyceridemia   . Lung nodule    a. RML -->stable dating back to 02/2014.  . Morbid obesity ([place])   . Pericarditis 04/13/2013  . Pleuritic chest pain    a. s/p reportedly nl cath in 2012, [place], [place];  b. 04/2013 Cath: LM nl, LAD 71m D1 nl, LCX nl, OM1 20, OM2 sm 442mRCA 40p/m, 20d, EF 55-65%;  c. 04/2013 Echo: EF 55%, no rwma;  d. chest pain improved with colchicine.  . [place]is [place]  . [place]) [place]   a.  04/2013->bb therapy.  . Marland KitchenIA (transient ischemic attack)    a. x 2, last ~ 2002    Past Surgical History:  Procedure Laterality Date  . ABDOMINAL AORTIC ANEURYSM REPAIR     a. reported stenting 2012  . APPENDECTOMY    . CHOLECYSTECTOMY    . COCHLEAR IMPLANT    . COLON SURGERY     a. multiple bowel resections 2/2 chrohn's dzs.  . INGUINAL HERNIA REPAIR    . LEFT HEART CATHETERIZATION WITH CORONARY ANGIOGRAM Bilateral 04/13/2013   Procedure: LEFT HEART CATHETERIZATION WITH CORONARY ANGIOGRAM;  Surgeon: Peter M JoMartiniqueMD;  [facility];  Service: Cardiovascular;  Laterality: Bilateral;  . Left Shoulder Surgery    . RENAL CYST EXCISION    . spinal fusion      Family History  Problem Relation Age of Onset  . CAD Father     died @ 7530. CAD Mother     died @ 6669  Social History:  reports that he quit smoking about 25 years ago. He has quit using smokeless tobacco. He reports that he does not drink alcohol or use drugs.  Allergies:  Allergies  Allergen Reactions  . Amoxil [Amoxicillin] Other (See Comments)    REACTION: C-diff  . Augmentin [Amoxicillin-Pot Clavulanate] Other (See Comments)    REACTION: C-Diff  .  Dilaudid [Hydromorphone Hcl] Shortness Of Breath and Other (See Comments)    Stomach pain - reaction to injection of dilaudid and reglan together  . Reglan [Metoclopramide] Shortness Of Breath and Other (See Comments)    Stomach pain - reaction to injection of dilaudid and reglan together  . Ciprofloxacin Diarrhea and Other (See Comments)    Caused C-diff  . Demerol [Meperidine] Nausea And Vomiting  . Infliximab Nausea Only    Reaction to Remicade/ flu like symptoms  . Morphine And Related Nausea And Vomiting  . Sulfamethoxazole-Trimethoprim Other (See Comments)    Caused renal failure in combination with Prednisone  . Vitamin D Analogs Rash  . Levaquin [Levofloxacin] Other (See Comments)    Joint pain  . Ace Inhibitors Cough    Reaction to ramipril     Medications: I have reviewed the patient's current medications.  Results for orders placed or performed during the hospital encounter of 09/04/16 (from the past 48 hour(s))  Urinalysis, Routine w reflex microscopic     Status: Abnormal   Collection Time: 09/04/16  5:49 PM  Result Value Ref Range   Color, Urine YELLOW YELLOW   APPearance CLEAR CLEAR   Specific Gravity, Urine 1.016 1.005 - 1.030   pH 5.0 5.0 - 8.0   Glucose, UA NEGATIVE NEGATIVE mg/dL   Hgb urine dipstick NEGATIVE NEGATIVE   Bilirubin Urine NEGATIVE NEGATIVE   Ketones, ur NEGATIVE NEGATIVE mg/dL   Protein, ur 30 (A) NEGATIVE mg/dL   Nitrite NEGATIVE NEGATIVE   Leukocytes, UA NEGATIVE NEGATIVE   RBC / HPF 0-5 0 - 5 RBC/hpf   WBC, UA 0-5 0 - 5 WBC/hpf   Bacteria, UA NONE SEEN NONE SEEN   Squamous Epithelial / LPF 0-5 (A) NONE SEEN  Lipase, blood     Status: None   Collection Time: 09/04/16  7:20 PM  Result Value Ref Range   Lipase 42 11 - 51 U/L  Comprehensive metabolic panel     Status: Abnormal   Collection Time: 09/04/16  7:20 PM  Result Value Ref Range   Sodium 135 135 - 145 mmol/L   Potassium 3.9 3.5 - 5.1 mmol/L   Chloride 95 (L) 101 - 111 mmol/L   CO2 28 22 - 32 mmol/L   Glucose, Bld 132 (H) 65 - 99 mg/dL   BUN 28 (H) 6 - 20 mg/dL   Creatinine, Ser 1.91 (H) 0.61 - 1.24 mg/dL   Calcium 9.6 8.9 - 10.3 mg/dL   Total Protein 7.2 6.5 - 8.1 g/dL   Albumin 3.9 3.5 - 5.0 g/dL   AST 30 15 - 41 U/L   ALT 35 17 - 63 U/L   Alkaline Phosphatase 54 38 - 126 U/L   Total Bilirubin 1.2 0.3 - 1.2 mg/dL   GFR calc non Af Amer 32 (L) >60 mL/min   GFR calc Af Amer 37 (L) >60 mL/min    Comment: (NOTE) The eGFR has been calculated using the CKD EPI equation. This calculation has not been validated in all clinical situations. eGFR's persistently <60 mL/min signify possible Chronic Kidney Disease.    Anion gap 12 5 - 15  CBC     Status: Abnormal   Collection Time: 09/04/16  7:20 PM  Result Value Ref Range   WBC  8.2 4.0 - 10.5 K/uL   RBC 5.10 4.22 - 5.81 MIL/uL   Hemoglobin 14.4 13.0 - 17.0 g/dL   HCT 44.3 39.0 - 52.0 %   MCV 86.9 78.0 - 100.0  fL   MCH 28.2 26.0 - 34.0 pg   MCHC 32.5 30.0 - 36.0 g/dL   RDW 14.1 11.5 - 15.5 %   Platelets 138 (L) 150 - 400 K/uL  Brain natriuretic peptide     Status: None   Collection Time: 09/05/16 12:39 AM  Result Value Ref Range   B Natriuretic Peptide 40.7 0.0 - 100.0 pg/mL  Troponin I (q 6hr x 3)     Status: None   Collection Time: 09/05/16 12:39 AM  Result Value Ref Range   Troponin I <0.03 <0.03 ng/mL  Protime-INR     Status: None   Collection Time: 09/05/16 12:39 AM  Result Value Ref Range   Prothrombin Time 14.5 11.4 - 15.2 seconds   INR 1.12   APTT     Status: None   Collection Time: 09/05/16 12:39 AM  Result Value Ref Range   aPTT 34 24 - 36 seconds  CBC     Status: Abnormal   Collection Time: 09/05/16  4:45 AM  Result Value Ref Range   WBC 5.8 4.0 - 10.5 K/uL   RBC 4.98 4.22 - 5.81 MIL/uL   Hemoglobin 14.0 13.0 - 17.0 g/dL   HCT 43.2 39.0 - 52.0 %   MCV 86.7 78.0 - 100.0 fL   MCH 28.1 26.0 - 34.0 pg   MCHC 32.4 30.0 - 36.0 g/dL   RDW 14.3 11.5 - 15.5 %   Platelets 126 (L) 150 - 400 K/uL    Ct Abdomen Pelvis Wo Contrast  Result Date: 09/04/2016 CLINICAL DATA:  Crohn disease with abdominal pain for 4 days. Abdominal distention and vomiting since yesterday. EXAM: CT ABDOMEN AND PELVIS WITHOUT CONTRAST TECHNIQUE: Multidetector CT imaging of the abdomen and pelvis was performed following the standard protocol without IV contrast. COMPARISON:  None. FINDINGS: Lower chest: The visualized cardiac chambers are mildly enlarged. No pericardial effusion. Bibasilar dependent atelectasis is identified. There is no pneumothorax. Minimal scarring along the periphery of the right lower lobe. Hepatobiliary: Cholecystectomy with reservoir effect likely accounting for the mild intrahepatic ductal dilatation. No space-occupying mass of the unenhanced liver is  noted. Pancreas: Normal Spleen: Normal in size without mass.  Small adjacent splenule. Adrenals/Urinary Tract: The adrenal glands are normal bilaterally. There are circumscribed simple and complex cysts noted off the kidneys bilaterally some of which are hyperdense and may represent hemorrhagic or proteinaceous cysts, example series 3, image 44 off the interpolar left kidney measuring 1.6 cm. Others such as a 4.5 cm right lower pole cyst appear to containing milk-of-calcium along the dependent aspect. No nephrolithiasis or hydronephrosis. Further characterization is not possible given lack of IV contrast. Stomach/Bowel: The stomach is mildly distended with fluid. There is mild-to-moderate dilatation of small intestine starting from the third portion of the duodenum through much of the jejunum with fluid-filled dilated small bowel loops measuring up to 2.9 cm in caliber. There appears be a transition point in the right lower quadrant for which a short segment stricture is not excluded contributing to early changes of small bowel obstruction. Beyond this, the small bowel appears decompressed leading up to an enterocolic anastomosis in the right lower quadrant. No significant mural or fold thickening to suggest acute inflammatory bowel or definite tethering to suggest an adhesion. Vascular/Lymphatic: Aortic and branch vessel atherosclerosis is identified at the origins of the celiac, SMA and both renal arteries. An infrarenal aortic stent graft is identified extending into both common iliac arteries with additional separate right internal iliac artery stent  noted secondary to a 2.2 cm aneurysm along the distal internal iliac artery. 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 No lymphadenopathy by CT size criteria is identified. Reproductive: Prostate is unremarkable. Other: No abdominal wall hernia or abnormality. No abdominopelvic ascites.  Musculoskeletal: Atrophy of the right rectus muscle with overlying skin staples. Cannulated screw fixation of the right femoral neck with left hip arthroplasty. No acute nor suspicious osseous lesions. IMPRESSION: 1. Dilated to duodenal and jejunal loops up to 2.9 cm with transition point in the right lower quadrant possibly from a short segment stricture or remotely adhesion given history of Crohn disease and surgical changes seen in the right lower quadrant. No bowel perforation, ascites or free air. No evidence of acute inflammatory bowel. 2. Simple and complex bilateral renal cysts which could be further correlated with MRI on a nonemergent basis for better characterization. 3. Aortoiliac stent grafts as above. Right internal iliac stent traversing a 2.2 cm aneurysm. 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 4. Cholecystectomy. Electronically Signed   By: Ashley Royalty M.D.   On: 09/04/2016 21:55    ROS Blood pressure (!) 155/83, pulse 73, temperature 98.7 F (37.1 C), temperature source Oral, resp. rate 18, height 5' 8.5" (1.74 m), weight 115.6 kg (254 lb 13.6 oz), SpO2 93 %. Physical Exam  Constitutional: He appears well-developed and well-nourished. No distress.  HENT:  Head: Normocephalic and atraumatic.  Right Ear: External ear normal.  Left Ear: External ear normal.  Nose: Nose normal.  Mouth/Throat: No oropharyngeal exudate.  Eyes: Pupils are equal, round, and reactive to light. Right eye exhibits no discharge. Left eye exhibits no discharge. No scleral icterus.  Neck: Normal range of motion. No tracheal deviation present.  Cardiovascular: Normal rate, regular rhythm and normal heart sounds.   No murmur heard. Respiratory: Effort normal and breath sounds normal. No respiratory distress. He has no wheezes.  GI: He exhibits distension.  He is obese. There is some distention and mild tenderness. There is a well-healed abdominal  incision  Musculoskeletal: Normal range of motion. He exhibits no tenderness or deformity.  Lymphadenopathy:    He has no cervical adenopathy.  Neurological:  He has been up all night and has just received medication so I am having some difficulty keeping him awake for conversation  Skin: Skin is warm and dry. He is not diaphoretic. No erythema.    Assessment/Plan: Small bowel obstruction  Currently, he has had no emesis so a nasogastric tube is on hold. Per his report, he may be a poor operative candidate with high risk for bowel injury. I will order the small bowel protocol to see if this will help determine whether or not he does need a nasogastric tube and continued conservative management. We will follow him with you.  Winna Golla A 09/05/2016, 6:20 AM

## 2016-09-06 LAB — CBC WITH DIFFERENTIAL/PLATELET
BASOS PCT: 1 %
Basophils Absolute: 0 10*3/uL (ref 0.0–0.1)
EOS ABS: 0.1 10*3/uL (ref 0.0–0.7)
Eosinophils Relative: 2 %
HEMATOCRIT: 38.7 % — AB (ref 39.0–52.0)
HEMOGLOBIN: 12.3 g/dL — AB (ref 13.0–17.0)
LYMPHS ABS: 1.6 10*3/uL (ref 0.7–4.0)
Lymphocytes Relative: 34 %
MCH: 27.8 pg (ref 26.0–34.0)
MCHC: 31.8 g/dL (ref 30.0–36.0)
MCV: 87.4 fL (ref 78.0–100.0)
Monocytes Absolute: 0.5 10*3/uL (ref 0.1–1.0)
Monocytes Relative: 11 %
NEUTROS ABS: 2.5 10*3/uL (ref 1.7–7.7)
NEUTROS PCT: 52 %
Platelets: 113 10*3/uL — ABNORMAL LOW (ref 150–400)
RBC: 4.43 MIL/uL (ref 4.22–5.81)
RDW: 14.3 % (ref 11.5–15.5)
WBC: 4.8 10*3/uL (ref 4.0–10.5)

## 2016-09-06 LAB — BASIC METABOLIC PANEL
Anion gap: 9 (ref 5–15)
BUN: 27 mg/dL — AB (ref 6–20)
CO2: 27 mmol/L (ref 22–32)
CREATININE: 1.93 mg/dL — AB (ref 0.61–1.24)
Calcium: 8.5 mg/dL — ABNORMAL LOW (ref 8.9–10.3)
Chloride: 104 mmol/L (ref 101–111)
GFR calc Af Amer: 37 mL/min — ABNORMAL LOW (ref >60)
GFR, EST NON AFRICAN AMERICAN: 32 mL/min — AB (ref >60)
Glucose, Bld: 109 mg/dL — ABNORMAL HIGH (ref 65–99)
POTASSIUM: 3.9 mmol/L (ref 3.5–5.1)
SODIUM: 140 mmol/L (ref 135–145)

## 2016-09-06 LAB — GLUCOSE, CAPILLARY: GLUCOSE-CAPILLARY: 93 mg/dL (ref 65–99)

## 2016-09-06 LAB — HEMOGLOBIN A1C
HEMOGLOBIN A1C: 5.6 % (ref 4.8–5.6)
MEAN PLASMA GLUCOSE: 114 mg/dL

## 2016-09-06 MED ORDER — PHENOL 1.4 % MT LIQD
1.0000 | OROMUCOSAL | Status: DC | PRN
  Administered 2016-09-06: 1 via OROMUCOSAL
  Filled 2016-09-06: qty 177

## 2016-09-06 NOTE — Evaluation (Signed)
Physical Therapy Evaluation Patient Details Name: Bill Dunn MRN: [id number] DOB: 1935-07-06 Today's Date: 09/06/2016   History of Present Illness  Patient is a 81 yo male admitted 09/04/16 with abdominal pain, nausea, chest pain, SOB, confusion.  Patient with partial SBO.    PMH:  LE weakness since 2007, Crohn's disease, HTN, HLD, TIA, XVT, obesity, CKD, mult SBO  Clinical Impression  Patient presents with problems listed below. Patient will benefit from acute PT to maximize functional mobility prior to discharge.  Per patient, family provides 24/7 assist for mobility/ADL's.  Patient receives OP PT at the [place].  Recommend patient continue with OP PT.      Follow Up Recommendations Outpatient PT;Supervision for mobility/OOB (Patient reports he has OP PT at the [facility])    Equipment Recommendations  Other (comment) (Possibly lift equipment - will continue to assess)    Recommendations for Other Services OT consult     Precautions / Restrictions Precautions Precautions: Fall Restrictions Weight Bearing Restrictions: No      Mobility  Bed Mobility Overal bed mobility: Needs Assistance Bed Mobility: Rolling Rolling: Mod assist         General bed mobility comments: Mod assist to initiate rolling.  Patient declined further mobility due to fatigue - up in chair earlier.  Transfers                 General transfer comment: Declined  Ambulation/Gait                Stairs            Wheelchair Mobility    Modified Rankin (Stroke Patients Only)       Balance                                             Pertinent Vitals/Pain Pain Assessment: No/denies pain    Home Living Family/patient expects to be discharged to:: Private residence Living Arrangements: Spouse/significant other;Children;Other relatives (Wife, 2 dtrs, 1 son-in-law, 2 grandchildren) Available Help at Discharge: Family;Available 24 hours/day Type of Home: House        Home Layout: Able to live on main level with bedroom/bathroom Home Equipment: Wheelchair - power;Walker - 2 wheels;Shower seat;Cane - single point;Wheelchair - manual (Teacher, English as a foreign language)      Prior Function Level of Independence: Needs assistance   Gait / Transfers Assistance Needed: Patient uses power w/c majority of the time.  Requires assist to transfer.  Reports he can walk very short distance in house.  Assist to move supine to sit, and for sitting balance.  ADL's / Homemaking Assistance Needed: Assist for bathing and dressing, meals.        Hand Dominance        Extremity/Trunk Assessment   Upper Extremity Assessment Upper Extremity Assessment: Generalized weakness    Lower Extremity Assessment Lower Extremity Assessment: RLE deficits/detail;LLE deficits/detail RLE Deficits / Details: Decreased strength, grossly 2/5 overall.  Patient reports numbness from groin to foot. RLE Sensation: decreased light touch RLE Coordination: decreased gross motor LLE Deficits / Details: Decreased strength, grossly 3-/5.  Reports numbness from knee to foot. LLE Sensation: decreased light touch LLE Coordination: decreased gross motor       Communication   Communication: HOH  Cognition Arousal/Alertness: Awake/alert Behavior During Therapy: WFL for tasks assessed/performed Overall Cognitive Status: Within Functional Limits for tasks assessed  General Comments      Exercises     Assessment/Plan    PT Assessment Patient needs continued PT services  PT Problem List Decreased strength;Decreased activity tolerance;Decreased balance;Decreased mobility;Decreased coordination;Decreased knowledge of use of DME;Impaired sensation;Obesity       PT Treatment Interventions DME instruction;Gait training;Functional mobility training;Therapeutic activities;Therapeutic exercise;Balance training;Patient/family education    PT Goals (Current  goals can be found in the Care Plan section)  Acute Rehab PT Goals Patient Stated Goal: Return home with family PT Goal Formulation: With patient Time For Goal Achievement: 09/13/16 Potential to Achieve Goals: Fair    Frequency Min 3X/week   Barriers to discharge        Co-evaluation               AM-PAC PT "6 Clicks" Daily Activity  Outcome Measure Difficulty turning over in bed (including adjusting bedclothes, sheets and blankets)?: Total Difficulty moving from lying on back to sitting on the side of the bed? : Total Difficulty sitting down on and standing up from a chair with arms (e.g., wheelchair, bedside commode, etc,.)?: Total Help needed moving to and from a bed to chair (including a wheelchair)?: A Lot Help needed walking in hospital room?: A Lot Help needed climbing 3-5 steps with a railing? : Total 6 Click Score: 8    End of Session   Activity Tolerance: Patient limited by fatigue Patient left: in bed;with call bell/phone within reach;with bed alarm set   PT Visit Diagnosis: Difficulty in walking, not elsewhere classified (R26.2);Muscle weakness (generalized) (M62.81)    Time: 4742-5956 PT Time Calculation (min) (ACUTE ONLY): 15 min   Charges:   PT Evaluation $PT Eval Moderate Complexity: 1 Procedure     PT G Codes:        Carita Pian. Sanjuana Kava, [facility] Acute Rehab Services Pager [place] 09/06/2016, 9:58 PM

## 2016-09-06 NOTE — Progress Notes (Signed)
PROGRESS NOTE    Bill Dunn  HBZ:[id number] DOB: 12-30-1935 DOA: 09/04/2016 PCP: [name], MD    Brief Narrative [name] is a 81 y.o. male with medical history significant of Crohn's disease, hypertension, hyperlipidemia, TIA, SVT, obesity, chronic kidney disease-stage III, bladder cancer, AAA (s/p of repair 2012), multiple SBO, who presents with abdominal pain, nausea, abdominal distention, chest pain and SOB. Patient states that he has been having intermittent abdominal pain for 4 days, which has worsened today. He also has abdominal distention, nausea, but no vomiting. His last bowel movement was this afternoon. He is not passing any gas. Found to have partial SBO- being followed by General surgery    Assessment & Plan:   Principal Problem:   SBO (small bowel obstruction) (HCC) Active Problems:   Crohn's disease ([place])   HTN (hypertension)   Hypertriglyceridemia   History of TIA (transient ischemic attack)   Chest pain   Gastroesophageal reflux disease without esophagitis   Acute on chronic respiratory failure with hypoxia (HCC)   CKD (chronic kidney disease), stage III   Small bowel obstruction (HCC)  Partial small bowel obstruction Questionable etiology: hasCrohn's disease and multiple abdominal surgeries.  NG tube was placed-- now clamped with trial of clears for today General surgery following discussed with [name] -abd x ray pending  Crohn's disease Stable.  hypertension meds IV-- change back to PO when able   Gastroesophageal reflux disease PPI.  history of TIA Resume ASA/plavix when able  chronic kidney disease stage III Stable.  Thrombocytopenia -trending down -monitor--appears to be chronic---(has been as low as 92 in past- 2017)-- not getting lovenox/heparin  Chest pain -resolved in ER -CE negative -suspect related to bowels -echo: Technically difficult study. Contrast not given. LVEF 55-60%,   mild LVH, grade 1 DD  with indeterminate LV filling pressure,   normal biatrial size, trivial TR, RVSP 37 mmHg, normal IVC.  DVT prophylaxis: SCDs Code Status: DO NOT RESUSCITATE Family Communication: Updated patient. No family at bedside. Disposition Plan: Home vs SNF was small bowel obstruction is resolved and patient tolerating oral intake and per general surgery.   Consultants:   Gen. Surgery 09/05/2016  Procedures:   CT abdomen and pelvis 09/04/2016  Chest x-ray 09/05/2016  Abdominal films 09/05/2016  2-D echo 09/05/2016  Antimicrobials:   None   Subjective: Confused about what day and time it is-- thought it was night time. Says he had BM this AM and yesterda  Objective: Vitals:   09/05/16 1500 09/05/16 2047 09/06/16 0149 09/06/16 0454  BP: (!) 120/50 135/78 129/73 (!) 143/75  Pulse: 72 78 79 77  Resp: 16 18 17 19   Temp: 99.1 F (37.3 C) 99 F (37.2 C) 98.6 F (37 C) 98.4 F (36.9 C)  TempSrc: Oral Oral Oral Oral  SpO2: (!) 89% 95% 96% 93%  Weight:      Height:        Intake/Output Summary (Last 24 hours) at 09/06/16 1037 Last data filed at 09/06/16 0500  Gross per 24 hour  Intake          2766.66 ml  Output             2000 ml  Net           766.66 ml   Filed Weights   09/04/16 1746 09/05/16 0003  Weight: 111.1 kg (245 lb) 115.6 kg (254 lb 13.6 oz)    Examination:  General exam: Appears calm and comfortable. NGT clamped. Respiratory system: diminished,  no wheezing Cardiovascular system: S1 & S2 heard, RRR. No JVD, murmurs, rubs, gallops or clicks. No pedal edema. Gastrointestinal system: Abdomen is distended, hypoactive bowel sounds. Multiple abdominal scars. No organomegaly or masses felt.  Central nervous system: mildly confused about time of day Skin: No rashes, lesions or ulcers     Data Reviewed: I have personally reviewed following labs and imaging studies  CBC:  Recent Labs Lab 09/04/16 1920 09/05/16 0445 09/06/16 0433  WBC 8.2 5.8 4.8    NEUTROABS  --   --  2.5  HGB 14.4 14.0 12.3*  HCT 44.3 43.2 38.7*  MCV 86.9 86.7 87.4  PLT 138* 126* 924*   Basic Metabolic Panel:  Recent Labs Lab 09/04/16 1920 09/05/16 0445 09/06/16 0433  NA 135 137 140  K 3.9 4.1 3.9  CL 95* 97* 104  CO2 28 27 27   GLUCOSE 132* 121* 109*  BUN 28* 25* 27*  CREATININE 1.91* 1.59* 1.93*  CALCIUM 9.6 9.2 8.5*   GFR: Estimated Creatinine Clearance: 39.3 mL/min (A) (by C-G formula based on SCr of 1.93 mg/dL (H)). Liver Function Tests:  Recent Labs Lab 09/04/16 1920  AST 30  ALT 35  ALKPHOS 54  BILITOT 1.2  PROT 7.2  ALBUMIN 3.9    Recent Labs Lab 09/04/16 1920  LIPASE 42   No results for input(s): AMMONIA in the last 168 hours. Coagulation Profile:  Recent Labs Lab 09/05/16 0039  INR 1.12   Cardiac Enzymes:  Recent Labs Lab 09/05/16 0039 09/05/16 0445 09/05/16 1054  TROPONINI <0.03 <0.03 <0.03   BNP (last 3 results) No results for input(s): PROBNP in the last 8760 hours. HbA1C: No results for input(s): HGBA1C in the last 72 hours. CBG:  Recent Labs Lab 09/05/16 0831 09/06/16 0727  GLUCAP 108* 93   Lipid Profile:  Recent Labs  09/05/16 0445  CHOL 83  HDL 30*  LDLCALC 15  TRIG 192*  CHOLHDL 2.8   Thyroid Function Tests: No results for input(s): TSH, T4TOTAL, FREET4, T3FREE, THYROIDAB in the last 72 hours. Anemia Panel: No results for input(s): VITAMINB12, FOLATE, FERRITIN, TIBC, IRON, RETICCTPCT in the last 72 hours. Sepsis Labs: No results for input(s): PROCALCITON, LATICACIDVEN in the last 168 hours.  No results found for this or any previous visit (from the past 240 hour(s)).       Radiology Studies: Ct Abdomen Pelvis Wo Contrast  Result Date: 09/04/2016 CLINICAL DATA:  Crohn disease with abdominal pain for 4 days. Abdominal distention and vomiting since yesterday. EXAM: CT ABDOMEN AND PELVIS WITHOUT CONTRAST TECHNIQUE: Multidetector CT imaging of the abdomen and pelvis was performed  following the standard protocol without IV contrast. COMPARISON:  None. FINDINGS: Lower chest: The visualized cardiac chambers are mildly enlarged. No pericardial effusion. Bibasilar dependent atelectasis is identified. There is no pneumothorax. Minimal scarring along the periphery of the right lower lobe. Hepatobiliary: Cholecystectomy with reservoir effect likely accounting for the mild intrahepatic ductal dilatation. No space-occupying mass of the unenhanced liver is noted. Pancreas: Normal Spleen: Normal in size without mass.  Small adjacent splenule. Adrenals/Urinary Tract: The adrenal glands are normal bilaterally. There are circumscribed simple and complex cysts noted off the kidneys bilaterally some of which are hyperdense and may represent hemorrhagic or proteinaceous cysts, example series 3, image 44 off the interpolar left kidney measuring 1.6 cm. Others such as a 4.5 cm right lower pole cyst appear to containing milk-of-calcium along the dependent aspect. No nephrolithiasis or hydronephrosis. Further characterization is not possible given lack  of IV contrast. Stomach/Bowel: The stomach is mildly distended with fluid. There is mild-to-moderate dilatation of small intestine starting from the third portion of the duodenum through much of the jejunum with fluid-filled dilated small bowel loops measuring up to 2.9 cm in caliber. There appears be a transition point in the right lower quadrant for which a short segment stricture is not excluded contributing to early changes of small bowel obstruction. Beyond this, the small bowel appears decompressed leading up to an enterocolic anastomosis in the right lower quadrant. No significant mural or fold thickening to suggest acute inflammatory bowel or definite tethering to suggest an adhesion. Vascular/Lymphatic: Aortic and branch vessel atherosclerosis is identified at the origins of the celiac, SMA and both renal arteries. An infrarenal aortic stent graft is  identified extending into both common iliac arteries with additional separate right internal iliac artery stent noted secondary to a 2.2 cm aneurysm along the distal internal iliac artery. 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 No lymphadenopathy by CT size criteria is identified. Reproductive: Prostate is unremarkable. Other: No abdominal wall hernia or abnormality. No abdominopelvic ascites. Musculoskeletal: Atrophy of the right rectus muscle with overlying skin staples. Cannulated screw fixation of the right femoral neck with left hip arthroplasty. No acute nor suspicious osseous lesions. IMPRESSION: 1. Dilated to duodenal and jejunal loops up to 2.9 cm with transition point in the right lower quadrant possibly from a short segment stricture or remotely adhesion given history of Crohn disease and surgical changes seen in the right lower quadrant. No bowel perforation, ascites or free air. No evidence of acute inflammatory bowel. 2. Simple and complex bilateral renal cysts which could be further correlated with MRI on a nonemergent basis for better characterization. 3. Aortoiliac stent grafts as above. Right internal iliac stent traversing a 2.2 cm aneurysm. 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 4. Cholecystectomy. Electronically Signed   By: Ashley Royalty M.D.   On: 09/04/2016 21:55   Dg Chest 2 View  Result Date: 09/05/2016 CLINICAL DATA:  Mid chest pain. EXAM: CHEST  2 VIEW COMPARISON:  05/01/2016 FINDINGS: The heart is mildly enlarged. The aorta shows ectasia. Port-A-Cath shows stable appearance with the tip in the distal SVC. A nasogastric tube is present with the tip extending into the stomach. There is no evidence of pulmonary edema, consolidation, pneumothorax, nodule or pleural fluid. IMPRESSION: Cardiac enlargement.  No active cardiopulmonary disease.  Electronically Signed   By: Aletta Edouard M.D.   On: 09/05/2016 09:41   Dg Abd 1 View  Result Date: 09/05/2016 CLINICAL DATA:  Nausea and abdominal distention. Status post nasogastric tube placement. EXAM: ABDOMEN - 1 VIEW COMPARISON:  CT of the abdomen and pelvis on 09/04/2016. FINDINGS: The nasogastric tube extends into the proximal stomach. There is some degree of distention of the stomach with air. Visualized proximal small bowel loops show dilatation with maximal diameter of approximately 5.7 cm. Findings are suggestive of partial small bowel obstruction versus small bowel ileus. Evidence of prior abdominal wall hernia repair. The upper portion of an aortic endograft is visualized. IMPRESSION: Nasogastric tube extends into the stomach. The stomach is distended with air and there is evidence of dilatation of proximal small bowel consistent with partial obstruction versus ileus. Electronically Signed   By: Aletta Edouard M.D.   On: 09/05/2016 09:46   Dg Abd Portable 1v-small Bowel Obstruction Protocol-initial, 8  Hr Delay  Result Date: 09/05/2016 CLINICAL DATA:  Small bowel obstruction follow-up. EXAM: PORTABLE ABDOMEN - 1 VIEW COMPARISON:  09/05/2016 at 7:49 a.m. FINDINGS: NG tube is identified with tip overlying the mid stomach. Distended gas-filled loop of small bowel within the central/left abdomen is unchanged. Contrast within the colon and rectum are noted. No other changes identified. IMPRESSION: Distended gas-filled loops small bowel within the central/left abdomen is unchanged but contrast now identified within the colon suggesting partial small bowel obstruction. NG tube within the mid stomach. Electronically Signed   By: Margarette Canada M.D.   On: 09/05/2016 21:28        Scheduled Meds: . metoprolol  5 mg Intravenous Q8H  . sodium chloride flush  3 mL Intravenous Q12H   Continuous Infusions: . sodium chloride 100 mL/hr at 09/05/16 1517  . famotidine (PEPCID) IV Stopped (09/06/16 0953)       LOS: 2 days    Time spent: 40 minutes    [place], DO [facility] Pager [phone]  If 7PM-7AM, please contact night-coverage [email or web address] Password [email or web address] 09/06/2016, 10:37 AM

## 2016-09-06 NOTE — Progress Notes (Signed)
CC:  Pain, nausea, dominant distention, chest pain, shortness of breath. Subjective: He is much more with it this a.m. Talking, moving, Ng drainage is down and he is having stools.    Objective: Vital signs in last 24 hours: Temp:  [98.4 F (36.9 C)-99.1 F (37.3 C)] 98.4 F (36.9 C) (05/06 0454) Pulse Rate:  [72-79] 77 (05/06 0454) Resp:  [16-19] 19 (05/06 0454) BP: (120-143)/(50-78) 143/75 (05/06 0454) SpO2:  [89 %-96 %] 93 % (05/06 0454) Last BM Date: 09/06/16   PO:  60 IV 2700 Urine 450 1550 NG BM x 3/ 1 BM this AM Afebrile, VSS Creatinine is up to 1.93 SBProtocol 9PM film: Distended gas-filled loops small bowel within the central/left abdomen is unchanged but contrast now identified within the colon suggesting partial small bowel obstruction. NG tube within the mid stomach.   Intake/Output from previous day: 05/05 0701 - 05/06 0700 In: 2771.7 [P.O.:60; I.V.:2526.7; NG/GT:30; IV Piggyback:155] Out: 2000 [Urine:450; Emesis/NG output:1550] Intake/Output this shift: No intake/output data recorded.  General appearance: alert, cooperative and no distress Resp: clear to auscultation bilaterally GI: Soft, few bowel sounds. Positive BM. Nontender to palpation.  Lab Results:   Recent Labs  09/05/16 0445 09/06/16 0433  WBC 5.8 4.8  HGB 14.0 12.3*  HCT 43.2 38.7*  PLT 126* 113*    BMET  Recent Labs  09/05/16 0445 09/06/16 0433  NA 137 140  K 4.1 3.9  CL 97* 104  CO2 27 27  GLUCOSE 121* 109*  BUN 25* 27*  CREATININE 1.59* 1.93*  CALCIUM 9.2 8.5*   PT/INR  Recent Labs  09/05/16 0039  LABPROT 14.5  INR 1.12     Recent Labs Lab 09/04/16 1920  AST 30  ALT 35  ALKPHOS 54  BILITOT 1.2  PROT 7.2  ALBUMIN 3.9     Lipase     Component Value Date/Time   LIPASE 42 09/04/2016 1920     Studies/Results: Ct Abdomen Pelvis Wo Contrast  Result Date: 09/04/2016 CLINICAL DATA:  Crohn disease with abdominal pain for 4 days. Abdominal distention  and vomiting since yesterday. EXAM: CT ABDOMEN AND PELVIS WITHOUT CONTRAST TECHNIQUE: Multidetector CT imaging of the abdomen and pelvis was performed following the standard protocol without IV contrast. COMPARISON:  None. FINDINGS: Lower chest: The visualized cardiac chambers are mildly enlarged. No pericardial effusion. Bibasilar dependent atelectasis is identified. There is no pneumothorax. Minimal scarring along the periphery of the right lower lobe. Hepatobiliary: Cholecystectomy with reservoir effect likely accounting for the mild intrahepatic ductal dilatation. No space-occupying mass of the unenhanced liver is noted. Pancreas: Normal Spleen: Normal in size without mass.  Small adjacent splenule. Adrenals/Urinary Tract: The adrenal glands are normal bilaterally. There are circumscribed simple and complex cysts noted off the kidneys bilaterally some of which are hyperdense and may represent hemorrhagic or proteinaceous cysts, example series 3, image 44 off the interpolar left kidney measuring 1.6 cm. Others such as a 4.5 cm right lower pole cyst appear to containing milk-of-calcium along the dependent aspect. No nephrolithiasis or hydronephrosis. Further characterization is not possible given lack of IV contrast. Stomach/Bowel: The stomach is mildly distended with fluid. There is mild-to-moderate dilatation of small intestine starting from the third portion of the duodenum through much of the jejunum with fluid-filled dilated small bowel loops measuring up to 2.9 cm in caliber. There appears be a transition point in the right lower quadrant for which a short segment stricture is not excluded contributing to early changes of small bowel  obstruction. Beyond this, the small bowel appears decompressed leading up to an enterocolic anastomosis in the right lower quadrant. No significant mural or fold thickening to suggest acute inflammatory bowel or definite tethering to suggest an adhesion. Vascular/Lymphatic:  Aortic and branch vessel atherosclerosis is identified at the origins of the celiac, SMA and both renal arteries. An infrarenal aortic stent graft is identified extending into both common iliac arteries with additional separate right internal iliac artery stent noted secondary to a 2.2 cm aneurysm along the distal internal iliac artery. 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 No lymphadenopathy by CT size criteria is identified. Reproductive: Prostate is unremarkable. Other: No abdominal wall hernia or abnormality. No abdominopelvic ascites. Musculoskeletal: Atrophy of the right rectus muscle with overlying skin staples. Cannulated screw fixation of the right femoral neck with left hip arthroplasty. No acute nor suspicious osseous lesions. IMPRESSION: 1. Dilated to duodenal and jejunal loops up to 2.9 cm with transition point in the right lower quadrant possibly from a short segment stricture or remotely adhesion given history of Crohn disease and surgical changes seen in the right lower quadrant. No bowel perforation, ascites or free air. No evidence of acute inflammatory bowel. 2. Simple and complex bilateral renal cysts which could be further correlated with MRI on a nonemergent basis for better characterization. 3. Aortoiliac stent grafts as above. Right internal iliac stent traversing a 2.2 cm aneurysm. 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 4. Cholecystectomy. Electronically Signed   By: Ashley Royalty M.D.   On: 09/04/2016 21:55   Dg Chest 2 View  Result Date: 09/05/2016 CLINICAL DATA:  Mid chest pain. EXAM: CHEST  2 VIEW COMPARISON:  05/01/2016 FINDINGS: The heart is mildly enlarged. The aorta shows ectasia. Port-A-Cath shows stable appearance with the tip in the distal SVC. A nasogastric tube is present with the tip extending into the stomach. There is no evidence  of pulmonary edema, consolidation, pneumothorax, nodule or pleural fluid. IMPRESSION: Cardiac enlargement.  No active cardiopulmonary disease. Electronically Signed   By: Aletta Edouard M.D.   On: 09/05/2016 09:41   Dg Abd 1 View  Result Date: 09/05/2016 CLINICAL DATA:  Nausea and abdominal distention. Status post nasogastric tube placement. EXAM: ABDOMEN - 1 VIEW COMPARISON:  CT of the abdomen and pelvis on 09/04/2016. FINDINGS: The nasogastric tube extends into the proximal stomach. There is some degree of distention of the stomach with air. Visualized proximal small bowel loops show dilatation with maximal diameter of approximately 5.7 cm. Findings are suggestive of partial small bowel obstruction versus small bowel ileus. Evidence of prior abdominal wall hernia repair. The upper portion of an aortic endograft is visualized. IMPRESSION: Nasogastric tube extends into the stomach. The stomach is distended with air and there is evidence of dilatation of proximal small bowel consistent with partial obstruction versus ileus. Electronically Signed   By: Aletta Edouard M.D.   On: 09/05/2016 09:46   Dg Abd Portable 1v-small Bowel Obstruction Protocol-initial, 8 Hr Delay  Result Date: 09/05/2016 CLINICAL DATA:  Small bowel obstruction follow-up. EXAM: PORTABLE ABDOMEN - 1 VIEW COMPARISON:  09/05/2016 at 7:49 a.m. FINDINGS: NG tube is identified with tip overlying the mid stomach. Distended gas-filled loop of small bowel within the central/left abdomen is unchanged. Contrast within the colon and rectum are noted. No other changes identified. IMPRESSION: Distended gas-filled loops small bowel within the central/left abdomen is  unchanged but contrast now identified within the colon suggesting partial small bowel obstruction. NG tube within the mid stomach. Electronically Signed   By: Margarette Canada M.D.   On: 09/05/2016 21:28    Medications: . metoprolol  5 mg Intravenous Q8H  . sodium chloride flush  3 mL  Intravenous Q12H    Assessment/Plan Small bowel obstruction Crohn's disease with multiple surgeries. Status post abdominal aortic aneurysm stenting 2012. History of bladder cancer  126K this AM Thrombocytopenia  History of chronic kidney disease stage III  - creatinine is rising Right middle lung nodule Morbid obesity Body mass index is 38. CAD/history of SVT History of TIA Dyslipidemia DNR FEN:  IV fluids/nothing by mouth ID:  None DVT:  SCDs   Plan: Clamping trials today I'll recheck a plain film tomorrow. Hopefully we can pull his NG in the a.m. and put him on clears. I'll put him on ice chips and sips of clears for today. If he has nausea or distention we can resume intermittent suction.     LOS: 2 days    Gerilynn Mccullars 09/06/2016 [phone]

## 2016-09-07 ENCOUNTER — Inpatient Hospital Stay (HOSPITAL_COMMUNITY): Payer: Medicare Other

## 2016-09-07 DIAGNOSIS — W19XXXD Unspecified fall, subsequent encounter: Secondary | ICD-10-CM

## 2016-09-07 LAB — CBC WITH DIFFERENTIAL/PLATELET
Basophils Absolute: 0 10*3/uL (ref 0.0–0.1)
Basophils Relative: 0 %
EOS ABS: 0.1 10*3/uL (ref 0.0–0.7)
EOS PCT: 2 %
HCT: 37.4 % — ABNORMAL LOW (ref 39.0–52.0)
Hemoglobin: 12.2 g/dL — ABNORMAL LOW (ref 13.0–17.0)
LYMPHS ABS: 1.5 10*3/uL (ref 0.7–4.0)
LYMPHS PCT: 33 %
MCH: 28.4 pg (ref 26.0–34.0)
MCHC: 32.6 g/dL (ref 30.0–36.0)
MCV: 87.2 fL (ref 78.0–100.0)
MONO ABS: 0.4 10*3/uL (ref 0.1–1.0)
MONOS PCT: 10 %
Neutro Abs: 2.5 10*3/uL (ref 1.7–7.7)
Neutrophils Relative %: 55 %
Platelets: 112 10*3/uL — ABNORMAL LOW (ref 150–400)
RBC: 4.29 MIL/uL (ref 4.22–5.81)
RDW: 14.2 % (ref 11.5–15.5)
WBC: 4.5 10*3/uL (ref 4.0–10.5)

## 2016-09-07 LAB — GLUCOSE, CAPILLARY
GLUCOSE-CAPILLARY: 96 mg/dL (ref 65–99)
GLUCOSE-CAPILLARY: 130 mg/dL — AB (ref 65–99)

## 2016-09-07 LAB — BASIC METABOLIC PANEL
Anion gap: 9 (ref 5–15)
BUN: 18 mg/dL (ref 6–20)
CALCIUM: 8.2 mg/dL — AB (ref 8.9–10.3)
CO2: 26 mmol/L (ref 22–32)
CREATININE: 1.52 mg/dL — AB (ref 0.61–1.24)
Chloride: 104 mmol/L (ref 101–111)
GFR calc Af Amer: 49 mL/min — ABNORMAL LOW (ref >60)
GFR calc non Af Amer: 42 mL/min — ABNORMAL LOW (ref >60)
Glucose, Bld: 103 mg/dL — ABNORMAL HIGH (ref 65–99)
POTASSIUM: 3.7 mmol/L (ref 3.5–5.1)
SODIUM: 139 mmol/L (ref 135–145)

## 2016-09-07 LAB — MAGNESIUM: MAGNESIUM: 1.4 mg/dL — AB (ref 1.7–2.4)

## 2016-09-07 MED ORDER — ASPIRIN EC 81 MG PO TBEC
81.0000 mg | DELAYED_RELEASE_TABLET | Freq: Every day | ORAL | Status: DC
  Administered 2016-09-07 – 2016-09-10 (×4): 81 mg via ORAL
  Filled 2016-09-07 (×4): qty 1

## 2016-09-07 MED ORDER — MAGNESIUM SULFATE 4 GM/100ML IV SOLN
4.0000 g | Freq: Once | INTRAVENOUS | Status: AC
  Administered 2016-09-07: 4 g via INTRAVENOUS
  Filled 2016-09-07: qty 100

## 2016-09-07 NOTE — Progress Notes (Signed)
CC: Pain, nausea, abdominal distention, chest pain, shortness of breath    Subjective: No nausea or vomiting. NG clamped most of the day. Taking some sips and chips without difficulty. Not much recorded. He had one bowel movement yesterday result was rather mushy. Has some discomfort in his abdomen from slipping in his room. Still little bit distended, some high-pitched bowel sounds. He remains alert and oriented today.  Objective: Vital signs in last 24 hours: Temp:  [98.2 F (36.8 C)-98.8 F (37.1 C)] 98.8 F (37.1 C) (05/07 0405) Pulse Rate:  [79-82] 82 (05/07 0405) Resp:  [16-20] 18 (05/07 0405) BP: (138-163)/(69-80) 163/80 (05/07 0405) SpO2:  [92 %-98 %] 98 % (05/07 0405) Last BM Date: 09/06/16 240 PO 2600 IV Urine 2000 NG 200 BM 1 Afebrile vital signs are stable No labs this a.m. Film pending Intake/Output from previous day: 05/06 0701 - 05/07 0700 In: 2820 [P.O.:240; I.V.:2480; IV Piggyback:100] Out: 2200 [Urine:2000; Emesis/NG output:200] Intake/Output this shift: No intake/output data recorded.  General appearance: alert, cooperative and no distress GI: Still mildly distended, positive bowel sounds. Bowel sounds are rather high-pitched. One BM reported yesterday.  Lab Results:   Recent Labs  09/05/16 0445 09/06/16 0433  WBC 5.8 4.8  HGB 14.0 12.3*  HCT 43.2 38.7*  PLT 126* 113*    BMET  Recent Labs  09/05/16 0445 09/06/16 0433  NA 137 140  K 4.1 3.9  CL 97* 104  CO2 27 27  GLUCOSE 121* 109*  BUN 25* 27*  CREATININE 1.59* 1.93*  CALCIUM 9.2 8.5*   PT/INR  Recent Labs  09/05/16 0039  LABPROT 14.5  INR 1.12     Recent Labs Lab 09/04/16 1920  AST 30  ALT 35  ALKPHOS 54  BILITOT 1.2  PROT 7.2  ALBUMIN 3.9     Lipase     Component Value Date/Time   LIPASE 42 09/04/2016 1920     Studies/Results: Dg Abd Portable 1v-small Bowel Obstruction Protocol-initial, 8 Hr Delay  Result Date: 09/05/2016 CLINICAL DATA:  Small bowel  obstruction follow-up. EXAM: PORTABLE ABDOMEN - 1 VIEW COMPARISON:  09/05/2016 at 7:49 a.m. FINDINGS: NG tube is identified with tip overlying the mid stomach. Distended gas-filled loop of small bowel within the central/left abdomen is unchanged. Contrast within the colon and rectum are noted. No other changes identified. IMPRESSION: Distended gas-filled loops small bowel within the central/left abdomen is unchanged but contrast now identified within the colon suggesting partial small bowel obstruction. NG tube within the mid stomach. Electronically Signed   By: Margarette Canada M.D.   On: 09/05/2016 21:28    Medications: . metoprolol  5 mg Intravenous Q8H  . sodium chloride flush  3 mL Intravenous Q12H    Assessment/Plan Small bowel obstruction Crohn's disease with multiple surgeries. Status post abdominal aortic aneurysm stenting 2012. History of bladder cancer 126K this AM Thrombocytopenia  History of chronic kidney disease stage III  - creatinine is rising Right middle lung nodule Morbid obesity Body mass index is 38. CAD/history of SVT History of TIA Dyslipidemia DNR FEN: IV fluids/NPO except ice chips/sips ID: None DVT: SCDs  Plan: He has a film ordered for this a.m. I will await that film before making recommendations on pulling the NG, but it sounds like he did well with the NG off most of yesterday. I resumed LIWS the with NG and so far there is been no drainage.   Film:  No evidence of bowel obstruction. Distended small bowel loops have normalized.  There is contrast now in the rectum.  DC NG and start clears.    LOS: 3 days    Mead Slane 09/07/2016 [phone]

## 2016-09-07 NOTE — Evaluation (Signed)
Occupational Therapy Evaluation Patient Details Name: Bill Dunn MRN: [id number] DOB: 1936/03/08 Today's Date: 09/07/2016    History of Present Illness Patient is a 81 yo male admitted 09/04/16 with abdominal pain, nausea, chest pain, SOB, confusion.  Patient with partial SBO.    PMH:  LE weakness since 2007, Crohn's disease, HTN, HLD, TIA, XVT, obesity, CKD, mult SBO   Clinical Impression   PTA, pt lived with his wife and required Max A for basic ADLs. Currently, pt requires Max A for ADLs at bed level. Additionally, pt declined to perform ADLs and mobility at this time due to pain. Pt would benefit from acute OT to increase his occupational performance and participation. Feel he is close to baseline function. Recommend dc home once medically stable per physician.     Follow Up Recommendations  No OT follow up;Supervision/Assistance - 24 hour    Equipment Recommendations  None recommended by OT (Pt confirms to have all needed DME/AE)    Recommendations for Other Services       Precautions / Restrictions Precautions Precautions: Fall Restrictions Weight Bearing Restrictions: No      Mobility Bed Mobility Overal bed mobility: Needs Assistance Bed Mobility: Supine to Sit     Supine to sit: Min assist     General bed mobility comments: Declined to move in bed  Transfers Overall transfer level: Needs assistance Equipment used: Rolling walker (2 wheeled) Transfers: Sit to/from [facility] Sit to Stand: Min assist         General transfer comment: Declined to move OOB    Balance Overall balance assessment: Needs assistance Sitting-balance support: No upper extremity supported;Feet supported Sitting balance-Leahy Scale: Fair     Standing balance support: Bilateral upper extremity supported Standing balance-Leahy Scale: Poor Standing balance comment: reliant on RW                           ADL either performed or assessed with clinical  judgement   ADL Overall ADL's : Needs assistance/impaired                                       General ADL Comments: Pt ADL assessment limited due to pt declining to move from bed stating that he is in too much pain.  Feel pt is near baseline funciton and requiring Max A for ADLs which his wife performed PTA. Pt also reports that he is performing all toileting needs at bed level with urinal and bedpan.      Vision         Perception     Praxis      Pertinent Vitals/Pain Pain Assessment: 0-10 Pain Score: 3  Pain Location: Stomach Pain Descriptors / Indicators: Constant;Guarding;Grimacing Pain Intervention(s): Limited activity within patient's tolerance     Hand Dominance     Extremity/Trunk Assessment Upper Extremity Assessment Upper Extremity Assessment: Generalized weakness   Lower Extremity Assessment Lower Extremity Assessment: Defer to PT evaluation       Communication Communication Communication: HOH   Cognition Arousal/Alertness: Awake/alert Behavior During Therapy: WFL for tasks assessed/performed Overall Cognitive Status: Within Functional Limits for tasks assessed                                     General Comments  Evaluation  limited due to pt declining to perform ADLs and mobility    Exercises Exercises: Other exercises Total Joint Exercises Ankle Circles/Pumps: AROM;Both;10 reps;Seated Long Arc Quad: AROM;Both;10 reps;Seated Marching in Standing: AROM;Both;5 reps;Seated (performed while seated, not standing)   Shoulder Instructions      Home Living Family/patient expects to be discharged to:: Private residence Living Arrangements: Spouse/significant other;Children;Other relatives (Wife, 2 dtrs, 1 son-in-law, 2 grandchildren) Available Help at Discharge: Family;Available 24 hours/day Type of Home: House       Home Layout: Able to live on main level with bedroom/bathroom     Bathroom Shower/Tub: Sport and exercise psychologist Toilet: Handicapped height     Home Equipment: Wheelchair - power;Walker - 2 wheels;Shower seat;Cane - single point;Wheelchair - manual;Grab bars - toilet;Grab bars - tub/shower;Hand held shower head;Adaptive equipment Adaptive Equipment: Reacher;Sock aid        Prior Functioning/Environment Level of Independence: Needs assistance  Gait / Transfers Assistance Needed: Pt used power w/c ADL's / Homemaking Assistance Needed: Pt requires A from wife to perform basic ADLs and funcitonal transfers            OT Problem List: Decreased strength;Decreased range of motion;Decreased activity tolerance;Impaired balance (sitting and/or standing);Decreased knowledge of use of DME or AE;Decreased safety awareness;Decreased knowledge of precautions;Pain      OT Treatment/Interventions: Self-care/ADL training;Therapeutic exercise;Energy conservation;DME and/or AE instruction;Therapeutic activities;Patient/family education    OT Goals(Current goals can be found in the care plan section) Acute Rehab OT Goals Patient Stated Goal: Return home with family OT Goal Formulation: With patient Time For Goal Achievement: 09/21/16 Potential to Achieve Goals: Good ADL Goals Pt Will Perform Grooming: with min assist;sitting Pt Will Perform Upper Body Dressing: with adaptive equipment;sitting;with min assist Pt Will Perform Lower Body Dressing: with adaptive equipment;sit to/from stand;with mod assist Pt Will Transfer to Toilet: with mod assist;stand pivot transfer;bedside commode  OT Frequency: Min 2X/week   Barriers to D/C:            Co-evaluation              AM-PAC PT "6 Clicks" Daily Activity     Outcome Measure Help from another person eating meals?: A Little Help from another person taking care of personal grooming?: A Little Help from another person toileting, which includes using toliet, bedpan, or urinal?: A Lot Help from another person bathing (including washing,  rinsing, drying)?: A Lot Help from another person to put on and taking off regular upper body clothing?: A Lot Help from another person to put on and taking off regular lower body clothing?: A Lot 6 Click Score: 14   End of Session    Activity Tolerance: Patient limited by pain Patient left: in bed;with call bell/phone within reach  OT Visit Diagnosis: Unsteadiness on feet (R26.81);Muscle weakness (generalized) (M62.81);Pain Pain - Right/Left: Right Pain - part of body:  (Abdomen)                Time: 8502-7741 OT Time Calculation (min): 14 min Charges:  OT General Charges $OT Visit: 1 Procedure OT Evaluation $OT Eval Low Complexity: 1 Procedure G-Codes:     Mearl Olver, OTR/L [phone]  [place] 09/07/2016, 2:40 PM

## 2016-09-07 NOTE — Progress Notes (Signed)
Physical Therapy Treatment Patient Details Name: Bill Dunn MRN: [id number] DOB: November 29, 1935 Today's Date: 09/07/2016    History of Present Illness Patient is a 81 yo male admitted 09/04/16 with abdominal pain, nausea, chest pain, SOB, confusion.  Patient with partial SBO.    PMH:  LE weakness since 2007, Crohn's disease, HTN, HLD, TIA, XVT, obesity, CKD, mult SBO    PT Comments    Pt required min assist for bed mobilities and transfers today. Increased pain noted during HEP. Pt with increased weakness on the R LE. Patient was seen immediately after having NG tube removed. Pt coughed up blood at end of session. RN notified. Pt should benefit from continued PT. Will continue to follow acutely.    Follow Up Recommendations  Outpatient PT;Supervision for mobility/OOB (Patient reports he has OP PT at the [facility])     Equipment Recommendations  Other (comment) (Possibly lift equipment - will continue to assess)    Recommendations for Other Services OT consult     Precautions / Restrictions Precautions Precautions: Fall Restrictions Weight Bearing Restrictions: No    Mobility  Bed Mobility Overal bed mobility: Needs Assistance Bed Mobility: Supine to Sit     Supine to sit: Min assist     General bed mobility comments: Min HHA for pt to elevate trunk and scoot fwd to EOB.   Transfers Overall transfer level: Needs assistance Equipment used: Rolling walker (2 wheeled) Transfers: Sit to/from [facility] Sit to Stand: Min assist         General transfer comment: min assist for pt to power up into standing. Mod cueing required for technique and hand placement. Pt able to take small steps to recliner.   Ambulation/Gait                 Stairs            Wheelchair Mobility    Modified Rankin (Stroke Patients Only)       Balance Overall balance assessment: Needs assistance Sitting-balance support: No upper extremity supported;Feet  supported Sitting balance-Leahy Scale: Fair     Standing balance support: Bilateral upper extremity supported Standing balance-Leahy Scale: Poor Standing balance comment: reliant on RW                            Cognition Arousal/Alertness: Awake/alert Behavior During Therapy: WFL for tasks assessed/performed Overall Cognitive Status: Within Functional Limits for tasks assessed                                        Exercises Total Joint Exercises Ankle Circles/Pumps: AROM;Both;10 reps;Seated Long Arc Quad: AROM;Both;10 reps;Seated Marching in Standing: AROM;Both;5 reps;Seated (performed while seated, not standing)    General Comments        Pertinent Vitals/Pain      Home Living                      Prior Function            PT Goals (current goals can now be found in the care plan section) Acute Rehab PT Goals Patient Stated Goal: Return home with family PT Goal Formulation: With patient Time For Goal Achievement: 09/13/16 Potential to Achieve Goals: Fair Progress towards PT goals: Progressing toward goals    Frequency    Min 3X/week  PT Plan Current plan remains appropriate    Co-evaluation              AM-PAC PT "6 Clicks" Daily Activity  Outcome Measure  Difficulty turning over in bed (including adjusting bedclothes, sheets and blankets)?: Total Difficulty moving from lying on back to sitting on the side of the bed? : Total Difficulty sitting down on and standing up from a chair with arms (e.g., wheelchair, bedside commode, etc,.)?: A Little Help needed moving to and from a bed to chair (including a wheelchair)?: A Little Help needed walking in hospital room?: A Lot Help needed climbing 3-5 steps with a railing? : Total 6 Click Score: 11    End of Session Equipment Utilized During Treatment: Gait belt Activity Tolerance: Patient limited by fatigue Patient left: with call bell/phone within reach;in  chair;with family/visitor present Nurse Communication: Other (comment) (Coughing up blood after having NG tube removed) PT Visit Diagnosis: Difficulty in walking, not elsewhere classified (R26.2);Muscle weakness (generalized) (M62.81)     Time: 3976-7341 PT Time Calculation (min) (ACUTE ONLY): 19 min  Charges:  $Therapeutic Activity: 8-22 mins                    G Codes:       Benjiman Core, PTA Acute Rehab [place] 09/07/2016, 1:15 PM

## 2016-09-07 NOTE — Care Management Note (Signed)
Case Management Note  Patient Details  Name: Bill Dunn MRN: [id number] Date of Birth: 15-Jun-1935  Subjective/Objective:                    Action/Plan:  Will continue to follow for discharge needs Expected Discharge Date:  09/08/16               Expected Discharge Plan:  Home/Self Care  In-House Referral:     Discharge planning Services     Post Acute Care Choice:    Choice offered to:     DME Arranged:    DME Agency:     HH Arranged:    HH Agency:     Status of Service:  In process, will continue to follow  If discussed at Long Length of Stay Meetings, dates discussed:    Additional Comments:  Marilu Favre, RN 09/07/2016, 10:53 AM

## 2016-09-07 NOTE — Progress Notes (Signed)
PROGRESS NOTE    Bill Dunn  UJW:[id number] DOB: February 12, 1936 DOA: 09/04/2016 PCP: [name], MD    Brief Narrative Bill Dunn is a 81 y.o. male with medical history significant of Crohn's disease, hypertension, hyperlipidemia, TIA, SVT, obesity, chronic kidney disease-stage III, bladder cancer, AAA (s/p of repair 2012), multiple SBO, who presents with abdominal pain, nausea, abdominal distention, chest pain and SOB. Patient states that he has been having intermittent abdominal pain for 4 days, which has worsened today. He also has abdominal distention, nausea, but no vomiting. His last bowel movement was this afternoon. He is not passing any gas. Found to have partial SBO- being followed by General surgery    Assessment & Plan:   Principal Problem:   SBO (small bowel obstruction) (HCC) Active Problems:   Crohn's disease ([place])   HTN (hypertension)   Hypertriglyceridemia   History of TIA (transient ischemic attack)   Chest pain   Gastroesophageal reflux disease without esophagitis   Acute on chronic respiratory failure with hypoxia (HCC)   CKD (chronic kidney disease), stage III   Small bowel obstruction (HCC)  Partial small bowel obstruction Questionable etiology: hasCrohn's disease and multiple abdominal surgeries.  NG tube was placed-- now clamped with trial of clears which patient tolerated. Patient with flatus. Patient with positive bowel movements. NG tube to be discontinued today per general surgery. Abdominal x-ray today with no evidence of bowel obstruction. Distended small bowel loops have normalized. Contrast now in the rectum. Diet to be advanced. General surgery following discussed with [name]  Crohn's disease Stable.  hypertension meds IV-- change back to PO when able   Gastroesophageal reflux disease PPI.  history of TIA Resume ASA/plavix.   chronic kidney disease stage III Stable.  Thrombocytopenia -trending  down -monitor--appears to be chronic---(has been as low as 92 in past- 2017)-- not getting lovenox/heparin  Chest pain -resolved in ER -CE negative -suspect related to bowels -echo: Technically difficult study. Contrast not given. LVEF 55-60%,   mild LVH, grade 1 DD with indeterminate LV filling pressure,   normal biatrial size, trivial TR, RVSP 37 mmHg, normal IVC.  DVT prophylaxis: SCDs Code Status: DO NOT RESUSCITATE Family Communication: Updated patient. No family at bedside. Disposition Plan: Home vs SNF was small bowel obstruction is resolved and patient tolerating oral intake and per general surgery.   Consultants:   Gen. Surgery 09/05/2016  Procedures:   CT abdomen and pelvis 09/04/2016  Chest x-ray 09/05/2016  Abdominal films 09/05/2016  2-D echo 09/05/2016  Antimicrobials:   None   Subjective: Patient alert. Denies any nausea or vomiting. Denies any abdominal pain. Patient states he is passing flatus. Positive bowel movement today per patient. Patient asking for telemetry to be discontinued.  Objective: Vitals:   09/06/16 1037 09/06/16 1306 09/06/16 1932 09/07/16 0405  BP: 138/70 (!) 141/73 (!) 154/69 (!) 163/80  Pulse: 80 79 80 82  Resp: 20 18 16 18   Temp: 98.3 F (36.8 C) 98.2 F (36.8 C) 98.8 F (37.1 C) 98.8 F (37.1 C)  TempSrc: Oral Oral Oral Oral  SpO2: 92% 92% 93% 98%  Weight:      Height:        Intake/Output Summary (Last 24 hours) at 09/07/16 1146 Last data filed at 09/07/16 0547  Gross per 24 hour  Intake             2820 ml  Output             2000 ml  Net  820 ml   Filed Weights   09/04/16 1746 09/05/16 0003  Weight: 111.1 kg (245 lb) 115.6 kg (254 lb 13.6 oz)    Examination:  General exam: Appears calm and comfortable. NGT clamped. Respiratory system: CTAB anterior lung fields. Cardiovascular system: S1 & S2 heard, RRR. No JVD, murmurs, rubs, gallops or clicks. No pedal edema. Gastrointestinal system:  Abdomen is nondistended, hypoactive bowel sounds. Multiple abdominal scars. No organomegaly or masses felt.  Central nervous system: alert and oriented. Judgment and insight good. Mood is appropriate. Skin: No rashes, lesions or ulcers     Data Reviewed: I have personally reviewed following labs and imaging studies  CBC:  Recent Labs Lab 09/04/16 1920 09/05/16 0445 09/06/16 0433 09/07/16 1009  WBC 8.2 5.8 4.8 4.5  NEUTROABS  --   --  2.5 2.5  HGB 14.4 14.0 12.3* 12.2*  HCT 44.3 43.2 38.7* 37.4*  MCV 86.9 86.7 87.4 87.2  PLT 138* 126* 113* 350*   Basic Metabolic Panel:  Recent Labs Lab 09/04/16 1920 09/05/16 0445 09/06/16 0433 09/07/16 1009  NA 135 137 140 139  K 3.9 4.1 3.9 3.7  CL 95* 97* 104 104  CO2 28 27 27 26   GLUCOSE 132* 121* 109* 103*  BUN 28* 25* 27* 18  CREATININE 1.91* 1.59* 1.93* 1.52*  CALCIUM 9.6 9.2 8.5* 8.2*  MG  --   --   --  1.4*   GFR: Estimated Creatinine Clearance: 49.9 mL/min (A) (by C-G formula based on SCr of 1.52 mg/dL (H)). Liver Function Tests:  Recent Labs Lab 09/04/16 1920  AST 30  ALT 35  ALKPHOS 54  BILITOT 1.2  PROT 7.2  ALBUMIN 3.9    Recent Labs Lab 09/04/16 1920  LIPASE 42   No results for input(s): AMMONIA in the last 168 hours. Coagulation Profile:  Recent Labs Lab 09/05/16 0039  INR 1.12   Cardiac Enzymes:  Recent Labs Lab 09/05/16 0039 09/05/16 0445 09/05/16 1054  TROPONINI <0.03 <0.03 <0.03   BNP (last 3 results) No results for input(s): PROBNP in the last 8760 hours. HbA1C:  Recent Labs  09/05/16 0444  HGBA1C 5.6   CBG:  Recent Labs Lab 09/05/16 0831 09/06/16 0727 09/07/16 0744  GLUCAP 108* 93 96   Lipid Profile:  Recent Labs  09/05/16 0445  CHOL 83  HDL 30*  LDLCALC 15  TRIG 192*  CHOLHDL 2.8   Thyroid Function Tests: No results for input(s): TSH, T4TOTAL, FREET4, T3FREE, THYROIDAB in the last 72 hours. Anemia Panel: No results for input(s): VITAMINB12, FOLATE,  FERRITIN, TIBC, IRON, RETICCTPCT in the last 72 hours. Sepsis Labs: No results for input(s): PROCALCITON, LATICACIDVEN in the last 168 hours.  No results found for this or any previous visit (from the past 240 hour(s)).       Radiology Studies: Dg Abd 1 View  Result Date: 09/07/2016 CLINICAL DATA:  Approximately 24 hour film, small-bowel follow-through study. Abdominal distention. EXAM: ABDOMEN - 1 VIEW COMPARISON:  Portable abdominal radiographs of Sep 05, 2016 FINDINGS: The previously administered contrast has traversed the small and large bowel and now lies in the rectum. Previously demonstrated contrast in the left colon has cleared. Distended loops of small bowel demonstrated previously are not evident today. The nasogastric tube tip lies in the mid gastric body. There is an aorto bi-iliac stent graft in place. There are embolization coils in within the left aspect of the pelvis. There is an internal iliac artery stent graft on the right. Numerous wire  sutures are present in the right lower quadrant of the abdomen. There is a prosthetic left hip joint and there screws from previous right hip fracture fixation. IMPRESSION: No evidence of bowel obstruction. Distended small bowel loops have normalized. There is contrast now in the rectum. Electronically Signed   By: David  Martinique M.D.   On: 09/07/2016 09:45   Dg Abd Portable 1v-small Bowel Obstruction Protocol-initial, 8 Hr Delay  Result Date: 09/05/2016 CLINICAL DATA:  Small bowel obstruction follow-up. EXAM: PORTABLE ABDOMEN - 1 VIEW COMPARISON:  09/05/2016 at 7:49 a.m. FINDINGS: NG tube is identified with tip overlying the mid stomach. Distended gas-filled loop of small bowel within the central/left abdomen is unchanged. Contrast within the colon and rectum are noted. No other changes identified. IMPRESSION: Distended gas-filled loops small bowel within the central/left abdomen is unchanged but contrast now identified within the colon suggesting  partial small bowel obstruction. NG tube within the mid stomach. Electronically Signed   By: Margarette Canada M.D.   On: 09/05/2016 21:28        Scheduled Meds: . metoprolol  5 mg Intravenous Q8H  . sodium chloride flush  3 mL Intravenous Q12H   Continuous Infusions: . sodium chloride 100 mL/hr at 09/07/16 0524  . famotidine (PEPCID) IV Stopped (09/07/16 1043)  . magnesium sulfate 1 - 4 g bolus IVPB       LOS: 3 days    Time spent: 40 minutes    Celinda Dethlefs, MD [facility] Pager [phone]  If 7PM-7AM, please contact night-coverage [email or web address] Password [email or web address] 09/07/2016, 11:46 AM

## 2016-09-07 NOTE — Care Management Note (Signed)
Case Management Note  Patient Details  Name: Bill Dunn MRN: [id number] Date of Birth: [birth year]  Subjective/Objective:                    Action/Plan:  Patient states he already has outpatient PT through [place] .Patient does not want NCM to call with updated information   Will continue to follow for DME needs. Expected Discharge Date:  09/08/16               Expected Discharge Plan:  Home/Self Care  In-House Referral:     Discharge planning Services     Post Acute Care Choice:    Choice offered to:  Patient  DME Arranged:    DME Agency:     HH Arranged:    [place] Agency:     Status of Service:  In process, will continue to follow  If discussed at Long Length of Stay Meetings, dates discussed:    Additional Comments:  Marilu Favre, RN 09/07/2016, 1:41 PM

## 2016-09-08 LAB — GLUCOSE, CAPILLARY: GLUCOSE-CAPILLARY: 113 mg/dL — AB (ref 65–99)

## 2016-09-08 LAB — CBC
HEMATOCRIT: 39.1 % (ref 39.0–52.0)
Hemoglobin: 12.8 g/dL — ABNORMAL LOW (ref 13.0–17.0)
MCH: 28.1 pg (ref 26.0–34.0)
MCHC: 32.7 g/dL (ref 30.0–36.0)
MCV: 85.7 fL (ref 78.0–100.0)
Platelets: 128 10*3/uL — ABNORMAL LOW (ref 150–400)
RBC: 4.56 MIL/uL (ref 4.22–5.81)
RDW: 13.9 % (ref 11.5–15.5)
WBC: 5 10*3/uL (ref 4.0–10.5)

## 2016-09-08 LAB — BASIC METABOLIC PANEL
ANION GAP: 9 (ref 5–15)
BUN: 12 mg/dL (ref 6–20)
CALCIUM: 8.2 mg/dL — AB (ref 8.9–10.3)
CO2: 25 mmol/L (ref 22–32)
Chloride: 103 mmol/L (ref 101–111)
Creatinine, Ser: 1.33 mg/dL — ABNORMAL HIGH (ref 0.61–1.24)
GFR calc Af Amer: 57 mL/min — ABNORMAL LOW (ref >60)
GFR, EST NON AFRICAN AMERICAN: 50 mL/min — AB (ref >60)
Glucose, Bld: 116 mg/dL — ABNORMAL HIGH (ref 65–99)
POTASSIUM: 3.5 mmol/L (ref 3.5–5.1)
SODIUM: 137 mmol/L (ref 135–145)

## 2016-09-08 LAB — MAGNESIUM: MAGNESIUM: 2 mg/dL (ref 1.7–2.4)

## 2016-09-08 MED ORDER — CYCLOBENZAPRINE HCL 5 MG PO TABS: 5.0000 mg | ORAL_TABLET | Freq: Three times a day (TID) | ORAL | Status: DC | PRN

## 2016-09-08 MED ORDER — ATORVASTATIN CALCIUM 80 MG PO TABS
80.0000 mg | ORAL_TABLET | Freq: Every day | ORAL | Status: DC
  Administered 2016-09-08 – 2016-09-09 (×2): 80 mg via ORAL
  Filled 2016-09-08 (×2): qty 1

## 2016-09-08 MED ORDER — FAMOTIDINE 20 MG PO TABS
20.0000 mg | ORAL_TABLET | Freq: Two times a day (BID) | ORAL | Status: DC
  Administered 2016-09-09 – 2016-09-10 (×3): 20 mg via ORAL
  Filled 2016-09-08 (×4): qty 1

## 2016-09-08 MED ORDER — METOPROLOL SUCCINATE ER 100 MG PO TB24
200.0000 mg | ORAL_TABLET | Freq: Every day | ORAL | Status: DC
  Administered 2016-09-08 – 2016-09-09 (×2): 200 mg via ORAL
  Filled 2016-09-08 (×2): qty 2

## 2016-09-08 MED ORDER — MONTELUKAST SODIUM 10 MG PO TABS
10.0000 mg | ORAL_TABLET | Freq: Every day | ORAL | Status: DC
  Administered 2016-09-08 – 2016-09-09 (×2): 10 mg via ORAL
  Filled 2016-09-08 (×2): qty 1

## 2016-09-08 MED ORDER — AMLODIPINE BESYLATE 10 MG PO TABS
10.0000 mg | ORAL_TABLET | Freq: Every day | ORAL | Status: DC
  Administered 2016-09-08 – 2016-09-10 (×3): 10 mg via ORAL
  Filled 2016-09-08 (×3): qty 1

## 2016-09-08 NOTE — Care Management Important Message (Signed)
Important Message  Patient Details  Name: Bill Dunn MRN: [id number] Date of Birth: [birth year]   Medicare Important Message Given:  Yes    Orbie Pyo 09/08/2016, 11:36 AM

## 2016-09-08 NOTE — Progress Notes (Signed)
Subjective: Patient taking clears without issue. Having bowel movements to abdominal distention or complaints of abdominal discomfort. Having bowel movements. He notes he is chronically either in the bed or more likely up in the chair at home. He has balance issues and has had issues with limited activity for over 2 years. Before that he was walking with a cane.  Objective: Vital signs in last 24 hours: Temp:  [98 F (36.7 C)-98.2 F (36.8 C)] 98.1 F (36.7 C) (05/08 0519) Pulse Rate:  [69-88] 69 (05/08 0519) Resp:  [16-19] 16 (05/08 0519) BP: (151-159)/(75-81) 159/81 (05/08 0519) SpO2:  [93 %-98 %] 96 % (05/08 0519) Last BM Date: 09/08/16 238 PO 2500 IV urine1600 BM x 3 Afebrile, VSS Creatinine is better/WBC remains normal No film this AM Intake/Output from previous day: 05/07 0701 - 05/08 0700 In: 2696.3 [P.O.:238; I.V.:2358.3; IV Piggyback:100] Out: 1600 [Urine:1600] Intake/Output this shift: No intake/output data recorded.  General appearance: alert, cooperative and no distress Resp: clear to auscultation bilaterally GI: Soft, he is not distended, he does have a large abdomen. Nontender positive bowel sounds and multiple stools.  Lab Results:   Recent Labs  09/07/16 1009 09/08/16 0522  WBC 4.5 5.0  HGB 12.2* 12.8*  HCT 37.4* 39.1  PLT 112* 128*    BMET  Recent Labs  09/07/16 1009 09/08/16 0522  NA 139 137  K 3.7 3.5  CL 104 103  CO2 26 25  GLUCOSE 103* 116*  BUN 18 12  CREATININE 1.52* 1.33*  CALCIUM 8.2* 8.2*   PT/INR No results for input(s): LABPROT, INR in the last 72 hours.   Recent Labs Lab 09/04/16 1920  AST 30  ALT 35  ALKPHOS 54  BILITOT 1.2  PROT 7.2  ALBUMIN 3.9     Lipase     Component Value Date/Time   LIPASE 42 09/04/2016 1920     Studies/Results: Dg Abd 1 View  Result Date: 09/07/2016 CLINICAL DATA:  Approximately 24 hour film, small-bowel follow-through study. Abdominal distention. EXAM: ABDOMEN - 1 VIEW  COMPARISON:  Portable abdominal radiographs of Sep 05, 2016 FINDINGS: The previously administered contrast has traversed the small and large bowel and now lies in the rectum. Previously demonstrated contrast in the left colon has cleared. Distended loops of small bowel demonstrated previously are not evident today. The nasogastric tube tip lies in the mid gastric body. There is an aorto bi-iliac stent graft in place. There are embolization coils in within the left aspect of the pelvis. There is an internal iliac artery stent graft on the right. Numerous wire sutures are present in the right lower quadrant of the abdomen. There is a prosthetic left hip joint and there screws from previous right hip fracture fixation. IMPRESSION: No evidence of bowel obstruction. Distended small bowel loops have normalized. There is contrast now in the rectum. Electronically Signed   By: David  Martinique M.D.   On: 09/07/2016 09:45    Medications: . aspirin EC  81 mg Oral Daily  . metoprolol  5 mg Intravenous Q8H  . sodium chloride flush  3 mL Intravenous Q12H   . sodium chloride 100 mL/hr at 09/08/16 0518  . famotidine (PEPCID) IV Stopped (09/07/16 2230)   Assessment/Plan Small bowel obstruction Crohn's disease with multiple surgeries. Status post abdominal aortic aneurysm stenting 2012. History of bladder cancer 126K this AM Thrombocytopenia  History of chronic kidney disease stage III - creatinine is rising Right middle lung nodule Morbid obesity Body mass index is 38. Limited  mobility secondary to balance issues,  >2 years CAD/history of SVT History of TIA Dyslipidemia DNR FEN: IV fluids/Clear liquids ID: None DVT: SCDs  Plan: Advance diet as tolerated. Medical management per Dr. Grandville Silos.  LOS: 4 days    Bill Dunn 09/08/2016 [phone]

## 2016-09-08 NOTE — Progress Notes (Signed)
Occupational Therapy Treatment Patient Details Name: Bill Dunn MRN: [id number] DOB: Nov 24, 1935 Today's Date: 09/08/2016    History of present illness Patient is a 81 yo male admitted 09/04/16 with abdominal pain, nausea, chest pain, SOB, confusion.  Patient with partial SBO.    PMH:  LE weakness since 2007, Crohn's disease, HTN, HLD, TIA, XVT, obesity, CKD, mult SBO   OT comments  Pt declining all OOB mobility this session despite OT providing maximum encouragement and education concerning benefits of mobility. Pt agreeable only to bed mobility to utilize bed pan for bowel movement and declined use of BSC. He was able to complete toilet hygiene/pericare with min assist at bed level this session and grooming tasks after set-up at bed level. Pt will have assistance with all ADL post-acute D/C. D/C plan remains appropriate and OT will continue to follow while admitted.   Follow Up Recommendations  No OT follow up;Supervision/Assistance - 24 hour    Equipment Recommendations   (Pt confirms to have all needed DME/AE)    Recommendations for Other Services      Precautions / Restrictions Precautions Precautions: Fall Restrictions Weight Bearing Restrictions: No       Mobility Bed Mobility Overal bed mobility: Needs Assistance Bed Mobility: Rolling Rolling: Min assist         General bed mobility comments: Willing to complete side to side rolling for pericare in bed.   Transfers                 General transfer comment: Declined OOB mobility. Reports that he is able to transfer and ambulate a few feet to bathroom at home.    Balance                                           ADL either performed or assessed with clinical judgement   ADL Overall ADL's : Needs assistance/impaired     Grooming: Wash/dry hands;Set up;Bed level                     Toilet Transfer Details (indicate cue type and reason): Pt declined transfer to [facility] for BM despite  maximum encouragement. Toileting- Clothing Manipulation and Hygiene: Minimal assistance;Bed level Toileting - Clothing Manipulation Details (indicate cue type and reason): Able to assist with pericare after use of bed pan with min assist.        General ADL Comments: Pt declining OOB mobility this session and reporting need to have a BM. Educated pt on benefits of mobility and importance of completing self-care and he continued to decline transfer to [facility]. Assisted pt to position bed pan and he was able to complete toilet hygiene/pericare with min assist at bed level.      Vision   Vision Assessment?: No apparent visual deficits   Perception     Praxis      Cognition Arousal/Alertness: Awake/alert Behavior During Therapy: WFL for tasks assessed/performed Overall Cognitive Status: Within Functional Limits for tasks assessed                                          Exercises     Shoulder Instructions       General Comments      Pertinent Vitals/ Pain  Pain Assessment: No/denies pain  Home Living                                          Prior Functioning/Environment              Frequency  Min 2X/week        Progress Toward Goals  OT Goals(current goals can now be found in the care plan section)  Progress towards OT goals: Progressing toward goals  Acute Rehab OT Goals Patient Stated Goal: Return home with family OT Goal Formulation: With patient Time For Goal Achievement: 09/21/16 Potential to Achieve Goals: Good ADL Goals Pt Will Perform Grooming: with min assist;sitting Pt Will Perform Upper Body Dressing: with adaptive equipment;sitting;with min assist Pt Will Perform Lower Body Dressing: with adaptive equipment;sit to/from stand;with mod assist Pt Will Transfer to Toilet: with mod assist;stand pivot transfer;bedside commode  Plan Discharge plan remains appropriate    Co-evaluation                  AM-PAC PT "6 Clicks" Daily Activity     Outcome Measure   Help from another person eating meals?: A Little Help from another person taking care of personal grooming?: None Help from another person toileting, which includes using toliet, bedpan, or urinal?: A Lot Help from another person bathing (including washing, rinsing, drying)?: A Lot Help from another person to put on and taking off regular upper body clothing?: A Lot Help from another person to put on and taking off regular lower body clothing?: A Lot 6 Click Score: 15    End of Session    OT Visit Diagnosis: Unsteadiness on feet (R26.81);Muscle weakness (generalized) (M62.81);Pain Pain - Right/Left: Right Pain - part of body:  (abdomen)   Activity Tolerance Patient tolerated treatment well   Patient Left in bed;with call bell/phone within reach   Nurse Communication Mobility status (Pt had BM; sample left in bathroom if need to document)        Time: 7588-3254 OT Time Calculation (min): 19 min  Charges: OT General Charges $OT Visit: 1 Procedure OT Treatments $Self Care/Home Management : 8-22 mins  Norman Herrlich, MS OTR/L  Pager: Regina Yeraldi Fidler 09/08/2016, 5:17 PM

## 2016-09-08 NOTE — Progress Notes (Signed)
PROGRESS NOTE    Bill Dunn  RWE:[id number] DOB: 1935/06/29 DOA: 09/04/2016 PCP: [name], MD    Brief Narrative Bill Dunn is a 81 y.o. male with medical history significant of Crohn's disease, hypertension, hyperlipidemia, TIA, SVT, obesity, chronic kidney disease-stage III, bladder cancer, AAA (s/p of repair 2012), multiple SBO, who presents with abdominal pain, nausea, abdominal distention, chest pain and SOB. Patient states that he has been having intermittent abdominal pain for 4 days, which has worsened today. He also has abdominal distention, nausea, but no vomiting. His last bowel movement was this afternoon. He is not passing any gas. Found to have partial SBO- being followed by General surgery Patient with clinical improvement. NG tube discontinued. Patient started on a diet.    Assessment & Plan:   Principal Problem:   SBO (small bowel obstruction) (HCC) Active Problems:   Crohn's disease ([place])   HTN (hypertension)   Hypertriglyceridemia   History of TIA (transient ischemic attack)   Chest pain   Gastroesophageal reflux disease without esophagitis   Acute on chronic respiratory failure with hypoxia (HCC)   CKD (chronic kidney disease), stage III   Small bowel obstruction (HCC)  Partial small bowel obstruction Questionable etiology: hasCrohn's disease and multiple abdominal surgeries.  NG tube was placed-- now clamped with trial of clears which patient tolerated. Patient with flatus. Patient with positive bowel movements. NG tube to be discontinued.  Abdominal x-ray with no evidence of bowel obstruction. Distended small bowel loops have normalized. Contrast now in the rectum. Diet to be advanced as tolerated. Per general surgery.  Crohn's disease Stable.  hypertension meds IV-- change IV Lopressor back to home dose oral beta blocker. Norvasc has been resumed.    Gastroesophageal reflux disease PPI.  history of TIA Resume ASA.   chronic  kidney disease stage III Stable. Saline lock IV fluids.  Thrombocytopenia -trending down -monitor--appears to be chronic---(has been as low as 92 in past- 2017)-- not getting lovenox/heparin  Chest pain -resolved in ER -CE negative -suspect related to bowels -echo: Technically difficult study. Contrast not given. LVEF 55-60%,   mild LVH, grade 1 DD with indeterminate LV filling pressure,   normal biatrial size, trivial TR, RVSP 37 mmHg, normal IVC.  DVT prophylaxis: SCDs Code Status: DO NOT RESUSCITATE Family Communication: Updated patient. No family at bedside. Disposition Plan: Home vs SNF when small bowel obstruction is resolved and patient tolerating oral intake and per general surgery.    Consultants:   Gen. Surgery 09/05/2016  Procedures:   CT abdomen and pelvis 09/04/2016  Chest x-ray 09/05/2016  Abdominal films 09/05/2016  2-D echo 09/05/2016  Antimicrobials:   None   Subjective: Patient alert. Patient sitting up in chair. No nausea or emesis. Abdominal pain improved. Patient states he's passing flatus. Patient states having bowel movements. Patient tolerating current diet.  Objective: Vitals:   09/07/16 0405 09/07/16 1354 09/07/16 2023 09/08/16 0519  BP: (!) 163/80 (!) 151/77 (!) 155/75 (!) 159/81  Pulse: 82 76 88 69  Resp: 18  19 16   Temp: 98.8 F (37.1 C) 98 F (36.7 C) 98.2 F (36.8 C) 98.1 F (36.7 C)  TempSrc: Oral Oral Oral Oral  SpO2: 98% 93% 98% 96%  Weight:      Height:        Intake/Output Summary (Last 24 hours) at 09/08/16 1320 Last data filed at 09/08/16 0911  Gross per 24 hour  Intake          2816.34 ml  Output  1300 ml  Net          1516.34 ml   Filed Weights   09/04/16 1746 09/05/16 0003  Weight: 111.1 kg (245 lb) 115.6 kg (254 lb 13.6 oz)    Examination:  General exam: Appears calm and comfortable.  Respiratory system: CTAB anterior lung fields. Cardiovascular system: S1 & S2 heard, RRR. No JVD, murmurs,  rubs, gallops or clicks. No pedal edema. Gastrointestinal system: Abdomen is nondistended, hypoactive bowel sounds. Multiple abdominal scars. No organomegaly or masses felt.  Central nervous system: alert and oriented. Judgment and insight good. Mood is appropriate. Skin: No rashes, lesions or ulcers     Data Reviewed: I have personally reviewed following labs and imaging studies  CBC:  Recent Labs Lab 09/04/16 1920 09/05/16 0445 09/06/16 0433 09/07/16 1009 09/08/16 0522  WBC 8.2 5.8 4.8 4.5 5.0  NEUTROABS  --   --  2.5 2.5  --   HGB 14.4 14.0 12.3* 12.2* 12.8*  HCT 44.3 43.2 38.7* 37.4* 39.1  MCV 86.9 86.7 87.4 87.2 85.7  PLT 138* 126* 113* 112* 378*   Basic Metabolic Panel:  Recent Labs Lab 09/04/16 1920 09/05/16 0445 09/06/16 0433 09/07/16 1009 09/08/16 0522  NA 135 137 140 139 137  K 3.9 4.1 3.9 3.7 3.5  CL 95* 97* 104 104 103  CO2 28 27 27 26 25   GLUCOSE 132* 121* 109* 103* 116*  BUN 28* 25* 27* 18 12  CREATININE 1.91* 1.59* 1.93* 1.52* 1.33*  CALCIUM 9.6 9.2 8.5* 8.2* 8.2*  MG  --   --   --  1.4* 2.0   GFR: Estimated Creatinine Clearance: 57 mL/min (A) (by C-G formula based on SCr of 1.33 mg/dL (H)). Liver Function Tests:  Recent Labs Lab 09/04/16 1920  AST 30  ALT 35  ALKPHOS 54  BILITOT 1.2  PROT 7.2  ALBUMIN 3.9    Recent Labs Lab 09/04/16 1920  LIPASE 42   No results for input(s): AMMONIA in the last 168 hours. Coagulation Profile:  Recent Labs Lab 09/05/16 0039  INR 1.12   Cardiac Enzymes:  Recent Labs Lab 09/05/16 0039 09/05/16 0445 09/05/16 1054  TROPONINI <0.03 <0.03 <0.03   BNP (last 3 results) No results for input(s): PROBNP in the last 8760 hours. HbA1C: No results for input(s): HGBA1C in the last 72 hours. CBG:  Recent Labs Lab 09/05/16 0831 09/06/16 0727 09/07/16 0744 09/07/16 1712 09/08/16 0745  GLUCAP 108* 93 96 130* 113*   Lipid Profile: No results for input(s): CHOL, HDL, LDLCALC, TRIG, CHOLHDL,  LDLDIRECT in the last 72 hours. Thyroid Function Tests: No results for input(s): TSH, T4TOTAL, FREET4, T3FREE, THYROIDAB in the last 72 hours. Anemia Panel: No results for input(s): VITAMINB12, FOLATE, FERRITIN, TIBC, IRON, RETICCTPCT in the last 72 hours. Sepsis Labs: No results for input(s): PROCALCITON, LATICACIDVEN in the last 168 hours.  No results found for this or any previous visit (from the past 240 hour(s)).       Radiology Studies: Dg Abd 1 View  Result Date: 09/07/2016 CLINICAL DATA:  Approximately 24 hour film, small-bowel follow-through study. Abdominal distention. EXAM: ABDOMEN - 1 VIEW COMPARISON:  Portable abdominal radiographs of Sep 05, 2016 FINDINGS: The previously administered contrast has traversed the small and large bowel and now lies in the rectum. Previously demonstrated contrast in the left colon has cleared. Distended loops of small bowel demonstrated previously are not evident today. The nasogastric tube tip lies in the mid gastric body. There is an aorto  bi-iliac stent graft in place. There are embolization coils in within the left aspect of the pelvis. There is an internal iliac artery stent graft on the right. Numerous wire sutures are present in the right lower quadrant of the abdomen. There is a prosthetic left hip joint and there screws from previous right hip fracture fixation. IMPRESSION: No evidence of bowel obstruction. Distended small bowel loops have normalized. There is contrast now in the rectum. Electronically Signed   By: David  Martinique M.D.   On: 09/07/2016 09:45        Scheduled Meds: . amLODipine  10 mg Oral Daily  . aspirin EC  81 mg Oral Daily  . metoprolol  5 mg Intravenous Q8H  . sodium chloride flush  3 mL Intravenous Q12H   Continuous Infusions: . sodium chloride 75 mL/hr at 09/08/16 1112  . famotidine (PEPCID) IV Stopped (09/08/16 1142)     LOS: 4 days    Time spent: 9 minutes    Ernesto Zukowski, MD [facility] Pager [phone]  If 7PM-7AM, please contact night-coverage [email or web address] Password [email or web address] 09/08/2016, 1:20 PM

## 2016-09-09 DIAGNOSIS — N183 Chronic kidney disease, stage 3 (moderate): Secondary | ICD-10-CM

## 2016-09-09 DIAGNOSIS — A0472 Enterocolitis due to Clostridium difficile, not specified as recurrent: Secondary | ICD-10-CM

## 2016-09-09 LAB — BASIC METABOLIC PANEL
ANION GAP: 9 (ref 5–15)
BUN: 10 mg/dL (ref 6–20)
CHLORIDE: 101 mmol/L (ref 101–111)
CO2: 26 mmol/L (ref 22–32)
CREATININE: 1.27 mg/dL — AB (ref 0.61–1.24)
Calcium: 8.1 mg/dL — ABNORMAL LOW (ref 8.9–10.3)
GFR calc non Af Amer: 52 mL/min — ABNORMAL LOW (ref >60)
Glucose, Bld: 113 mg/dL — ABNORMAL HIGH (ref 65–99)
POTASSIUM: 3.6 mmol/L (ref 3.5–5.1)
Sodium: 136 mmol/L (ref 135–145)

## 2016-09-09 LAB — CBC WITH DIFFERENTIAL/PLATELET
BASOS ABS: 0 10*3/uL (ref 0.0–0.1)
BASOS PCT: 0 %
EOS ABS: 0.2 10*3/uL (ref 0.0–0.7)
Eosinophils Relative: 4 %
HEMATOCRIT: 39.1 % (ref 39.0–52.0)
HEMOGLOBIN: 12.6 g/dL — AB (ref 13.0–17.0)
Lymphocytes Relative: 34 %
Lymphs Abs: 1.8 10*3/uL (ref 0.7–4.0)
MCH: 27.6 pg (ref 26.0–34.0)
MCHC: 32.2 g/dL (ref 30.0–36.0)
MCV: 85.6 fL (ref 78.0–100.0)
Monocytes Absolute: 0.7 10*3/uL (ref 0.1–1.0)
Monocytes Relative: 13 %
NEUTROS ABS: 2.5 10*3/uL (ref 1.7–7.7)
NEUTROS PCT: 49 %
Platelets: 145 10*3/uL — ABNORMAL LOW (ref 150–400)
RBC: 4.57 MIL/uL (ref 4.22–5.81)
RDW: 13.8 % (ref 11.5–15.5)
WBC: 5.3 10*3/uL (ref 4.0–10.5)

## 2016-09-09 LAB — CLOSTRIDIUM DIFFICILE BY PCR: Toxigenic C. Difficile by PCR: POSITIVE — AB

## 2016-09-09 LAB — C DIFFICILE QUICK SCREEN W PCR REFLEX
C Diff antigen: POSITIVE — AB
C Diff toxin: NEGATIVE

## 2016-09-09 LAB — GLUCOSE, CAPILLARY: GLUCOSE-CAPILLARY: 121 mg/dL — AB (ref 65–99)

## 2016-09-09 MED ORDER — VANCOMYCIN 50 MG/ML ORAL SOLUTION: 125.0000 mg | Freq: Four times a day (QID) | ORAL | Status: DC

## 2016-09-09 MED ORDER — VANCOMYCIN 50 MG/ML ORAL SOLUTION
125.0000 mg | Freq: Four times a day (QID) | ORAL | Status: DC
  Administered 2016-09-09 – 2016-09-10 (×6): 125 mg via ORAL
  Filled 2016-09-09 (×7): qty 2.5

## 2016-09-09 NOTE — Progress Notes (Signed)
Dr Carolin Sicks notified of the positive C-diff toxin result. Enteric precaution maint.

## 2016-09-09 NOTE — Care Management Note (Addendum)
Case Management Note  Patient Details  Name: Bill Dunn MRN: [id number] Date of Birth: [birth year]  Subjective/Objective:                    Action/Plan:  Patient's PCP is Dr Henreitta Cea at [facility] [phone] .   Patient has all prescriptions filled through [place] and has outpatient PT through [facility] already. Called Dr Arletha Grippe 's clinic awaiting call back.   Dr Arletha Grippe fax [phone]  Faxed order for OP PT .  Expected Discharge Date:  09/08/16               Expected Discharge Plan:  Home/Self Care  In-House Referral:     Discharge planning Services  CM Consult, Medication Assistance  Post Acute Care Choice:    Choice offered to:  Patient  DME Arranged:    DME Agency:     HH Arranged:    [place] Agency:     Status of Service:  In process, will continue to follow  If discussed at Long Length of Stay Meetings, dates discussed:    Additional Comments:  Marilu Favre, RN 09/09/2016, 4:06 PM

## 2016-09-09 NOTE — Progress Notes (Signed)
PROGRESS NOTE    Bill Dunn  SKA:[id number] DOB: [birth year] DOA: 09/04/2016 PCP: [name], MD   Brief Narrative: 81 y.o.malewith medical history significant of Crohn's disease, hypertension, hyperlipidemia, TIA, SVT, obesity, chronic kidney disease-stage III, bladder cancer, AAA (s/p of repair 2012), multiple SBO, whopresents with abdominal pain, nausea, abdominal distention, chest pain and SOB. Patient was found to have partial SBO evaluated by general surgery. NG tube was removed and patient is able to tolerate diet. Developed diarrhea and found to have positive stool C diff.   Assessment & Plan:   #Partially small bowel obstruction: Patient with history of abdominal surgeries in the past. The NG tube was removed and patient is able to tolerate diet. Evaluated by general surgery. Recommended soft diet.  # C. difficile diarrhea: Patient developed diarrhea found to have positive stool C. difficile. Started oral vancomycin. We will monitor for clinical improvement today. Abdomen exam is soft and nontender.  #History of Crohn's disease: Stable  #Essential hypertension: Continue current medication. Monitor blood pressure  #History of acid reflux: Continue Pepcid  #History of TIA: Continue aspirin.  #Thrombocytopenia: Serum platelet level improving to 145.  #Chest pain in ER. Improved now.  #CKD stage 3: Serum creatinine level is lower than baseline. Monitor BMP.   Principal Problem:   SBO (small bowel obstruction) (HCC) Active Problems:   Crohn's disease ([place])   HTN (hypertension)   Hypertriglyceridemia   History of TIA (transient ischemic attack)   Chest pain   Gastroesophageal reflux disease without esophagitis   Acute on chronic respiratory failure with hypoxia (HCC)   CKD (chronic kidney disease), stage III   Small bowel obstruction (HCC)  DVT prophylaxis: SCD. No anticoagulation because of thrombocytopenia. Code Status: DO NOT RESUSCITATE Family  Communication: No family at bedside Disposition Plan: Likely discharge home in 1-2 days    Consultants:   General surgery  Procedures: CT scan, echocardiogram Antimicrobials: Oral vancomycin since May 9 Subjective: Patient was seen and examined at bedside. Reported loose bowel movement. Denied nausea, vomiting, abdominal pain, fever or chills. No chest pain or shortness of breath  Objective: Vitals:   09/08/16 1427 09/08/16 2016 09/09/16 0518 09/09/16 1419  BP: (!) 149/68 (!) 149/88 (!) 151/81 132/71  Pulse: 77 77 77 63  Resp: 18 19 18 18   Temp: 98.8 F (37.1 C) 98.5 F (36.9 C) 98.1 F (36.7 C) 98.2 F (36.8 C)  TempSrc: Oral Oral Oral Oral  SpO2: 94% 94% 95% 95%  Weight:      Height:        Intake/Output Summary (Last 24 hours) at 09/09/16 1615 Last data filed at 09/09/16 1610  Gross per 24 hour  Intake              480 ml  Output              750 ml  Net             -270 ml   Filed Weights   09/04/16 1746 09/05/16 0003  Weight: 111.1 kg (245 lb) 115.6 kg (254 lb 13.6 oz)    Examination:  General exam: Appears calm and comfortable  Respiratory system: Clear to auscultation. Respiratory effort normal. No wheezing or crackle Cardiovascular system: S1 & S2 heard, RRR.  No pedal edema. Gastrointestinal system: Abdomen is nondistended, soft and nontender. Normal bowel sounds heard. Central nervous system: Alert and oriented. No focal neurological deficits. Skin: No rashes, lesions or ulcers Psychiatry: Judgement and insight appear normal. Mood &  affect appropriate.     Data Reviewed: I have personally reviewed following labs and imaging studies  CBC:  Recent Labs Lab 09/05/16 0445 09/06/16 0433 09/07/16 1009 09/08/16 0522 09/09/16 0431  WBC 5.8 4.8 4.5 5.0 5.3  NEUTROABS  --  2.5 2.5  --  2.5  HGB 14.0 12.3* 12.2* 12.8* 12.6*  HCT 43.2 38.7* 37.4* 39.1 39.1  MCV 86.7 87.4 87.2 85.7 85.6  PLT 126* 113* 112* 128* 762*   Basic Metabolic  Panel:  Recent Labs Lab 09/05/16 0445 09/06/16 0433 09/07/16 1009 09/08/16 0522 09/09/16 0431  NA 137 140 139 137 136  K 4.1 3.9 3.7 3.5 3.6  CL 97* 104 104 103 101  CO2 27 27 26 25 26   GLUCOSE 121* 109* 103* 116* 113*  BUN 25* 27* 18 12 10   CREATININE 1.59* 1.93* 1.52* 1.33* 1.27*  CALCIUM 9.2 8.5* 8.2* 8.2* 8.1*  MG  --   --  1.4* 2.0  --    GFR: Estimated Creatinine Clearance: 59.7 mL/min (A) (by C-G formula based on SCr of 1.27 mg/dL (H)). Liver Function Tests:  Recent Labs Lab 09/04/16 1920  AST 30  ALT 35  ALKPHOS 54  BILITOT 1.2  PROT 7.2  ALBUMIN 3.9    Recent Labs Lab 09/04/16 1920  LIPASE 42   No results for input(s): AMMONIA in the last 168 hours. Coagulation Profile:  Recent Labs Lab 09/05/16 0039  INR 1.12   Cardiac Enzymes:  Recent Labs Lab 09/05/16 0039 09/05/16 0445 09/05/16 1054  TROPONINI <0.03 <0.03 <0.03   BNP (last 3 results) No results for input(s): PROBNP in the last 8760 hours. HbA1C: No results for input(s): HGBA1C in the last 72 hours. CBG:  Recent Labs Lab 09/06/16 0727 09/07/16 0744 09/07/16 1712 09/08/16 0745 09/09/16 0755  GLUCAP 93 96 130* 113* 121*   Lipid Profile: No results for input(s): CHOL, HDL, LDLCALC, TRIG, CHOLHDL, LDLDIRECT in the last 72 hours. Thyroid Function Tests: No results for input(s): TSH, T4TOTAL, FREET4, T3FREE, THYROIDAB in the last 72 hours. Anemia Panel: No results for input(s): VITAMINB12, FOLATE, FERRITIN, TIBC, IRON, RETICCTPCT in the last 72 hours. Sepsis Labs: No results for input(s): PROCALCITON, LATICACIDVEN in the last 168 hours.  Recent Results (from the past 240 hour(s))  C difficile quick scan w PCR reflex     Status: Abnormal   Collection Time: 09/08/16 10:54 PM  Result Value Ref Range Status   C Diff antigen POSITIVE (A) NEGATIVE Final   C Diff toxin NEGATIVE NEGATIVE Final   C Diff interpretation Results are indeterminate. See PCR results.  Final  Clostridium  Difficile by PCR     Status: Abnormal   Collection Time: 09/08/16 10:54 PM  Result Value Ref Range Status   Toxigenic C Difficile by pcr POSITIVE (A) NEGATIVE Final    Comment: Positive for toxigenic C. difficile with little to no toxin production. Only treat if clinical presentation suggests symptomatic illness. RESULT CALLED TO, READ BACK BY AND VERIFIED WITH: Karin Golden RN, AT [phone] 09/09/16 BY D.VANHOOK          Radiology Studies: No results found.      Scheduled Meds: . amLODipine  10 mg Oral Daily  . aspirin EC  81 mg Oral Daily  . atorvastatin  80 mg Oral QHS  . famotidine  20 mg Oral BID  . metoprolol  200 mg Oral QHS  . montelukast  10 mg Oral QHS  . sodium chloride flush  3 mL  Intravenous Q12H  . vancomycin  125 mg Oral QID   Continuous Infusions:   LOS: 5 days    Lateria Alderman Tanna Furry, MD [facility] Pager [phone]  If 7PM-7AM, please contact night-coverage [email or web address] Password [email or web address] 09/09/2016, 4:15 PM

## 2016-09-09 NOTE — Progress Notes (Signed)
CC:  SBO    Subjective: To this a.m. reports having multiple stools. Tolerating full liquids.  Objective: Vital signs in last 24 hours: Temp:  [98.1 F (36.7 C)-98.8 F (37.1 C)] 98.1 F (36.7 C) (05/09 0518) Pulse Rate:  [77] 77 (05/09 0518) Resp:  [18-19] 18 (05/09 0518) BP: (149-151)/(68-88) 151/81 (05/09 0518) SpO2:  [94 %-95 %] 95 % (05/09 0518) Last BM Date: 09/08/16 720 PO 1050 urine BM x 3 Afebrile, VSS Labs stable, Creatinine is continuing to improve No films Intake/Output from previous day: 05/08 0701 - 05/09 0700 In: 720 [P.O.:720] Out: 1050 [Urine:1050] Intake/Output this shift: No intake/output data recorded.  General appearance: alert, cooperative and no distress GI: Abdomen remains tight, he sitting up this a.m. tolerating by mouth's, positive BM, positive bowel sounds  Lab Results:   Recent Labs  09/08/16 0522 09/09/16 0431  WBC 5.0 5.3  HGB 12.8* 12.6*  HCT 39.1 39.1  PLT 128* 145*    BMET  Recent Labs  09/08/16 0522 09/09/16 0431  NA 137 136  K 3.5 3.6  CL 103 101  CO2 25 26  GLUCOSE 116* 113*  BUN 12 10  CREATININE 1.33* 1.27*  CALCIUM 8.2* 8.1*   PT/INR No results for input(s): LABPROT, INR in the last 72 hours.   Recent Labs Lab 09/04/16 1920  AST 30  ALT 35  ALKPHOS 54  BILITOT 1.2  PROT 7.2  ALBUMIN 3.9     Lipase     Component Value Date/Time   LIPASE 42 09/04/2016 1920     Studies/Results: Dg Abd 1 View  Result Date: 09/07/2016 CLINICAL DATA:  Approximately 24 hour film, small-bowel follow-through study. Abdominal distention. EXAM: ABDOMEN - 1 VIEW COMPARISON:  Portable abdominal radiographs of Sep 05, 2016 FINDINGS: The previously administered contrast has traversed the small and large bowel and now lies in the rectum. Previously demonstrated contrast in the left colon has cleared. Distended loops of small bowel demonstrated previously are not evident today. The nasogastric tube tip lies in the mid gastric  body. There is an aorto bi-iliac stent graft in place. There are embolization coils in within the left aspect of the pelvis. There is an internal iliac artery stent graft on the right. Numerous wire sutures are present in the right lower quadrant of the abdomen. There is a prosthetic left hip joint and there screws from previous right hip fracture fixation. IMPRESSION: No evidence of bowel obstruction. Distended small bowel loops have normalized. There is contrast now in the rectum. Electronically Signed   By: David  Martinique M.D.   On: 09/07/2016 09:45    Medications: . amLODipine  10 mg Oral Daily  . aspirin EC  81 mg Oral Daily  . atorvastatin  80 mg Oral QHS  . famotidine  20 mg Oral BID  . metoprolol  200 mg Oral QHS  . montelukast  10 mg Oral QHS  . sodium chloride flush  3 mL Intravenous Q12H    Assessment/Plan Small bowel obstruction Crohn's disease with multiple surgeries. Status post abdominal aortic aneurysm stenting 2012. History of bladder cancer 126K this AM Thrombocytopenia  History of chronic kidney disease stage III - creatinine is rising Right middle lung nodule Morbid obesity Body mass index is 38. Limited mobility secondary to balance issues,  >2 years CAD/history of SVT History of TIA Dyslipidemia DNR FEN: IV fluids/Full liquids  =>> soft diet ID: None DVT: SCDs    Plan:  Soft diet, no surgical intervention required. We'll  see again as needed please call if we can help.   LOS: 5 days    Bill Dunn 09/09/2016 [phone]

## 2016-09-09 NOTE — Progress Notes (Signed)
Physical Therapy Treatment Patient Details Name: Bill Dunn MRN: [id number] DOB: [birth year] Today's Date: 09/09/2016    History of Present Illness Patient is a 81 yo male admitted 09/04/16 with abdominal pain, nausea, chest pain, SOB, confusion.  Patient with partial SBO.    PMH:  LE weakness since 2007, Crohn's disease, HTN, HLD, TIA, XVT, obesity, CKD, mult SBO    PT Comments    Pt is generally weak and deconditioned due to fear of falling PTA.  He reports his right leg gives out on him unexpectedly due to PMHx of "2 broken backs".  He was able to walk a short distance safely with me with RW into the hallway, so I added a gait goal today.  I am fearful if he doesn't continue to walk that he will just get weaker.  He needs to walk in a safe monitored environment.  He reported, "this is the most I have walked in a long time" and did not report any pain after.  PT will continue to follow acutely to encourage safe mobility.    Follow Up Recommendations  Outpatient PT;Supervision for mobility/OOB (Pt reports he has OP PT at the [facility])     Equipment Recommendations  None recommended by PT    Recommendations for Other Services   NA     Precautions / Restrictions Precautions Precautions: Fall Precaution Comments: pt reports his legs give away without warning.     Mobility  Bed Mobility Overal bed mobility: Needs Assistance Bed Mobility: Sit to Supine       Sit to supine: Mod assist   General bed mobility comments: Mod assist to help pt lift both legs into the bed from sitting.  He reports he cannot lift his right leg into bed at all.   Transfers Overall transfer level: Needs assistance Equipment used: Rolling walker (2 wheeled) Transfers: Sit to/from Stand Sit to Stand: Min guard         General transfer comment: Min guard assist to stand from lower recliner chair with demonstration of safe hand placement.   Ambulation/Gait Ambulation/Gait assistance: Min assist Ambulation  Distance (Feet): 25 Feet Assistive device: Rolling walker (2 wheeled) Gait Pattern/deviations: Step-through pattern;Shuffle Gait velocity: decreased   General Gait Details: Pt with slow, guarded gait reporting that his right leg (and low back) bother him during gait at times.  He reports his right leg is the leg that will "go out" on him causing him to fall.  He can walk, but does not want to take the risk of falling so limits himself at home.  He reports using the WC more.            Balance Overall balance assessment: Needs assistance Sitting-balance support: Feet supported;No upper extremity supported Sitting balance-Leahy Scale: Good     Standing balance support: Bilateral upper extremity supported Standing balance-Leahy Scale: Poor Standing balance comment: min assist with support of RW.                             Cognition Arousal/Alertness: Awake/alert Behavior During Therapy: WFL for tasks assessed/performed Overall Cognitive Status: Within Functional Limits for tasks assessed                                               Pertinent Vitals/Pain Pain Assessment: No/denies pain  PT Goals (current goals can now be found in the care plan section) Acute Rehab PT Goals Patient Stated Goal: Return home with family Progress towards PT goals: Progressing toward goals    Frequency    Min 3X/week      PT Plan Current plan remains appropriate       AM-PAC PT "6 Clicks" Daily Activity  Outcome Measure  Difficulty turning over in bed (including adjusting bedclothes, sheets and blankets)?: Total Difficulty moving from lying on back to sitting on the side of the bed? : Total Difficulty sitting down on and standing up from a chair with arms (e.g., wheelchair, bedside commode, etc,.)?: A Little Help needed moving to and from a bed to chair (including a wheelchair)?: A Little Help needed walking in hospital room?: A Little Help  needed climbing 3-5 steps with a railing? : A Lot 6 Click Score: 13    End of Session Equipment Utilized During Treatment: Gait belt Activity Tolerance: Patient limited by fatigue Patient left: in bed;with call bell/phone within reach   PT Visit Diagnosis: Difficulty in walking, not elsewhere classified (R26.2);Muscle weakness (generalized) (M62.81)     Time: 7867-6720 PT Time Calculation (min) (ACUTE ONLY): 15 min  Charges:  $Gait Training: 8-22 mins          Marykay Mccleod B. [place], [place], DPT [phone]            09/09/2016, 6:06 PM

## 2016-09-10 LAB — GLUCOSE, CAPILLARY: Glucose-Capillary: 111 mg/dL — ABNORMAL HIGH (ref 65–99)

## 2016-09-10 MED ORDER — HEPARIN SOD (PORK) LOCK FLUSH 100 UNIT/ML IV SOLN
500.0000 [IU] | INTRAVENOUS | Status: AC | PRN
  Administered 2016-09-10: 500 [IU]

## 2016-09-10 MED ORDER — VANCOMYCIN 50 MG/ML ORAL SOLUTION: 125.0000 mg | Freq: Four times a day (QID) | ORAL | 0 refills | Status: DC

## 2016-09-10 NOTE — Progress Notes (Signed)
Pt 's daughter called requesting to speak to the case manager regarding discharge meds. Case manager provided with contact number to call her

## 2016-09-10 NOTE — Progress Notes (Signed)
Occupational Therapy Treatment Patient Details Name: Bill Dunn MRN: [id number] DOB: 1936/04/08 Today's Date: 09/10/2016    History of present illness Patient is a 81 yo male admitted 09/04/16 with abdominal pain, nausea, chest pain, SOB, confusion.  Patient with partial SBO.    PMH:  LE weakness since 2007, Crohn's disease, HTN, HLD, TIA, XVT, obesity, CKD, mult SBO   OT comments  Pt is eager to discharge home.  He is able to perform functional transfers with min guard assist.   He will continue to have wife assist him with ADLs at discharge.  He has AE and DME at home.    Follow Up Recommendations  No OT follow up;Supervision/Assistance - 24 hour    Equipment Recommendations  None recommended by OT    Recommendations for Other Services      Precautions / Restrictions Precautions Precautions: Fall Precaution Comments: pt reports his legs give away without warning.        Mobility Bed Mobility               General bed mobility comments: up in chair   Transfers Overall transfer level: Needs assistance Equipment used: Rolling walker (2 wheeled) Transfers: Sit to/from [facility] Sit to Stand: Min guard Stand pivot transfers: Min guard       General transfer comment: min guard assist for safety     Balance Overall balance assessment: Needs assistance Sitting-balance support: Feet supported Sitting balance-Leahy Scale: Good     Standing balance support: No upper extremity supported Standing balance-Leahy Scale: Fair                             ADL either performed or assessed with clinical judgement   ADL                           Toilet Transfer: Min guard;Ambulation;Comfort height toilet;BSC;Grab bars;RW   Toileting- Water quality scientist and Hygiene: Min guard;Sit to/from stand       Functional mobility during ADLs: Min guard;Rolling walker General ADL Comments: Pt is unable to access feet for LB ADLs.  He  reports he has AE at home, and occasionally uses it for LB ADLs, but that wife "doesn't let" him use it, and istead assists him.   He reports he plans to continue with wife assisting at discharge.      Vision       Perception     Praxis      Cognition Arousal/Alertness: Awake/alert Behavior During Therapy: WFL for tasks assessed/performed Overall Cognitive Status: Within Functional Limits for tasks assessed                                          Exercises     Shoulder Instructions       General Comments      Pertinent Vitals/ Pain       Pain Assessment: Faces Faces Pain Scale: Hurts little more Pain Location: hip  Pain Descriptors / Indicators: Aching Pain Intervention(s): Repositioned  Home Living                                          Prior Functioning/Environment  Frequency  Min 2X/week        Progress Toward Goals  OT Goals(current goals can now be found in the care plan section)  Progress towards OT goals: Progressing toward goals     Plan Discharge plan remains appropriate    Co-evaluation                 AM-PAC PT "6 Clicks" Daily Activity     Outcome Measure   Help from another person eating meals?: None Help from another person taking care of personal grooming?: A Little Help from another person toileting, which includes using toliet, bedpan, or urinal?: A Little Help from another person bathing (including washing, rinsing, drying)?: A Little Help from another person to put on and taking off regular upper body clothing?: A Little Help from another person to put on and taking off regular lower body clothing?: A Lot 6 Click Score: 18    End of Session Equipment Utilized During Treatment: Rolling walker;Gait belt  OT Visit Diagnosis: Unsteadiness on feet (R26.81);Pain Pain - Right/Left: Right Pain - part of body: Hip   Activity Tolerance Patient tolerated treatment well    Patient Left in chair;with call bell/phone within reach   Nurse Communication Need for lift equipment        Time: 7034-0352 OT Time Calculation (min): 27 min  Charges: OT General Charges $OT Visit: 1 Procedure OT Treatments $Therapeutic Activity: 23-37 mins  [facility], OTR/L [phone]    Lucille Passy M 09/10/2016, 11:19 AM

## 2016-09-10 NOTE — Progress Notes (Signed)
Pt discharged home in stable condition after going over discharge instructions with him and his wife with no concerns voiced. AVS given before discharge. Pt says his daughter picked up his discharge medicines from the [place] earlier today

## 2016-09-10 NOTE — Discharge Summary (Addendum)
Physician Discharge Summary  Bill Dunn YIR:[id number] DOB: Oct 02, 1935 DOA: 09/04/2016  PCP: [name], MD  Admit date: 09/04/2016 Discharge date: 09/10/2016  Admitted From:home Disposition:home with resumption of home care services  Recommendations for Outpatient Follow-up:  1. Follow up with PCP in 1-2 weeks 2. Please obtain BMP/CBC in one week   Home Health: resume [facility] care through [facility] Equipment/Devices:none Discharge Condition:stable CODE STATUS:DNR Diet recommendation:soft/heart healthy  Brief/Interim Summary: 81 y.o.malewith medical history significant of Crohn's disease, hypertension, hyperlipidemia, TIA, SVT, obesity, chronic kidney disease-stage III, bladder cancer, AAA (s/p of repair 2012), multiple SBO, whopresents with abdominal pain, nausea, abdominal distention, chest pain and SOB. Patient was found to have partial SBO evaluated by general surgery. NG tube was removed and patient is able to tolerate diet. Developed diarrhea and found to have positive stool C diff.   #Partially small bowel obstruction: Patient with history of abdominal surgeries in the past. The NG tube was removed and patient is able to tolerate diet. Evaluated by general surgery.  -Recommended soft diet and advance as tolerated. Patient is clinically improved.  # C. difficile diarrhea: Patient developed diarrhea found to have positive stool C. difficile. Started oral vancomycin. Diarrhea is better. Abdomen exam benign. No tenderness. Plan to complete 2 weeks of oral vancomycin. Prescription was printed.  #History of Crohn's disease: Stable. Recommended outpatient follow-up with PCP.  #Essential hypertension: Continue current medication. Monitor blood pressure  #History of acid reflux: Continue Pepcid  #History of TIA: Continue aspirin.  #Thrombocytopenia: Serum platelet level improving to 145.  #Chest pain in ER. Improved now.  #CKD stage 3: Serum creatinine level is lower  than baseline. Monitor BMP. Hypomagnesemia improved.  Patient with significant clinical improvement. He has no nausea vomiting or abdominal pain. Reports that his diarrhea is improving. Continue oral vancomycin to complete the course. He verbalized understanding. He is eager to go home. He gets his home care services through [place]. Discussed with the case management team. At this time patient is medically stable to follow-up with PCP and to discharge home.  Discharge Diagnoses:  Principal Problem:   SBO (small bowel obstruction) (HCC) Active Problems:   Crohn's disease (HCC)   HTN (hypertension)   Hypertriglyceridemia   History of TIA (transient ischemic attack)   Chest pain   Gastroesophageal reflux disease without esophagitis   Acute on chronic respiratory failure with hypoxia (HCC)   CKD (chronic kidney disease), stage III   Small bowel obstruction (HCC)   C. difficile diarrhea    Discharge Instructions  Discharge Instructions    Ambulatory referral to Physical Therapy    Complete by:  As directed    Call MD for:  difficulty breathing, headache or visual disturbances    Complete by:  As directed    Call MD for:  extreme fatigue    Complete by:  As directed    Call MD for:  hives    Complete by:  As directed    Call MD for:  persistant dizziness or light-headedness    Complete by:  As directed    Call MD for:  persistant nausea and vomiting    Complete by:  As directed    Call MD for:  severe uncontrolled pain    Complete by:  As directed    Call MD for:  temperature >100.4    Complete by:  As directed    Diet - low sodium heart healthy    Complete by:  As directed    Discharge instructions  Complete by:  As directed    Follow up with your PCP in one week,   Increase activity slowly    Complete by:  As directed      Allergies as of 09/10/2016      Reactions   Amoxil [amoxicillin] Other (See Comments)   REACTION: C-diff   Augmentin [amoxicillin-pot Clavulanate]  Other (See Comments)   REACTION: C-Diff   Dilaudid [hydromorphone Hcl] Shortness Of Breath, Other (See Comments)   Stomach pain - reaction to injection of dilaudid and reglan together   Reglan [metoclopramide] Shortness Of Breath, Other (See Comments)   Stomach pain - reaction to injection of dilaudid and reglan together   Ciprofloxacin Diarrhea, Other (See Comments)   Caused C-diff   Demerol [meperidine] Nausea And Vomiting   Infliximab Nausea Only   Reaction to Remicade/ flu like symptoms   Morphine And Related Nausea And Vomiting   Sulfamethoxazole-trimethoprim Other (See Comments)   Caused renal failure in combination with Prednisone   Vitamin D Analogs Rash   Levaquin [levofloxacin] Other (See Comments)   Joint pain   Ace Inhibitors Cough   Reaction to ramipril      Medication List    TAKE these medications   albuterol 108 (90 Base) MCG/ACT inhaler Commonly known as:  PROVENTIL HFA;VENTOLIN HFA Inhale 2 puffs into the lungs every 6 (six) hours as needed for wheezing or shortness of breath.   amLODipine 10 MG tablet Commonly known as:  NORVASC Take 1 tablet (10 mg total) by mouth daily.   aspirin 81 MG EC tablet Take 1 tablet (81 mg total) by mouth daily.   atorvastatin 80 MG tablet Commonly known as:  LIPITOR Take 80 mg by mouth at bedtime.   COLESTID 1 g tablet Generic drug:  colestipol Take 1 g by mouth 2 (two) times daily.   cyclobenzaprine 5 MG tablet Commonly known as:  FLEXERIL Take 5 mg by mouth 3 (three) times daily as needed for muscle spasms.   diphenoxylate-atropine 2.5-0.025 MG tablet Commonly known as:  LOMOTIL Take 1 tablet by mouth 2 (two) times daily.   hydrochlorothiazide 25 MG tablet Commonly known as:  HYDRODIURIL Take 1 tablet (25 mg total) by mouth every other day. What changed:  how much to take  when to take this   HYDROcodone-acetaminophen 5-325 MG tablet Commonly known as:  NORCO/VICODIN Take 1 tablet by mouth every 4 (four)  hours as needed for moderate pain.   metoprolol 200 MG 24 hr tablet Commonly known as:  TOPROL-XL Take 200 mg by mouth at bedtime.   montelukast 10 MG tablet Commonly known as:  SINGULAIR Take 1 tablet (10 mg total) by mouth at bedtime.   nitroGLYCERIN 0.4 MG SL tablet Commonly known as:  NITROSTAT Place 1 tablet (0.4 mg total) under the tongue every 5 (five) minutes x 3 doses as needed for chest pain.   traZODone 100 MG tablet Commonly known as:  DESYREL Take 200 mg by mouth at bedtime.   vancomycin 50 mg/mL oral solution Commonly known as:  VANCOCIN Take 2.5 mLs (125 mg total) by mouth 4 (four) times daily.      Follow-up Information    Radiontchenko, Alexei, MD. Schedule an appointment as soon as possible for a visit in 1 week(s).   Specialty:  Family Medicine Contact information: [address] [place] [address] [phone]          Allergies  Allergen Reactions  . Amoxil [Amoxicillin] Other (See Comments)  REACTION: C-diff  . Augmentin [Amoxicillin-Pot Clavulanate] Other (See Comments)    REACTION: C-Diff  . Dilaudid [Hydromorphone Hcl] Shortness Of Breath and Other (See Comments)    Stomach pain - reaction to injection of dilaudid and reglan together  . Reglan [Metoclopramide] Shortness Of Breath and Other (See Comments)    Stomach pain - reaction to injection of dilaudid and reglan together  . Ciprofloxacin Diarrhea and Other (See Comments)    Caused C-diff  . Demerol [Meperidine] Nausea And Vomiting  . Infliximab Nausea Only    Reaction to Remicade/ flu like symptoms  . Morphine And Related Nausea And Vomiting  . Sulfamethoxazole-Trimethoprim Other (See Comments)    Caused renal failure in combination with Prednisone  . Vitamin D Analogs Rash  . Levaquin [Levofloxacin] Other (See Comments)    Joint pain  . Ace Inhibitors Cough    Reaction to ramipril    Consultations: General surgery  Procedures/Studies: CT scan,  echocardiogram  Subjective: Patient was seen and examined at bedside. Denied headache, dizziness, nausea, vomiting, chest, shortness of breath. No abdominal pain. Diarrhea is improving. Very eager to go home today.  Discharge Exam: Vitals:   09/10/16 0215 09/10/16 0530  BP: (!) 151/79 (!) 154/86  Pulse: 61 64  Resp: 18 18  Temp: 98.1 F (36.7 C) 98.1 F (36.7 C)   Vitals:   09/09/16 1419 09/09/16 2128 09/10/16 0215 09/10/16 0530  BP: 132/71 (!) 158/95 (!) 151/79 (!) 154/86  Pulse: 63 73 61 64  Resp: 18 18 18 18   Temp: 98.2 F (36.8 C) 98.4 F (36.9 C) 98.1 F (36.7 C) 98.1 F (36.7 C)  TempSrc: Oral Oral Oral Oral  SpO2: 95% 97% 94% 97%  Weight:      Height:        General: Pt is alert, awake, not in acute distress Cardiovascular: RRR, S1/S2 +, no rubs, no gallops Respiratory: CTA bilaterally, no wheezing, no rhonchi Abdominal: Soft, NT, ND, bowel sounds + Extremities: no edema, no cyanosis    The results of significant diagnostics from this hospitalization (including imaging, microbiology, ancillary and laboratory) are listed below for reference.     Microbiology: Recent Results (from the past 240 hour(s))  C difficile quick scan w PCR reflex     Status: Abnormal   Collection Time: 09/08/16 10:54 PM  Result Value Ref Range Status   C Diff antigen POSITIVE (A) NEGATIVE Final   C Diff toxin NEGATIVE NEGATIVE Final   C Diff interpretation Results are indeterminate. See PCR results.  Final  Clostridium Difficile by PCR     Status: Abnormal   Collection Time: 09/08/16 10:54 PM  Result Value Ref Range Status   Toxigenic C Difficile by pcr POSITIVE (A) NEGATIVE Final    Comment: Positive for toxigenic C. difficile with little to no toxin production. Only treat if clinical presentation suggests symptomatic illness. RESULT CALLED TO, READ BACK BY AND VERIFIED WITH: Karin Golden RN, AT [phone] 09/09/16 BY D.VANHOOK      Labs: BNP (last 3 results)  Recent Labs   09/05/16 0039  BNP 32.9   Basic Metabolic Panel:  Recent Labs Lab 09/05/16 0445 09/06/16 0433 09/07/16 1009 09/08/16 0522 09/09/16 0431  NA 137 140 139 137 136  K 4.1 3.9 3.7 3.5 3.6  CL 97* 104 104 103 101  CO2 27 27 26 25 26   GLUCOSE 121* 109* 103* 116* 113*  BUN 25* 27* 18 12 10   CREATININE 1.59* 1.93* 1.52* 1.33* 1.27*  CALCIUM  9.2 8.5* 8.2* 8.2* 8.1*  MG  --   --  1.4* 2.0  --    Liver Function Tests:  Recent Labs Lab 09/04/16 1920  AST 30  ALT 35  ALKPHOS 54  BILITOT 1.2  PROT 7.2  ALBUMIN 3.9    Recent Labs Lab 09/04/16 1920  LIPASE 42   No results for input(s): AMMONIA in the last 168 hours. CBC:  Recent Labs Lab 09/05/16 0445 09/06/16 0433 09/07/16 1009 09/08/16 0522 09/09/16 0431  WBC 5.8 4.8 4.5 5.0 5.3  NEUTROABS  --  2.5 2.5  --  2.5  HGB 14.0 12.3* 12.2* 12.8* 12.6*  HCT 43.2 38.7* 37.4* 39.1 39.1  MCV 86.7 87.4 87.2 85.7 85.6  PLT 126* 113* 112* 128* 145*   Cardiac Enzymes:  Recent Labs Lab 09/05/16 0039 09/05/16 0445 09/05/16 1054  TROPONINI <0.03 <0.03 <0.03   BNP: Invalid input(s): POCBNP CBG:  Recent Labs Lab 09/07/16 0744 09/07/16 1712 09/08/16 0745 09/09/16 0755 09/10/16 0729  GLUCAP 96 130* 113* 121* 111*   D-Dimer No results for input(s): DDIMER in the last 72 hours. Hgb A1c No results for input(s): HGBA1C in the last 72 hours. Lipid Profile No results for input(s): CHOL, HDL, LDLCALC, TRIG, CHOLHDL, LDLDIRECT in the last 72 hours. Thyroid function studies No results for input(s): TSH, T4TOTAL, T3FREE, THYROIDAB in the last 72 hours.  Invalid input(s): FREET3 Anemia work up No results for input(s): VITAMINB12, FOLATE, FERRITIN, TIBC, IRON, RETICCTPCT in the last 72 hours. Urinalysis    Component Value Date/Time   COLORURINE YELLOW 09/04/2016 [place] 09/04/2016 1749   LABSPEC 1.016 09/04/2016 1749   PHURINE 5.0 09/04/2016 1749   GLUCOSEU NEGATIVE 09/04/2016 1749   HGBUR NEGATIVE  09/04/2016 1749   BILIRUBINUR NEGATIVE 09/04/2016 1749   KETONESUR NEGATIVE 09/04/2016 1749   PROTEINUR 30 (A) 09/04/2016 1749   NITRITE NEGATIVE 09/04/2016 1749   LEUKOCYTESUR NEGATIVE 09/04/2016 1749   Sepsis Labs Invalid input(s): PROCALCITONIN,  WBC,  LACTICIDVEN Microbiology Recent Results (from the past 240 hour(s))  C difficile quick scan w PCR reflex     Status: Abnormal   Collection Time: 09/08/16 10:54 PM  Result Value Ref Range Status   C Diff antigen POSITIVE (A) NEGATIVE Final   C Diff toxin NEGATIVE NEGATIVE Final   C Diff interpretation Results are indeterminate. See PCR results.  Final  Clostridium Difficile by PCR     Status: Abnormal   Collection Time: 09/08/16 10:54 PM  Result Value Ref Range Status   Toxigenic C Difficile by pcr POSITIVE (A) NEGATIVE Final    Comment: Positive for toxigenic C. difficile with little to no toxin production. Only treat if clinical presentation suggests symptomatic illness. RESULT CALLED TO, READ BACK BY AND VERIFIED WITH: Karin Golden RN, AT [phone] 09/09/16 BY D.VANHOOK      Time coordinating discharge: 28 minutes  SIGNED:   Rosita Fire, MD  [facility] 09/10/2016, 11:24 AM  If 7PM-7AM, please contact night-coverage [email or web address] Password [email or web address]

## 2016-09-10 NOTE — Care Management (Addendum)
Spoke to [employer] 1 [phone] ext [phone] at [facility] , she confirmed receipt of fax and will have [facility] fill prescription today. Daughter Sharrie Rothman aware and will pick up today.   Patient's discharge summary and prescriptions faxed to his PCP at the [facility] spoke with Lenna Sciara , was told to fax prescription to [phone] , same done   Magdalen Spatz RN BSN [phone]     Spoke with patient's daughter Benita Gutter , explained I spoke and faxed everything to Dorian Pod , Dr Dorris Singh nurse who said Dr Arletha Grippe will approve vancomycin prescription and patient can pick up today at [place].   Carrie requesting NCM to fax to [place] will give [place] NCM direct number .  Awaiting call with fax number to fax prescription.   Magdalen Spatz RN BSN

## 2016-09-10 NOTE — Care Management Note (Addendum)
Case Management Note  Patient Details  Name: Bill Dunn MRN: [id number] Date of Birth: 1936/04/11  Subjective/Objective:                    Action/Plan:   6979 Prescription for Vancomycin and discharge summary faxed to Dorian Pod , Dr Dorris Singh nurse . Dorian Pod aware patient being discharged today , and going to [place] to fill prescription, she will have Dr Arletha Grippe approve prescription.   Spoke with Dorian Pod from [facility]le[facility], Dorian Pod requesting NCM re fax all information and she will arrange outpatient PT through the [facility] and patient will have to go to [place] to fill all prescriptions including Vancomycin.   Fax sent and confirmed. Patient aware. Expected Discharge Date:  09/08/16               Expected Discharge Plan:  Home/Self Care  In-House Referral:     Discharge planning Services  CM Consult, Medication Assistance  Post Acute Care Choice:    Choice offered to:  Patient  DME Arranged:    DME Agency:     HH Arranged:    [place] Agency:     Status of Service:  In process, will continue to follow  If discussed at Long Length of Stay Meetings, dates discussed:    Additional Comments:  Marilu Favre, RN 09/10/2016, 10:00 AM

## 2016-09-13 ENCOUNTER — Emergency Department (HOSPITAL_COMMUNITY): Payer: Medicare Other

## 2016-09-13 ENCOUNTER — Encounter (HOSPITAL_COMMUNITY): Payer: Self-pay

## 2016-09-13 ENCOUNTER — Inpatient Hospital Stay (HOSPITAL_COMMUNITY)
Admission: EM | Admit: 2016-09-13 | Discharge: 2016-09-18 | DRG: 871 | Disposition: A | Discharge status: Home / Self Care | Payer: Medicare Other | Attending: Internal Medicine | Admitting: Internal Medicine

## 2016-09-13 ENCOUNTER — Inpatient Hospital Stay (HOSPITAL_COMMUNITY): Payer: Medicare Other

## 2016-09-13 DIAGNOSIS — G9340 Encephalopathy, unspecified: Secondary | ICD-10-CM | POA: Diagnosis present

## 2016-09-13 DIAGNOSIS — Z882 Allergy status to sulfonamides status: Secondary | ICD-10-CM

## 2016-09-13 DIAGNOSIS — Z8551 Personal history of malignant neoplasm of bladder: Secondary | ICD-10-CM

## 2016-09-13 DIAGNOSIS — K219 Gastro-esophageal reflux disease without esophagitis: Secondary | ICD-10-CM | POA: Diagnosis present

## 2016-09-13 DIAGNOSIS — E876 Hypokalemia: Secondary | ICD-10-CM | POA: Diagnosis present

## 2016-09-13 DIAGNOSIS — D696 Thrombocytopenia, unspecified: Secondary | ICD-10-CM | POA: Diagnosis present

## 2016-09-13 DIAGNOSIS — G4700 Insomnia, unspecified: Secondary | ICD-10-CM | POA: Diagnosis present

## 2016-09-13 DIAGNOSIS — Z881 Allergy status to other antibiotic agents status: Secondary | ICD-10-CM

## 2016-09-13 DIAGNOSIS — D649 Anemia, unspecified: Secondary | ICD-10-CM | POA: Diagnosis present

## 2016-09-13 DIAGNOSIS — I129 Hypertensive chronic kidney disease with stage 1 through stage 4 chronic kidney disease, or unspecified chronic kidney disease: Secondary | ICD-10-CM | POA: Diagnosis present

## 2016-09-13 DIAGNOSIS — E785 Hyperlipidemia, unspecified: Secondary | ICD-10-CM | POA: Diagnosis present

## 2016-09-13 DIAGNOSIS — A0472 Enterocolitis due to Clostridium difficile, not specified as recurrent: Secondary | ICD-10-CM

## 2016-09-13 DIAGNOSIS — N179 Acute kidney failure, unspecified: Secondary | ICD-10-CM | POA: Diagnosis present

## 2016-09-13 DIAGNOSIS — G8929 Other chronic pain: Secondary | ICD-10-CM | POA: Diagnosis present

## 2016-09-13 DIAGNOSIS — K56600 Partial intestinal obstruction, unspecified as to cause: Secondary | ICD-10-CM

## 2016-09-13 DIAGNOSIS — N189 Chronic kidney disease, unspecified: Secondary | ICD-10-CM | POA: Diagnosis not present

## 2016-09-13 DIAGNOSIS — Z79899 Other long term (current) drug therapy: Secondary | ICD-10-CM

## 2016-09-13 DIAGNOSIS — Z66 Do not resuscitate: Secondary | ICD-10-CM | POA: Diagnosis present

## 2016-09-13 DIAGNOSIS — N183 Chronic kidney disease, stage 3 unspecified: Secondary | ICD-10-CM | POA: Diagnosis present

## 2016-09-13 DIAGNOSIS — R6520 Severe sepsis without septic shock: Secondary | ICD-10-CM | POA: Diagnosis present

## 2016-09-13 DIAGNOSIS — Z7951 Long term (current) use of inhaled steroids: Secondary | ICD-10-CM | POA: Diagnosis not present

## 2016-09-13 DIAGNOSIS — R410 Disorientation, unspecified: Secondary | ICD-10-CM | POA: Diagnosis present

## 2016-09-13 DIAGNOSIS — Z8673 Personal history of transient ischemic attack (TIA), and cerebral infarction without residual deficits: Secondary | ICD-10-CM

## 2016-09-13 DIAGNOSIS — K5090 Crohn's disease, unspecified, without complications: Secondary | ICD-10-CM | POA: Diagnosis present

## 2016-09-13 DIAGNOSIS — J189 Pneumonia, unspecified organism: Secondary | ICD-10-CM

## 2016-09-13 DIAGNOSIS — R140 Abdominal distension (gaseous): Secondary | ICD-10-CM

## 2016-09-13 DIAGNOSIS — I10 Essential (primary) hypertension: Secondary | ICD-10-CM | POA: Diagnosis not present

## 2016-09-13 DIAGNOSIS — A419 Sepsis, unspecified organism: Principal | ICD-10-CM

## 2016-09-13 DIAGNOSIS — I471 Supraventricular tachycardia: Secondary | ICD-10-CM | POA: Diagnosis not present

## 2016-09-13 LAB — CBC
HEMATOCRIT: 40.7 % (ref 39.0–52.0)
Hemoglobin: 13.8 g/dL (ref 13.0–17.0)
MCH: 28.8 pg (ref 26.0–34.0)
MCHC: 33.9 g/dL (ref 30.0–36.0)
MCV: 85 fL (ref 78.0–100.0)
Platelets: 174 10*3/uL (ref 150–400)
RBC: 4.79 MIL/uL (ref 4.22–5.81)
RDW: 14.1 % (ref 11.5–15.5)
WBC: 16.3 10*3/uL — AB (ref 4.0–10.5)

## 2016-09-13 LAB — COMPREHENSIVE METABOLIC PANEL
ALT: 23 U/L (ref 17–63)
ANION GAP: 12 (ref 5–15)
AST: 22 U/L (ref 15–41)
Albumin: 3.3 g/dL — ABNORMAL LOW (ref 3.5–5.0)
Alkaline Phosphatase: 64 U/L (ref 38–126)
BILIRUBIN TOTAL: 1.2 mg/dL (ref 0.3–1.2)
BUN: 14 mg/dL (ref 6–20)
CHLORIDE: 103 mmol/L (ref 101–111)
CO2: 21 mmol/L — ABNORMAL LOW (ref 22–32)
Calcium: 8.2 mg/dL — ABNORMAL LOW (ref 8.9–10.3)
Creatinine, Ser: 1.75 mg/dL — ABNORMAL HIGH (ref 0.61–1.24)
GFR, EST AFRICAN AMERICAN: 41 mL/min — AB (ref >60)
GFR, EST NON AFRICAN AMERICAN: 36 mL/min — AB (ref >60)
Glucose, Bld: 135 mg/dL — ABNORMAL HIGH (ref 65–99)
POTASSIUM: 3.2 mmol/L — AB (ref 3.5–5.1)
Sodium: 136 mmol/L (ref 135–145)
TOTAL PROTEIN: 6.8 g/dL (ref 6.5–8.1)

## 2016-09-13 LAB — DIFFERENTIAL
BASOS PCT: 0 %
Basophils Absolute: 0 10*3/uL (ref 0.0–0.1)
EOS ABS: 0 10*3/uL (ref 0.0–0.7)
Eosinophils Relative: 0 %
Lymphocytes Relative: 13 %
Lymphs Abs: 2.2 10*3/uL (ref 0.7–4.0)
MONO ABS: 1.3 10*3/uL — AB (ref 0.1–1.0)
MONOS PCT: 8 %
NEUTROS PCT: 79 %
Neutro Abs: 12.8 10*3/uL — ABNORMAL HIGH (ref 1.7–7.7)

## 2016-09-13 LAB — URINALYSIS, ROUTINE W REFLEX MICROSCOPIC
Bilirubin Urine: NEGATIVE
Glucose, UA: NEGATIVE mg/dL
Hgb urine dipstick: NEGATIVE
KETONES UR: NEGATIVE mg/dL
Nitrite: POSITIVE — AB
PH: 5 (ref 5.0–8.0)
Protein, ur: 30 mg/dL — AB
SPECIFIC GRAVITY, URINE: 1.014 (ref 1.005–1.030)
SQUAMOUS EPITHELIAL / LPF: NONE SEEN

## 2016-09-13 LAB — MAGNESIUM: Magnesium: 1.2 mg/dL — ABNORMAL LOW (ref 1.7–2.4)

## 2016-09-13 LAB — I-STAT CG4 LACTIC ACID, ED: Lactic Acid, Venous: 1.38 mmol/L (ref 0.5–1.9)

## 2016-09-13 LAB — LACTIC ACID, PLASMA: LACTIC ACID, VENOUS: 1.6 mmol/L (ref 0.5–1.9)

## 2016-09-13 LAB — EXPECTORATED SPUTUM ASSESSMENT W REFEX TO RESP CULTURE

## 2016-09-13 LAB — PHOSPHORUS: PHOSPHORUS: 3.6 mg/dL (ref 2.5–4.6)

## 2016-09-13 LAB — TROPONIN I

## 2016-09-13 LAB — EXPECTORATED SPUTUM ASSESSMENT W GRAM STAIN, RFLX TO RESP C

## 2016-09-13 MED ORDER — HYDRALAZINE HCL 20 MG/ML IJ SOLN: 10.0000 mg | Freq: Three times a day (TID) | INTRAMUSCULAR | Status: DC | PRN

## 2016-09-13 MED ORDER — VANCOMYCIN HCL IN DEXTROSE 1-5 GM/200ML-% IV SOLN
1000.0000 mg | INTRAVENOUS | Status: AC
  Administered 2016-09-13: 1000 mg via INTRAVENOUS
  Filled 2016-09-13: qty 200

## 2016-09-13 MED ORDER — ALBUTEROL SULFATE (2.5 MG/3ML) 0.083% IN NEBU
2.5000 mg | INHALATION_SOLUTION | RESPIRATORY_TRACT | Status: DC | PRN
  Administered 2016-09-13: 2.5 mg via RESPIRATORY_TRACT
  Filled 2016-09-13: qty 3

## 2016-09-13 MED ORDER — VANCOMYCIN HCL 10 G IV SOLR
1250.0000 mg | INTRAVENOUS | Status: DC
  Administered 2016-09-14: 1250 mg via INTRAVENOUS
  Filled 2016-09-13 (×2): qty 1250

## 2016-09-13 MED ORDER — DEXTROSE 5 % IV SOLN
2.0000 g | Freq: Once | INTRAVENOUS | Status: AC
  Administered 2016-09-13: 2 g via INTRAVENOUS
  Filled 2016-09-13: qty 2

## 2016-09-13 MED ORDER — IOPAMIDOL (ISOVUE-300) INJECTION 61%
INTRAVENOUS | Status: AC
  Filled 2016-09-13: qty 30

## 2016-09-13 MED ORDER — SODIUM CHLORIDE 0.9 % IV SOLN
INTRAVENOUS | Status: DC
  Administered 2016-09-13 – 2016-09-14 (×2): via INTRAVENOUS

## 2016-09-13 MED ORDER — IPRATROPIUM-ALBUTEROL 0.5-2.5 (3) MG/3ML IN SOLN
3.0000 mL | Freq: Two times a day (BID) | RESPIRATORY_TRACT | Status: DC
  Administered 2016-09-14 – 2016-09-18 (×8): 3 mL via RESPIRATORY_TRACT
  Filled 2016-09-13 (×9): qty 3

## 2016-09-13 MED ORDER — ACETAMINOPHEN 500 MG PO TABS
1000.0000 mg | ORAL_TABLET | Freq: Once | ORAL | Status: AC
  Administered 2016-09-13: 1000 mg via ORAL
  Filled 2016-09-13: qty 2

## 2016-09-13 MED ORDER — ONDANSETRON HCL 4 MG PO TABS
4.0000 mg | ORAL_TABLET | Freq: Four times a day (QID) | ORAL | Status: DC | PRN
  Administered 2016-09-13 – 2016-09-18 (×2): 4 mg via ORAL
  Filled 2016-09-13 (×2): qty 1

## 2016-09-13 MED ORDER — VANCOMYCIN 50 MG/ML ORAL SOLUTION
125.0000 mg | Freq: Once | ORAL | Status: AC
  Administered 2016-09-13: 125 mg via ORAL
  Filled 2016-09-13: qty 2.5

## 2016-09-13 MED ORDER — DEXTROSE 5 % IV SOLN
2.0000 g | Freq: Two times a day (BID) | INTRAVENOUS | Status: DC
  Filled 2016-09-13: qty 2

## 2016-09-13 MED ORDER — FENTANYL CITRATE (PF) 100 MCG/2ML IJ SOLN
25.0000 ug | Freq: Three times a day (TID) | INTRAMUSCULAR | Status: DC | PRN
  Administered 2016-09-13 – 2016-09-14 (×3): 25 ug via INTRAVENOUS
  Filled 2016-09-13 (×3): qty 2

## 2016-09-13 MED ORDER — IOPAMIDOL (ISOVUE-300) INJECTION 61%
INTRAVENOUS | Status: AC
  Filled 2016-09-13: qty 100

## 2016-09-13 MED ORDER — DEXTROSE 5 % IV SOLN
2.0000 g | Freq: Two times a day (BID) | INTRAVENOUS | Status: DC
  Administered 2016-09-13 – 2016-09-14 (×3): 2 g via INTRAVENOUS
  Filled 2016-09-13 (×5): qty 2

## 2016-09-13 MED ORDER — VANCOMYCIN 50 MG/ML ORAL SOLUTION
125.0000 mg | Freq: Four times a day (QID) | ORAL | Status: DC
  Administered 2016-09-13 – 2016-09-15 (×8): 125 mg via ORAL
  Filled 2016-09-13 (×8): qty 2.5

## 2016-09-13 MED ORDER — SODIUM CHLORIDE 0.9 % IV BOLUS (SEPSIS)
1000.0000 mL | Freq: Once | INTRAVENOUS | Status: AC
  Administered 2016-09-13: 1000 mL via INTRAVENOUS

## 2016-09-13 MED ORDER — IPRATROPIUM-ALBUTEROL 0.5-2.5 (3) MG/3ML IN SOLN
3.0000 mL | Freq: Four times a day (QID) | RESPIRATORY_TRACT | Status: DC
  Administered 2016-09-13: 3 mL via RESPIRATORY_TRACT
  Filled 2016-09-13: qty 3

## 2016-09-13 MED ORDER — ENOXAPARIN SODIUM 40 MG/0.4ML ~~LOC~~ SOLN
40.0000 mg | SUBCUTANEOUS | Status: DC
  Administered 2016-09-13: 40 mg via SUBCUTANEOUS
  Filled 2016-09-13: qty 0.4

## 2016-09-13 MED ORDER — HYDROCODONE-ACETAMINOPHEN 5-325 MG PO TABS
1.0000 | ORAL_TABLET | ORAL | Status: DC | PRN
  Administered 2016-09-13 – 2016-09-15 (×6): 1 via ORAL
  Filled 2016-09-13 (×6): qty 1

## 2016-09-13 MED ORDER — VANCOMYCIN HCL IN DEXTROSE 1-5 GM/200ML-% IV SOLN
1000.0000 mg | Freq: Once | INTRAVENOUS | Status: AC
  Administered 2016-09-13: 1000 mg via INTRAVENOUS
  Filled 2016-09-13: qty 200

## 2016-09-13 MED ORDER — ATORVASTATIN CALCIUM 80 MG PO TABS
80.0000 mg | ORAL_TABLET | Freq: Every day | ORAL | Status: DC
  Administered 2016-09-13 – 2016-09-17 (×5): 80 mg via ORAL
  Filled 2016-09-13 (×5): qty 1

## 2016-09-13 MED ORDER — COLESTIPOL HCL 1 G PO TABS
1.0000 g | ORAL_TABLET | Freq: Two times a day (BID) | ORAL | Status: DC
  Administered 2016-09-13 – 2016-09-18 (×10): 1 g via ORAL
  Filled 2016-09-13 (×12): qty 1

## 2016-09-13 MED ORDER — ONDANSETRON HCL 4 MG/2ML IJ SOLN
4.0000 mg | Freq: Four times a day (QID) | INTRAMUSCULAR | Status: DC | PRN
  Administered 2016-09-13 – 2016-09-17 (×7): 4 mg via INTRAVENOUS
  Filled 2016-09-13 (×7): qty 2

## 2016-09-13 MED ORDER — ACETAMINOPHEN 325 MG PO TABS
650.0000 mg | ORAL_TABLET | Freq: Four times a day (QID) | ORAL | Status: DC | PRN
  Administered 2016-09-13 – 2016-09-14 (×3): 650 mg via ORAL
  Filled 2016-09-13 (×4): qty 2

## 2016-09-13 MED ORDER — ALBUTEROL SULFATE (2.5 MG/3ML) 0.083% IN NEBU
2.5000 mg | INHALATION_SOLUTION | RESPIRATORY_TRACT | Status: DC | PRN
  Administered 2016-09-14: 2.5 mg via RESPIRATORY_TRACT
  Filled 2016-09-13: qty 3

## 2016-09-13 MED ORDER — ACETAMINOPHEN 650 MG RE SUPP: 650.0000 mg | Freq: Four times a day (QID) | RECTAL | Status: DC | PRN

## 2016-09-13 NOTE — ED Notes (Addendum)
Pt and family aware of need for urine

## 2016-09-13 NOTE — ED Notes (Signed)
Attempted Report 

## 2016-09-13 NOTE — H&P (Signed)
[facility] History and Physical  Bill Dunn PJA:[id number] DOB: 01/05/1936 DOA: 09/13/2016  Referring physician:  PCP: Bill Geralds, MD   Chief Complaint: "I asked him his name and he just looked at me confused."-Daughter  HPI: Bill Dunn is a 81 y.o. male  with past mental history significant for bladder cancer, chronic bronchitis, CK D, Crohn's disease, hypertension, pericarditis, super ventricular tachycardia, TIA and recent discharge from the hospital with diagnoses of small bowel obstruction and Crohn's disease presents to the hospital with chief complaint of acute encephalopathy. History gathered from patient and daughter. Patient has been home for the last 3 days. Patient has been doing well with a soft diet and try to keep of fluids. However patient is still having frequent diarrhea with unformed stools. Patient often has issues with making it to the bathroom on time. Other than that patient has been doing well. Suddenly this morning around 2 AM patient became confused. This concerned the family. Over the course of the morning the patient did not improve. Family didn't brought him by private vehicle to the emergency room for evaluation.  ED course: Patient started on sepsis protocol. Started on vancomycin and cefotetan IV. Chest x-ray did not show acute pneumonia. UA pending. CT head done which was negative for acute abnormality. Hospitalist consulted for admission.   Review of Systems:  As per HPI otherwise 10 point review of systems negative.    Past Medical History:  Diagnosis Date  . AAA (abdominal aortic aneurysm) ([place])    a. s/p stent grafting in 2012, in [place], [place], per pt.  . Arthritis   . Bladder cancer ([place])   . Chronic bronchitis ([place])   . CKD (chronic kidney disease), stage III   . Crohn disease ([place])    a. s/p multiple bowel resections;  b. s/p portacath.  . Disability of walking    a. no feeling in right leg since surgical complication ~  3419 - predominantly uses w/c.  Marland Kitchen Dysphagia    a. 03/2016 EGD: nl EGD. Esoph dil performed.  Marland Kitchen HTN (hypertension)   . Hypertriglyceridemia   . Lung nodule    a. RML -->stable dating back to 02/2014.  . Morbid obesity ([place])   . Pericarditis 04/13/2013  . Pleuritic chest pain    a. s/p reportedly nl cath in 2012, [place], [place];  b. 04/2013 Cath: LM nl, LAD 70m, D1 nl, LCX nl, OM1 20, OM2 sm 18m, RCA 40p/m, 20d, EF 55-65%;  c. 04/2013 Echo: EF 55%, no rwma;  d. chest pain improved with colchicine.  . Sepsis ([place]) ? 2002  . SVT (supraventricular tachycardia) ([place])    a. 04/2013->bb therapy.  Marland Kitchen TIA (transient ischemic attack)    a. x 2, last ~ 2002   Past Surgical History:  Procedure Laterality Date  . ABDOMINAL AORTIC ANEURYSM REPAIR     a. reported stenting 2012  . APPENDECTOMY    . CHOLECYSTECTOMY    . COCHLEAR IMPLANT    . COLON SURGERY     a. multiple bowel resections 2/2 chrohn's dzs.  . INGUINAL HERNIA REPAIR    . LEFT HEART CATHETERIZATION WITH CORONARY ANGIOGRAM Bilateral 04/13/2013   Procedure: LEFT HEART CATHETERIZATION WITH CORONARY ANGIOGRAM;  Surgeon: Peter M Martinique, MD;  Location[facility] C[facility] LAB;  Service: Cardiovascular;  Laterality: Bilateral;  . Left Shoulder Surgery    . RENAL CYST EXCISION    . spinal fusion     Social History:  reports that he quit smoking  about 25 years ago. He has quit using smokeless tobacco. He reports that he does not drink alcohol or use drugs.  Allergies  Allergen Reactions  . Amoxil [Amoxicillin] Other (See Comments)    REACTION: C-diff  . Augmentin [Amoxicillin-Pot Clavulanate] Other (See Comments)    REACTION: C-Diff  . Dilaudid [Hydromorphone Hcl] Shortness Of Breath and Other (See Comments)    Stomach pain - reaction to injection of dilaudid and reglan together  . Reglan [Metoclopramide] Shortness Of Breath and Other (See Comments)    Stomach pain - reaction to injection of dilaudid and reglan together  . Ciprofloxacin  Diarrhea and Other (See Comments)    Caused C-diff  . Demerol [Meperidine] Nausea And Vomiting  . Infliximab Nausea Only    Reaction to Remicade/ flu like symptoms  . Morphine And Related Nausea And Vomiting  . Sulfamethoxazole-Trimethoprim Other (See Comments)    Caused renal failure in combination with Prednisone  . Vitamin D Analogs Rash  . Levaquin [Levofloxacin] Other (See Comments)    Joint pain  . Ace Inhibitors Cough    Reaction to ramipril    Family History  Problem Relation Age of Onset  . CAD Father        died @ 34  . CAD Mother        died @ 54     Prior to Admission medications   Medication Sig Start Date End Date Taking? Authorizing Provider  albuterol (PROVENTIL HFA;VENTOLIN HFA) 108 (90 Base) MCG/ACT inhaler Inhale 2 puffs into the lungs every 6 (six) hours as needed for wheezing or shortness of breath.   Yes [provider]  amLODipine (NORVASC) 10 MG tablet Take 1 tablet (10 mg total) by mouth daily. 04/28/14  Yes Isaiah Serge, NP  atorvastatin (LIPITOR) 80 MG tablet Take 80 mg by mouth at bedtime.   Yes [provider]  colestipol (COLESTID) 1 G tablet Take 1 g by mouth 2 (two) times daily.   Yes [provider]  diphenoxylate-atropine (LOMOTIL) 2.5-0.025 MG per tablet Take 1 tablet by mouth 2 (two) times daily.    Yes [provider]  hydrochlorothiazide (HYDRODIURIL) 25 MG tablet Take 1 tablet (25 mg total) by mouth every other day. Patient taking differently: Take 12.5 mg by mouth daily.  05/02/16  Yes Patrecia Pour, Christean Grief, MD  metoprolol (TOPROL-XL) 200 MG 24 hr tablet Take 200 mg by mouth at bedtime.   Yes [provider]  montelukast (SINGULAIR) 10 MG tablet Take 1 tablet (10 mg total) by mouth at bedtime. 05/02/16  Yes Patrecia Pour, Christean Grief, MD  traZODone (DESYREL) 100 MG tablet Take 200 mg by mouth at bedtime.    Yes [provider]  vancomycin (VANCOCIN) 50 mg/mL oral solution Take 2.5 mLs (125 mg  total) by mouth 4 (four) times daily. 09/10/16 09/23/16 Yes Rosita Fire, MD  aspirin EC 81 MG EC tablet Take 1 tablet (81 mg total) by mouth daily. Patient not taking: Reported on 05/01/2016 04/28/14   Isaiah Serge, NP  cyclobenzaprine (FLEXERIL) 5 MG tablet Take 5 mg by mouth 3 (three) times daily as needed for muscle spasms.    [provider]  HYDROcodone-acetaminophen (NORCO/VICODIN) 5-325 MG tablet Take 1 tablet by mouth every 4 (four) hours as needed for moderate pain. 05/02/16   Doreatha Lew, MD  nitroGLYCERIN (NITROSTAT) 0.4 MG SL tablet Place 1 tablet (0.4 mg total) under the tongue every 5 (five) minutes x 3 doses as needed for  chest pain. 04/14/13   Rogelia Mire, NP   Physical Exam: Vitals:   09/13/16 1100 09/13/16 1115 09/13/16 1130 09/13/16 1145  BP: 101/65 118/60    Pulse: 67 73 68 65  Resp: 16 16 15 15   Temp:      TempSrc:      SpO2: 93% 95% 93% 93%    Wt Readings from Last 3 Encounters:  09/05/16 115.6 kg (254 lb 13.6 oz)  05/02/16 116.3 kg (256 lb 6.4 oz)  05/30/14 113.4 kg (249 lb 14.4 oz)    General:  Appears calm and comfortable; Alert and oriented 2 (person and place) Eyes:  PERRL, EOMI, normal lids, iris ENT:  grossly normal hearing, lips & tongue, dentures Neck:  no LAD, masses or thyromegaly Cardiovascular:  RRR, no m/r/g. No LE edema.  Respiratory:  Tachypnea, shallow effort, no wheezes or rales or rhonchi Abdomen:  Feels full, distended, midline defect and abdomen representing mesh placement, scattered bowel sounds, right lower quadrant and left lower quadrant tenderness without rebound or guarding Skin:  no rash or induration seen on limited exam, healing skin breakdown on back Musculoskeletal:  grossly normal tone BUE/BLE Psychiatric:  grossly normal mood and affect, speech fluent and appropriate Neurologic:  CN 2-12 grossly intact, moves all extremities in coordinated fashion.          Labs on Admission:  Basic  Metabolic Panel:  Recent Labs Lab 09/07/16 1009 09/08/16 0522 09/09/16 0431 09/13/16 0930  NA 139 137 136 136  K 3.7 3.5 3.6 3.2*  CL 104 103 101 103  CO2 26 25 26  21*  GLUCOSE 103* 116* 113* 135*  BUN 18 12 10 14   CREATININE 1.52* 1.33* 1.27* 1.75*  CALCIUM 8.2* 8.2* 8.1* 8.2*  MG 1.4* 2.0  --   --    Liver Function Tests:  Recent Labs Lab 09/13/16 0930  AST 22  ALT 23  ALKPHOS 64  BILITOT 1.2  PROT 6.8  ALBUMIN 3.3*   No results for input(s): LIPASE, AMYLASE in the last 168 hours. No results for input(s): AMMONIA in the last 168 hours. CBC:  Recent Labs Lab 09/07/16 1009 09/08/16 0522 09/09/16 0431 09/13/16 0930  WBC 4.5 5.0 5.3 16.3*  NEUTROABS 2.5  --  2.5 12.8*  HGB 12.2* 12.8* 12.6* 13.8  HCT 37.4* 39.1 39.1 40.7  MCV 87.2 85.7 85.6 85.0  PLT 112* 128* 145* 174   Cardiac Enzymes: No results for input(s): CKTOTAL, CKMB, CKMBINDEX, TROPONINI in the last 168 hours.  BNP (last 3 results)  Recent Labs  09/05/16 0039  BNP 40.7    ProBNP (last 3 results) No results for input(s): PROBNP in the last 8760 hours.   Serum creatinine: 1.75 mg/dL (H) 09/13/16 0930 Estimated creatinine clearance: 43.3 mL/min (A)  CBG:  Recent Labs Lab 09/07/16 0744 09/07/16 1712 09/08/16 0745 09/09/16 0755 09/10/16 0729  GLUCAP 96 130* 113* 121* 111*    Radiological Exams on Admission: Ct Head Wo Contrast  Result Date: 09/13/2016 CLINICAL DATA:  Confusion, disorientation, difficulty walking EXAM: CT HEAD WITHOUT CONTRAST TECHNIQUE: Contiguous axial images were obtained from the base of the skull through the vertex without intravenous contrast. COMPARISON:  CT head 04/13/2013 FINDINGS: Brain: Streak artifact from cochlear implant on the right. Ventricle size normal. Cerebral volume normal for age. Negative for acute infarct, hemorrhage, mass. Benign periventricular calcification on the left is stable from the prior study. Vascular: Negative for hyperdense vessel  Skull: No acute skeletal abnormality Sinuses/Orbits: Paranasal sinuses clear.  Right mastoidectomy and cochlear implant. Small amount of mucosal edema in the mastoid tip on the right. Other: None IMPRESSION: No acute abnormality. There is artifact related to cochlear implant on the right. Overall unchanged from 2014.44 Electronically Signed   By: Franchot Gallo M.D.   On: 09/13/2016 11:21   Dg Chest Portable 1 View  Result Date: 09/13/2016 CLINICAL DATA:  Fever. EXAM: PORTABLE CHEST 1 VIEW COMPARISON:  Sep 05, 2016 FINDINGS: The left Port-A-Cath terminates in the right atrium, unchanged. No pneumothorax. The cardiomediastinal silhouette is stable. No focal infiltrate. IMPRESSION: No active disease. Electronically Signed   By: Dorise Bullion III M.D   On: 09/13/2016 09:35    EKG: Independently reviewed. No STEMI  Assessment/Plan Principal Problem:   Sepsis with multi-organ dysfunction (HCC) Active Problems:   HTN (hypertension)   Gastroesophageal reflux disease without esophagitis   CKD (chronic kidney disease), stage III   C. difficile diarrhea   SIRS 2/2 resp source with MOD (kidneys/resp/neuro) Patient hemodynamically stable Given vanc emergency room, will continue Given cefepime in the emergency room, will continue Urine culture & UA pending Blood cultures 2 pending Patient given 1000 mL of fluid in the emergency room Lactic acid normal Scheduled DuoNeb's When necessary albuterol Oxygen therapy Continuous pulse oximetry AMS likely infectious metabolic, CT head neg  Hypoxia See above RT consult  C diff Cont oral vanc Enteric precautions CT of abd with oral contrast to re-eval; abdomen  AKI/CKD!!! Baseline Cr 1.3, Cr on admit 1.75 1L of normal saline given in the emergency room Gentle hydration overnight Checking magnesium and phosphorus Urine labs to cal FeUr not ordered as this is likely from hypoxia noted by EDP and sepsis PVR ordered  Hypertension When  necessary hydralazine 10 mg IV as needed for severe blood pressure Hold HCTZ, norvasc, lopressor  GERD Cont PPI  Hyperlipidemia Continue statin, colestipol  Chronic pain Hold flexeril  Seasonal allergies Singulair held  Insomnia Hold trazadone  Code Status: FULL  DVT Prophylaxis: lovenox Family Communication: dgtr at bedside Disposition Plan: Pending Improvement  Status: tele, inpt  Bill Mocha, MD Family Medicine [facility] [email or web address] Password [email or web address]

## 2016-09-13 NOTE — Progress Notes (Signed)
Pharmacy Antibiotic Note  Bill Dunn is a 81 y.o. male admitted on 09/13/2016 with pneumonia and and Cdiff.  Pharmacy has been consulted for Vancomycin IV and PO and Cefepime dosing.  Patient received Cefepime 2g IV x1 in ED at 10:54 AM and Vancomycin 1g IV at 11:29 AM as well as was started on Vancomycin 125 mg PO QID.   SCr 1.75/Estimated CrCl ~40-45 mL/min.  WBC 16.3, LA 1.38. T max 101.6  Plan: Give another Vancomycin 1 gram for a total of 2 grams today then 1250 mg IV every 24 hours.  Continue Cefepime 2g IV every 12 hours.  Continue Vancomycin 125 mg PO every 6 hours- ensure that therapy continues past end of IV antibiotics.   Follow-up clinical response, culture results, and renal function.      Temp (24hrs), Avg:99.9 F (37.7 C), Min:98.2 F (36.8 C), Max:101.6 F (38.7 C)   Recent Labs Lab 09/07/16 1009 09/08/16 0522 09/09/16 0431 09/13/16 0930 09/13/16 0948  WBC 4.5 5.0 5.3 16.3*  --   CREATININE 1.52* 1.33* 1.27* 1.75*  --   LATICACIDVEN  --   --   --   --  1.38    Estimated Creatinine Clearance: 43.3 mL/min (A) (by C-G formula based on SCr of 1.75 mg/dL (H)).    Allergies  Allergen Reactions  . Amoxil [Amoxicillin] Other (See Comments)    REACTION: C-diff  . Augmentin [Amoxicillin-Pot Clavulanate] Other (See Comments)    REACTION: C-Diff  . Dilaudid [Hydromorphone Hcl] Shortness Of Breath and Other (See Comments)    Stomach pain - reaction to injection of dilaudid and reglan together  . Reglan [Metoclopramide] Shortness Of Breath and Other (See Comments)    Stomach pain - reaction to injection of dilaudid and reglan together  . Ciprofloxacin Diarrhea and Other (See Comments)    Caused C-diff  . Demerol [Meperidine] Nausea And Vomiting  . Infliximab Nausea Only    Reaction to Remicade/ flu like symptoms  . Morphine And Related Nausea And Vomiting  . Sulfamethoxazole-Trimethoprim Other (See Comments)    Caused renal failure in combination with  Prednisone  . Vitamin D Analogs Rash  . Levaquin [Levofloxacin] Other (See Comments)    Joint pain  . Ace Inhibitors Cough    Reaction to ramipril    Antimicrobials this admission: Vancomycin IV 5/13 >> Cefepime IV 5/13 >> Vancomycin PO 5/13 >>  Dose adjustments this admission:   Microbiology results: 5/13 BCx:  5/13 UCx:  5/13 Sputum:   Thank you for allowing pharmacy to be a part of this patient's care.  Sloan Leiter, PharmD, BCPS Clinical Pharmacist Clinical phone 09/13/2016 until 3:30 PM- [phone] After hours, please call [phone] 09/13/2016 12:50 PM

## 2016-09-13 NOTE — Progress Notes (Signed)
Notified charge nurse that I called MD twice and got no response about pain medicine, he called the [facility] to try to get a hold of MD.

## 2016-09-13 NOTE — Progress Notes (Signed)
Patient requesting more pain medicine, paged MD Aggie Moats twice, no response.

## 2016-09-13 NOTE — ED Notes (Signed)
Condom cath placed on pt per request of family

## 2016-09-13 NOTE — ED Provider Notes (Signed)
[facility] DEPT Provider Note   CSN: [id number] Arrival date & time: 09/13/16  0102     History   Chief Complaint Chief Complaint  Patient presents with  . Altered Mental Status    HPI Khadim Lundberg is a 81 y.o. male.  HPI  81 year old male who presents with altered mental status. He has a history of chronic kidney disease, Crohn's disease status post multiple bowel resections, abdominal aortic aneurysm, hypertension, and hyperlipidemia. Patient was recently discharged from the hospital 3 days ago after management of the small bowel obstruction. Hospital course was complicated by C. difficile infection requiring oral vancomycin. Family reports that his diarrhea does seem to be more profuse since leaving the hospital. I he was normal behavior wise yesterday evening before bed. This morning they found that he was having difficulty walking, was very unsteady on his feet. He seemed very disoriented. States that he kept flipping the light switch on and off and taking his hearing aids in and out. He has been endorsing cough productive of sputum and some shortness of breath with intermittent chest discomfort. Has not had significant abdominal pain, nausea or vomiting, dysuria or urinary frequency. No new falls.  Past Medical History:  Diagnosis Date  . AAA (abdominal aortic aneurysm) ([place])    a. s/p stent grafting in 2012, in [place], [place], per pt.  . Arthritis   . Bladder cancer ([place])   . Chronic bronchitis ([place])   . CKD (chronic kidney disease), stage III   . Crohn disease ([place])    a. s/p multiple bowel resections;  b. s/p portacath.  . Disability of walking    a. no feeling in right leg since surgical complication ~ 7253 - predominantly uses w/c.  Marland Kitchen Dysphagia    a. 03/2016 EGD: nl EGD. Esoph dil performed.  Marland Kitchen HTN (hypertension)   . Hypertriglyceridemia   . Lung nodule    a. RML -->stable dating back to 02/2014.  . Morbid obesity ([place])   . Pericarditis 04/13/2013  .  Pleuritic chest pain    a. s/p reportedly nl cath in 2012, [place], [place];  b. 04/2013 Cath: LM nl, LAD 31m, D1 nl, LCX nl, OM1 20, OM2 sm 57m, RCA 40p/m, 20d, EF 55-65%;  c. 04/2013 Echo: EF 55%, no rwma;  d. chest pain improved with colchicine.  . Sepsis ([place]) ? 2002  . SVT (supraventricular tachycardia) ([place])    a. 04/2013->bb therapy.  Marland Kitchen TIA (transient ischemic attack)    a. x 2, last ~ 2002    Patient Active Problem List   Diagnosis Date Noted  . Sepsis with multi-organ dysfunction ([place]) 09/13/2016  . C. difficile diarrhea   . CKD (chronic kidney disease), stage III 09/05/2016  . Small bowel obstruction ([place])   . SBO (small bowel obstruction) ([place]) 09/04/2016  . Acute on chronic respiratory failure with hypoxia ([place]) 09/04/2016  . Fall   . Bladder cancer ([place]) 05/01/2016  . Other hyperlipidemia   . Gastroesophageal reflux disease without esophagitis   . NSVT (nonsustained ventricular tachycardia) ([place]) 04/28/2014  . Chest pain 04/27/2014  . Pleuritic chest pain 04/14/2013  . Crohn's disease ([place]) 04/14/2013  . HTN (hypertension) 04/14/2013  . Hypertriglyceridemia 04/14/2013  . History of AAA (abdominal aortic aneurysm) repair 04/14/2013  . Pericarditis 04/14/2013  . History of TIA (transient ischemic attack) 04/14/2013  . Acute pericarditis 04/13/2013    Past Surgical History:  Procedure Laterality Date  . ABDOMINAL AORTIC ANEURYSM REPAIR     a.  reported stenting 2012  . APPENDECTOMY    . CHOLECYSTECTOMY    . COCHLEAR IMPLANT    . COLON SURGERY     a. multiple bowel resections 2/2 chrohn's dzs.  . INGUINAL HERNIA REPAIR    . LEFT HEART CATHETERIZATION WITH CORONARY ANGIOGRAM Bilateral 04/13/2013   Procedure: LEFT HEART CATHETERIZATION WITH CORONARY ANGIOGRAM;  Surgeon: Peter M Martinique, MD;  Location[facility] CATH LAB;  Service: Cardiovascular;  Laterality: Bilateral;  . Left Shoulder Surgery    . RENAL CYST EXCISION    . spinal fusion         Home Medications      Prior to Admission medications   Medication Sig Start Date End Date Taking? Authorizing Provider  albuterol (PROVENTIL HFA;VENTOLIN HFA) 108 (90 Base) MCG/ACT inhaler Inhale 2 puffs into the lungs every 6 (six) hours as needed for wheezing or shortness of breath.   Yes [provider]  amLODipine (NORVASC) 10 MG tablet Take 1 tablet (10 mg total) by mouth daily. 04/28/14  Yes Isaiah Serge, NP  atorvastatin (LIPITOR) 80 MG tablet Take 80 mg by mouth at bedtime.   Yes [provider]  colestipol (COLESTID) 1 G tablet Take 1 g by mouth 2 (two) times daily.   Yes [provider]  diphenoxylate-atropine (LOMOTIL) 2.5-0.025 MG per tablet Take 1 tablet by mouth 2 (two) times daily.    Yes [provider]  hydrochlorothiazide (HYDRODIURIL) 25 MG tablet Take 1 tablet (25 mg total) by mouth every other day. Patient taking differently: Take 12.5 mg by mouth daily.  05/02/16  Yes Patrecia Pour, Christean Grief, MD  metoprolol (TOPROL-XL) 200 MG 24 hr tablet Take 200 mg by mouth at bedtime.   Yes [provider]  montelukast (SINGULAIR) 10 MG tablet Take 1 tablet (10 mg total) by mouth at bedtime. 05/02/16  Yes Patrecia Pour, Christean Grief, MD  traZODone (DESYREL) 100 MG tablet Take 200 mg by mouth at bedtime.    Yes [provider]  vancomycin (VANCOCIN) 50 mg/mL oral solution Take 2.5 mLs (125 mg total) by mouth 4 (four) times daily. 09/10/16 09/23/16 Yes Rosita Fire, MD  aspirin EC 81 MG EC tablet Take 1 tablet (81 mg total) by mouth daily. Patient not taking: Reported on 05/01/2016 04/28/14   Isaiah Serge, NP  cyclobenzaprine (FLEXERIL) 5 MG tablet Take 5 mg by mouth 3 (three) times daily as needed for muscle spasms.    [provider]  HYDROcodone-acetaminophen (NORCO/VICODIN) 5-325 MG tablet Take 1 tablet by mouth every 4 (four) hours as needed for moderate pain. 05/02/16   Doreatha Lew, MD  nitroGLYCERIN (NITROSTAT) 0.4 MG SL tablet Place  1 tablet (0.4 mg total) under the tongue every 5 (five) minutes x 3 doses as needed for chest pain. 04/14/13   Rogelia Mire, NP    Family History Family History  Problem Relation Age of Onset  . CAD Father        died @ 62  . CAD Mother        died @ 4    Social History Social History  Substance Use Topics  . Smoking status: Former Smoker    Quit date: 05/05/1991  . Smokeless tobacco: Former Systems developer  . Alcohol use No     Allergies   Amoxil [amoxicillin]; Augmentin [amoxicillin-pot clavulanate]; Dilaudid [hydromorphone hcl]; Reglan [metoclopramide]; Ciprofloxacin; Demerol [meperidine]; Infliximab; Morphine and related; Sulfamethoxazole-trimethoprim; Vitamin d analogs; Levaquin [levofloxacin]; and Ace inhibitors   Review of Systems Review  of Systems  Constitutional: Positive for fatigue.  HENT: Positive for congestion.   Respiratory: Positive for cough and shortness of breath.   Cardiovascular: Positive for chest pain.  Gastrointestinal: Positive for diarrhea. Negative for abdominal pain and vomiting.  Genitourinary: Positive for frequency. Negative for dysuria.  Psychiatric/Behavioral: Positive for confusion.  All other systems reviewed and are negative.    Physical Exam Updated Vital Signs BP 118/60   Pulse 65   Temp 98.2 F (36.8 C) (Oral)   Resp 15   SpO2 93%   Physical Exam Physical Exam  Nursing note and vitals reviewed. Constitutional: W non-toxic, and in no acute distress Head: Normocephalic and atraumatic.  Mouth/Throat: Oropharynx is clear and dry mucous membranes.  Neck: Normal range of motion. Neck supple.  Cardiovascular: Normal rate and regular rhythm.   Pulmonary/Chest: Effort normal and breath sounds normal.  Abdominal: Soft. There is no tenderness. There is no rebound and no guarding.  Musculoskeletal: Normal range of motion.  Neurological: Alert, no facial droop, fluent speech, moves all extremities symmetrically Skin: Skin is warm and  dry.  Psychiatric: Cooperative   ED Treatments / Results  Labs (all labs ordered are listed, but only abnormal results are displayed) Labs Reviewed  COMPREHENSIVE METABOLIC PANEL - Abnormal; Notable for the following:       Result Value   Potassium 3.2 (*)    CO2 21 (*)    Glucose, Bld 135 (*)    Creatinine, Ser 1.75 (*)    Calcium 8.2 (*)    Albumin 3.3 (*)    GFR calc non Af Amer 36 (*)    GFR calc Af Amer 41 (*)    All other components within normal limits  CBC - Abnormal; Notable for the following:    WBC 16.3 (*)    All other components within normal limits  DIFFERENTIAL - Abnormal; Notable for the following:    Neutro Abs 12.8 (*)    Monocytes Absolute 1.3 (*)    All other components within normal limits  CULTURE, BLOOD (ROUTINE X 2)  CULTURE, BLOOD (ROUTINE X 2)  URINE CULTURE  URINALYSIS, ROUTINE W REFLEX MICROSCOPIC  I-STAT CG4 LACTIC ACID, ED    EKG  EKG Interpretation  Date/Time:  Sunday Sep 13 2016 09:14:15 EDT Ventricular Rate:  78 PR Interval:    QRS Duration: 95 QT Interval:  373 QTC Calculation: 425 R Axis:   59 Text Interpretation:  Sinus rhythm Borderline low voltage, extremity leads no acute changes  Confirmed by Merrit Friesen MD, Delores Edelstein [phone]) on 09/13/2016 10:01:47 AM       Radiology Ct Head Wo Contrast  Result Date: 09/13/2016 CLINICAL DATA:  Confusion, disorientation, difficulty walking EXAM: CT HEAD WITHOUT CONTRAST TECHNIQUE: Contiguous axial images were obtained from the base of the skull through the vertex without intravenous contrast. COMPARISON:  CT head 04/13/2013 FINDINGS: Brain: Streak artifact from cochlear implant on the right. Ventricle size normal. Cerebral volume normal for age. Negative for acute infarct, hemorrhage, mass. Benign periventricular calcification on the left is stable from the prior study. Vascular: Negative for hyperdense vessel Skull: No acute skeletal abnormality Sinuses/Orbits: Paranasal sinuses clear. Right mastoidectomy  and cochlear implant. Small amount of mucosal edema in the mastoid tip on the right. Other: None IMPRESSION: No acute abnormality. There is artifact related to cochlear implant on the right. Overall unchanged from 2014.44 Electronically Signed   By: Franchot Gallo M.D.   On: 09/13/2016 11:21   Dg Chest Portable 1 View  Result  Date: 09/13/2016 CLINICAL DATA:  Fever. EXAM: PORTABLE CHEST 1 VIEW COMPARISON:  Sep 05, 2016 FINDINGS: The left Port-A-Cath terminates in the right atrium, unchanged. No pneumothorax. The cardiomediastinal silhouette is stable. No focal infiltrate. IMPRESSION: No active disease. Electronically Signed   By: Dorise Bullion III M.D   On: 09/13/2016 09:35    Procedures Procedures (including critical care time)  Medications Ordered in ED Medications  vancomycin (VANCOCIN) 50 mg/mL oral solution 125 mg (not administered)  acetaminophen (TYLENOL) tablet 1,000 mg (1,000 mg Oral Given 09/13/16 0940)  sodium chloride 0.9 % bolus 1,000 mL (0 mLs Intravenous Stopped 09/13/16 1129)  vancomycin (VANCOCIN) 50 mg/mL oral solution 125 mg (125 mg Oral Given 09/13/16 1110)  ceFEPIme (MAXIPIME) 2 g in dextrose 5 % 50 mL IVPB (0 g Intravenous Stopped 09/13/16 1124)  vancomycin (VANCOCIN) IVPB 1000 mg/200 mL premix (1,000 mg Intravenous New Bag/Given 09/13/16 1129)     Initial Impression / Assessment and Plan / ED Course  I have reviewed the triage vital signs and the nursing notes.  Pertinent labs & imaging results that were available during my care of the patient were reviewed by me and considered in my medical decision making (see chart for details).     81 year old male who presents with altered mental status. On arrival is febrile to 101.6. I does have a 2 L oxygen requirement as he was 87-88% on room air. There is clinical suspicion for sepsis due to healthcare associated pneumonia and he is covered empirically with vancomycin and cefepime. Oral vancomycin is also given for his known C.  difficile infection. Abdomen is soft and benign and I do not feel there is complication at this time. Blood work was normal lactic acid but he does have a leukocytosis 16. Also acute kidney injury is noted with creatinine of 1.75. He is given IV fluids, urinalysis is pending. Chest x-ray visualized and does not show infiltrate or other acute cardiopulmonary processes but clinically does appear to have potential pneumonia. Discussed with hospitalist service will admit for ongoing management.  Final Clinical Impressions(s) / ED Diagnoses   Final diagnoses:  Delirium  HCAP (healthcare-associated pneumonia)  Sepsis, due to unspecified organism [facility])    New Prescriptions New Prescriptions   No medications on file     Forde Dandy, MD 09/13/16 1233

## 2016-09-13 NOTE — Progress Notes (Signed)
Pt refusing use of flutter, O2 and IS at this time.  RT spoke with family, two daughters, and they state he will not use any of thew devices nor wear the o2 because he is 'stubborn".  Rt explained the benefits of usage, and there was concern of pt aspirating the contrast for CT.  Pt family states he was given the contrast while lying down and has been coughing ever sinve.  RT explained he should try and [place] ethe IS to help prevent PNA.  Rt will monitor.

## 2016-09-13 NOTE — ED Triage Notes (Signed)
Patient was ambulating around house at 0200 and confused per spouse. LKW 2300. Patient recently diagnosed with stage 3 bladder cancer and hasn't started tx. Also hx of c-diff. Today incontinent and oriented to person only. No arm drift. No facial droop. Skin warm to touch

## 2016-09-13 NOTE — Progress Notes (Signed)
Patient arrived to room 5w23 alert but oriented x3 , family at bedside with patient. IV fluids running, only complaint is patient is nauseated, will medicate per orders. Placed on enteric precautions. Oriented to room and staff, will continue to monitor.

## 2016-09-14 ENCOUNTER — Inpatient Hospital Stay (HOSPITAL_COMMUNITY): Payer: Medicare Other

## 2016-09-14 LAB — RESPIRATORY PANEL BY PCR
Adenovirus: NOT DETECTED
BORDETELLA PERTUSSIS-RVPCR: NOT DETECTED
CORONAVIRUS 229E-RVPPCR: NOT DETECTED
CORONAVIRUS HKU1-RVPPCR: NOT DETECTED
Chlamydophila pneumoniae: NOT DETECTED
Coronavirus NL63: NOT DETECTED
Coronavirus OC43: NOT DETECTED
Influenza A: NOT DETECTED
Influenza B: NOT DETECTED
METAPNEUMOVIRUS-RVPPCR: NOT DETECTED
MYCOPLASMA PNEUMONIAE-RVPPCR: NOT DETECTED
PARAINFLUENZA VIRUS 1-RVPPCR: NOT DETECTED
PARAINFLUENZA VIRUS 2-RVPPCR: NOT DETECTED
Parainfluenza Virus 3: NOT DETECTED
Parainfluenza Virus 4: NOT DETECTED
Respiratory Syncytial Virus: NOT DETECTED
Rhinovirus / Enterovirus: NOT DETECTED

## 2016-09-14 LAB — COMPREHENSIVE METABOLIC PANEL
ALBUMIN: 2.7 g/dL — AB (ref 3.5–5.0)
ALT: 18 U/L (ref 17–63)
AST: 18 U/L (ref 15–41)
Alkaline Phosphatase: 50 U/L (ref 38–126)
Anion gap: 9 (ref 5–15)
BILIRUBIN TOTAL: 1 mg/dL (ref 0.3–1.2)
BUN: 14 mg/dL (ref 6–20)
CO2: 23 mmol/L (ref 22–32)
CREATININE: 1.66 mg/dL — AB (ref 0.61–1.24)
Calcium: 7.4 mg/dL — ABNORMAL LOW (ref 8.9–10.3)
Chloride: 105 mmol/L (ref 101–111)
GFR calc Af Amer: 44 mL/min — ABNORMAL LOW (ref >60)
GFR, EST NON AFRICAN AMERICAN: 38 mL/min — AB (ref >60)
Glucose, Bld: 152 mg/dL — ABNORMAL HIGH (ref 65–99)
Potassium: 3.2 mmol/L — ABNORMAL LOW (ref 3.5–5.1)
SODIUM: 137 mmol/L (ref 135–145)
TOTAL PROTEIN: 5.7 g/dL — AB (ref 6.5–8.1)

## 2016-09-14 LAB — CBC
HCT: 36.8 % — ABNORMAL LOW (ref 39.0–52.0)
Hemoglobin: 11.9 g/dL — ABNORMAL LOW (ref 13.0–17.0)
MCH: 27.9 pg (ref 26.0–34.0)
MCHC: 32.3 g/dL (ref 30.0–36.0)
MCV: 86.2 fL (ref 78.0–100.0)
PLATELETS: 137 10*3/uL — AB (ref 150–400)
RBC: 4.27 MIL/uL (ref 4.22–5.81)
RDW: 14.4 % (ref 11.5–15.5)
WBC: 10.6 10*3/uL — AB (ref 4.0–10.5)

## 2016-09-14 LAB — URINE CULTURE: Culture: <10000 — AB

## 2016-09-14 MED ORDER — BENZONATATE 100 MG PO CAPS
200.0000 mg | ORAL_CAPSULE | Freq: Three times a day (TID) | ORAL | Status: DC
  Administered 2016-09-14 – 2016-09-18 (×13): 200 mg via ORAL
  Filled 2016-09-14 (×13): qty 2

## 2016-09-14 MED ORDER — METHOCARBAMOL 500 MG PO TABS: 500.0000 mg | ORAL_TABLET | Freq: Three times a day (TID) | ORAL | Status: DC | PRN

## 2016-09-14 MED ORDER — MENTHOL 3 MG MT LOZG
1.0000 | LOZENGE | OROMUCOSAL | Status: DC | PRN
Start: 2016-09-14 — End: 2016-09-18

## 2016-09-14 MED ORDER — GUAIFENESIN ER 600 MG PO TB12
600.0000 mg | ORAL_TABLET | Freq: Two times a day (BID) | ORAL | Status: DC
  Administered 2016-09-14 – 2016-09-18 (×9): 600 mg via ORAL
  Filled 2016-09-14 (×9): qty 1

## 2016-09-14 MED ORDER — METRONIDAZOLE IN NACL 5-0.79 MG/ML-% IV SOLN
500.0000 mg | Freq: Three times a day (TID) | INTRAVENOUS | Status: DC
  Administered 2016-09-14 – 2016-09-17 (×9): 500 mg via INTRAVENOUS
  Filled 2016-09-14 (×10): qty 100

## 2016-09-14 MED ORDER — TRAZODONE HCL 50 MG PO TABS
50.0000 mg | ORAL_TABLET | Freq: Every day | ORAL | Status: DC
  Administered 2016-09-14 – 2016-09-17 (×4): 50 mg via ORAL
  Filled 2016-09-14 (×4): qty 1

## 2016-09-14 MED ORDER — SODIUM CHLORIDE 0.9% FLUSH
10.0000 mL | INTRAVENOUS | Status: DC | PRN
  Administered 2016-09-18: 10 mL
  Filled 2016-09-14: qty 40

## 2016-09-14 MED ORDER — CYCLOBENZAPRINE HCL 5 MG PO TABS
5.0000 mg | ORAL_TABLET | Freq: Three times a day (TID) | ORAL | Status: DC | PRN
  Administered 2016-09-14: 5 mg via ORAL
  Filled 2016-09-14: qty 1

## 2016-09-14 MED ORDER — ENOXAPARIN SODIUM 60 MG/0.6ML ~~LOC~~ SOLN
55.0000 mg | SUBCUTANEOUS | Status: DC
  Administered 2016-09-14 – 2016-09-17 (×4): 55 mg via SUBCUTANEOUS
  Filled 2016-09-14 (×5): qty 0.6

## 2016-09-14 MED ORDER — GUAIFENESIN-DM 100-10 MG/5ML PO SYRP
5.0000 mL | ORAL_SOLUTION | ORAL | Status: DC | PRN
  Administered 2016-09-14 (×2): 5 mL via ORAL
  Filled 2016-09-14: qty 5

## 2016-09-14 MED ORDER — NAPROXEN 250 MG PO TABS
250.0000 mg | ORAL_TABLET | Freq: Once | ORAL | Status: AC
  Administered 2016-09-14: 250 mg via ORAL
  Filled 2016-09-14: qty 1

## 2016-09-14 NOTE — Progress Notes (Signed)
[facility] Progress Note  Patient: Bill Dunn HUT:[id number]   PCP: [name], MD DOB: 1935-05-14   DOA: 09/13/2016   DOS: 09/14/2016   Date of Service: the patient was seen and examined on 09/14/2016  Subjective: Was feeling better earlier in the morning but later on started having severe cough with yellow-white expectoration as well as persistent fever. No abdominal pain, nausea no vomiting.  Brief hospital course: Pt. with PMH of bladder cancer, Crohn's disease,CKD stage III, HTN, TIA, recent SBO and C. difficile; admitted on 09/13/2016, presented with complaint of confusion, was found to have sepsis. Currently further plan is continue IV antibiotics and follow cultures.  Assessment and Plan: 1. Sepsis. Etiology currently unclear, possible viral infection. Chest x-ray clear, no evidence of pneumonia. CT abdomen and pelvis without contrast clear without any evidence of acute abnormality. Urine culture shows less than 10,000 colonies of bacteria. Lactic acid normal. Leukocytosis normal. Next on patient persistently tachycardic tachypneic as well as having fever. May have wider illness, we'll check respiratory virus panel. Continue supportive treatment. Given the history of C. difficile with sepsis I would add Flagyl for 24-48 hours. If cultures remain negative we will likely discontinue IV antibiotics.  2. Acute encephalopathy. Pain control. Condition presented with confusion, currently getting better and at his baseline. Resuming his home psychotropic medications but at a lower dose. Concern regarding escalating his narcotic regimen given the presentation with confusion. Continue home dose.  3. Acute kidney injury on chronic kidney disease stage III. Patient presented with worsening of physical function likely contributing to his confusion. Patient was given IV fluid and renal function is at his baseline. Avoid nephrotoxic medication.  4. Crohn's disease. C.  difficile colitis. Continue oral vancomycin, and Flagyl. Avoiding steroids at present given presentation with sepsis. No evidence of flare up on CT scan. We'll monitor. We will likely discontinue Flagyl in 48 hours.  5. Dyslipidemia. Continuing home regimen at present.  6. Essential hypertension. Presented with sepsis, blood pressure currently stable. Next and continue when necessary hydralazine, holding home antihypertensive medications.  Diet: Cardiac diet DVT Prophylaxis: subcutaneous Heparin  Advance goals of care discussion: DNR DNI  Family Communication: family was present at bedside, at the time of interview. The pt provided permission to discuss medical plan with the family. Opportunity was given to ask question and all questions were answered satisfactorily.   Disposition:  Discharge to home.  Consultants: none Procedures: none  Antibiotics: Anti-infectives    Start     Dose/Rate Route Frequency Ordered Stop   09/14/16 1600  metroNIDAZOLE (FLAGYL) IVPB 500 mg     500 mg 100 mL/hr over 60 Minutes Intravenous Every 8 hours 09/14/16 1510     09/14/16 1200  vancomycin (VANCOCIN) 1,250 mg in sodium chloride 0.9 % 250 mL IVPB     1,250 mg 166.7 mL/hr over 90 Minutes Intravenous Every 24 hours 09/13/16 1317     09/13/16 2200  ceFEPIme (MAXIPIME) 2 g in dextrose 5 % 50 mL IVPB     2 g 100 mL/hr over 30 Minutes Intravenous Every 12 hours 09/13/16 1429     09/13/16 1800  vancomycin (VANCOCIN) 50 mg/mL oral solution 125 mg     125 mg Oral Every 6 hours 09/13/16 1025     09/13/16 1315  ceFEPIme (MAXIPIME) 2 g in dextrose 5 % 50 mL IVPB  Status:  Discontinued     2 g 100 mL/hr over 30 Minutes Intravenous Every 12 hours 09/13/16 1311 09/13/16 1429  09/13/16 1300  vancomycin (VANCOCIN) IVPB 1000 mg/200 mL premix     1,000 mg 200 mL/hr over 60 Minutes Intravenous NOW 09/13/16 1259 09/13/16 1445   09/13/16 1030  vancomycin (VANCOCIN) 50 mg/mL oral solution 125 mg     125 mg  Oral  Once 09/13/16 1027 09/13/16 1110   09/13/16 1030  ceFEPIme (MAXIPIME) 2 g in dextrose 5 % 50 mL IVPB     2 g 100 mL/hr over 30 Minutes Intravenous  Once 09/13/16 1028 09/13/16 1124   09/13/16 1030  vancomycin (VANCOCIN) IVPB 1000 mg/200 mL premix     1,000 mg 200 mL/hr over 60 Minutes Intravenous  Once 09/13/16 1028 09/13/16 1229       Objective: Physical Exam: Vitals:   09/14/16 1246 09/14/16 1350 09/14/16 1356 09/14/16 1428  BP:   (!) 125/57   Pulse:   (!) 102   Resp:   20   Temp:  (!) 101.4 F (38.6 C)  (!) 102.4 F (39.1 C)  TempSrc:  Oral  Oral  SpO2: 96%  92%   Weight:      Height:        Intake/Output Summary (Last 24 hours) at 09/14/16 1632 Last data filed at 09/14/16 1500  Gross per 24 hour  Intake           2212.5 ml  Output              700 ml  Net           1512.5 ml   Filed Weights   09/13/16 2213 09/14/16 0559  Weight: 115.6 kg (254 lb 13.6 oz) 114.4 kg (252 lb 1.6 oz)   General: Alert, Awake and Oriented to Time, Place and Person. Appear in moderate distress, affect appropriate Eyes: PERRL, Conjunctiva normal ENT: Oral Mucosa clear moist. Neck: difficult to assess JVD, no Abnormal Mass Or lumps Cardiovascular: S1 and S2 Present, no Murmur, Respiratory: Bilateral Air entry equal and Decreased, no use of accessory muscle, no Crackles, no wheezes Abdomen: Bowel Sound present, Soft and no tenderness Skin: no redness, no Rash, no induration Extremities: no Pedal edema, no calf tenderness Neurologic: Grossly no focal neuro deficit. Bilaterally Equal motor strength  Data Reviewed: CBC:  Recent Labs Lab 09/08/16 0522 09/09/16 0431 09/13/16 0930 09/14/16 1211  WBC 5.0 5.3 16.3* 10.6*  NEUTROABS  --  2.5 12.8*  --   HGB 12.8* 12.6* 13.8 11.9*  HCT 39.1 39.1 40.7 36.8*  MCV 85.7 85.6 85.0 86.2  PLT 128* 145* 174 433*   Basic Metabolic Panel:  Recent Labs Lab 09/08/16 0522 09/09/16 0431 09/13/16 0930 09/13/16 1751 09/14/16 1211  NA 137  136 136  --  137  K 3.5 3.6 3.2*  --  3.2*  CL 103 101 103  --  105  CO2 25 26 21*  --  23  GLUCOSE 116* 113* 135*  --  152*  BUN 12 10 14   --  14  CREATININE 1.33* 1.27* 1.75*  --  1.66*  CALCIUM 8.2* 8.1* 8.2*  --  7.4*  MG 2.0  --   --  1.2*  --   PHOS  --   --   --  3.6  --     Liver Function Tests:  Recent Labs Lab 09/13/16 0930 09/14/16 1211  AST 22 18  ALT 23 18  ALKPHOS 64 50  BILITOT 1.2 1.0  PROT 6.8 5.7*  ALBUMIN 3.3* 2.7*   No results for input(s): LIPASE,  AMYLASE in the last 168 hours. No results for input(s): AMMONIA in the last 168 hours. Coagulation Profile: No results for input(s): INR, PROTIME in the last 168 hours. Cardiac Enzymes:  Recent Labs Lab 09/13/16 1751  TROPONINI <0.03   BNP (last 3 results) No results for input(s): PROBNP in the last 8760 hours. CBG:  Recent Labs Lab 09/07/16 1712 09/08/16 0745 09/09/16 0755 09/10/16 0729  GLUCAP 130* 113* 121* 111*   Studies: Ct Abdomen Pelvis Wo Contrast  Result Date: 09/13/2016 CLINICAL DATA:  81 year old male with diarrhea and sepsis. History of chronic kidney disease and Crohn's disease. EXAM: CT ABDOMEN AND PELVIS WITHOUT CONTRAST TECHNIQUE: Multidetector CT imaging of the abdomen and pelvis was performed following the standard protocol without IV contrast. COMPARISON:  09/04/2016 CT FINDINGS: Please note that parenchymal abnormalities may be missed without intravenous contrast. Lower chest: Mild bibasilar atelectasis/ scarring again noted. Cardiomegaly and coronary artery disease again identified. Hepatobiliary: The liver is unremarkable. The patient is status post cholecystectomy. Pancreas: Unremarkable Spleen: Unremarkable Adrenals/Urinary Tract: Bilateral renal cortical atrophy identified. Multiple bilateral renal lesions are identified, most representing cysts but some are indeterminate. There is no evidence of hydronephrosis or urinary calculi. The adrenal glands are unremarkable. No  definite bladder abnormality noted. Stomach/Bowel: No definite bowel wall thickening identified. There is no evidence of bowel dilatation or pneumoperitoneum. Vascular/Lymphatic: Aorto bi-iliac endograft again identified. No enlarged lymph nodes noted. Reproductive: Prostate within normal limits. Other: No ascites or focal collection. Musculoskeletal: No acute abnormalities or suspicious focal bony lesions. Left hip arthroplasty and screws within the proximal right femur noted. IMPRESSION: No evidence of acute abnormality. No acute bowel abnormalities identified. Cardiomegaly, coronary disease and aortic graft. Bilateral renal lesions again identified, most which represent cysts but some indeterminate. Electronically Signed   By: Margarette Canada M.D.   On: 09/13/2016 19:41   Dg Chest Port 1 View  Result Date: 09/14/2016 CLINICAL DATA:  Clostridium difficile diarrhea EXAM: PORTABLE CHEST 1 VIEW COMPARISON:  09/13/2016 FINDINGS: Mild cardiac enlargement without heart failure. Lungs remain clear without infiltrate or effusion. Port-A-Cath tip in the right atrium, unchanged in position. IMPRESSION: No active disease. Electronically Signed   By: Franchot Gallo M.D.   On: 09/14/2016 07:28    Scheduled Meds: . atorvastatin  80 mg Oral QHS  . benzonatate  200 mg Oral TID  . colestipol  1 g Oral BID  . enoxaparin (LOVENOX) injection  55 mg Subcutaneous Q24H  . guaiFENesin  600 mg Oral BID  . ipratropium-albuterol  3 mL Nebulization BID  . naproxen  250 mg Oral Once  . traZODone  50 mg Oral QHS  . vancomycin  125 mg Oral Q6H   Continuous Infusions: . ceFEPime (MAXIPIME) IV Stopped (09/14/16 1022)  . metronidazole    . vancomycin Stopped (09/14/16 1258)   PRN Meds: acetaminophen **OR** acetaminophen, Culture, expectorated sputum-assessment **AND** Flutter valve **AND** Incentive spirometry RT **AND** albuterol **AND** ipratropium-albuterol, cyclobenzaprine, guaiFENesin-dextromethorphan, hydrALAZINE,  HYDROcodone-acetaminophen, menthol-cetylpyridinium, ondansetron **OR** ondansetron (ZOFRAN) IV, sodium chloride flush  Time spent: 35 minutes  Author: Berle Mull, MD [facility] Pager: [phone] 09/14/2016 4:32 PM  If 7PM-7AM, please contact night-coverage at [email or web address], password [facility]

## 2016-09-14 NOTE — Progress Notes (Signed)
Pt refusing CPT/Flutter valve at this time.  RT to monitor and assess as needed. 

## 2016-09-15 ENCOUNTER — Inpatient Hospital Stay (HOSPITAL_COMMUNITY): Payer: Medicare Other

## 2016-09-15 LAB — BASIC METABOLIC PANEL
Anion gap: 10 (ref 5–15)
BUN: 11 mg/dL (ref 6–20)
CALCIUM: 7.5 mg/dL — AB (ref 8.9–10.3)
CO2: 22 mmol/L (ref 22–32)
Chloride: 106 mmol/L (ref 101–111)
Creatinine, Ser: 1.69 mg/dL — ABNORMAL HIGH (ref 0.61–1.24)
GFR calc Af Amer: 43 mL/min — ABNORMAL LOW (ref >60)
GFR calc non Af Amer: 37 mL/min — ABNORMAL LOW (ref >60)
GLUCOSE: 126 mg/dL — AB (ref 65–99)
POTASSIUM: 2.9 mmol/L — AB (ref 3.5–5.1)
SODIUM: 138 mmol/L (ref 135–145)

## 2016-09-15 LAB — CBC
HCT: 35.9 % — ABNORMAL LOW (ref 39.0–52.0)
Hemoglobin: 11.4 g/dL — ABNORMAL LOW (ref 13.0–17.0)
MCH: 27.1 pg (ref 26.0–34.0)
MCHC: 31.8 g/dL (ref 30.0–36.0)
MCV: 85.3 fL (ref 78.0–100.0)
Platelets: 134 10*3/uL — ABNORMAL LOW (ref 150–400)
RBC: 4.21 MIL/uL — ABNORMAL LOW (ref 4.22–5.81)
RDW: 14.3 % (ref 11.5–15.5)
WBC: 6.8 10*3/uL (ref 4.0–10.5)

## 2016-09-15 MED ORDER — OXYCODONE HCL 5 MG PO TABS
5.0000 mg | ORAL_TABLET | ORAL | Status: DC | PRN
  Administered 2016-09-15 – 2016-09-18 (×11): 5 mg via ORAL
  Filled 2016-09-15 (×11): qty 1

## 2016-09-15 MED ORDER — POTASSIUM CHLORIDE CRYS ER 20 MEQ PO TBCR
40.0000 meq | EXTENDED_RELEASE_TABLET | ORAL | Status: AC
  Administered 2016-09-15 (×2): 40 meq via ORAL
  Filled 2016-09-15 (×2): qty 2

## 2016-09-15 MED ORDER — IOPAMIDOL (ISOVUE-300) INJECTION 61%
INTRAVENOUS | Status: AC
  Filled 2016-09-15: qty 30

## 2016-09-15 MED ORDER — VANCOMYCIN 50 MG/ML ORAL SOLUTION
125.0000 mg | Freq: Four times a day (QID) | ORAL | Status: DC
  Administered 2016-09-15 – 2016-09-18 (×13): 125 mg via ORAL
  Filled 2016-09-15 (×16): qty 2.5

## 2016-09-15 NOTE — Progress Notes (Signed)
Potassium level 2.9 this morning. Paged Dr. Posey Pronto to notify.

## 2016-09-15 NOTE — Progress Notes (Signed)
[facility] Progress Note  Patient: Bill Dunn WGN:[id number]   PCP: [name], MD DOB: [birth year]   DOA: 09/13/2016   DOS: 09/15/2016   Date of Service: the patient was seen and examined on 09/15/2016  Subjective: Was feeling better earlier in the morning. No acute complaint. Cough is getting better. Later on in the afternoon the patient reported to obtain that he is having abdominal pain as well as distention. Has been passing gas as well as having bowel movements. No nausea no vomiting.  Brief hospital course: Pt. with PMH of bladder cancer, Crohn's disease,CKD stage III, HTN, TIA, recent SBO and C. difficile; admitted on 09/13/2016, presented with complaint of confusion, was found to have sepsis. Currently further plan is continue IV antibiotics and follow cultures.  Assessment and Plan: 1. Sepsis. C. difficile colitis Chest x-ray clear, no evidence of pneumonia. CT abdomen and pelvis without contrast clear without any evidence of acute abnormality.  No evidence of colitis as well. Urine culture shows less than 10,000 colonies of bacteria.  Lactic acid normal. Leukocytosis normal.  patient persistently tachycardic tachypneic as well as having fever. Negative respiratory virus panel. Continue supportive treatment. Given the history of C. difficile with sepsis I would add Flagyl. Negative blood culture for 48 hours, stop IV Antibiotics as it can worsen C diff.   2. Acute encephalopathy. Pain control. presented with confusion, currently getting better and at his baseline. Resuming his home psychotropic medications but at a lower dose. Concern regarding escalating his narcotic regimen given the presentation with confusion. Continue home dose.  3. Acute kidney injury on chronic kidney disease stage III. Patient presented with worsening of renal function likely contributing to his confusion. Patient was given IV fluid and renal function is at his baseline. Avoid  nephrotoxic medication.  4. Crohn's disease. C. difficile colitis. Continue oral vancomycin, and Flagyl. Avoiding steroids at present given presentation with sepsis. No evidence of flare up on CT scan. We'll monitor. We will likely discontinue Flagyl in 48 hours.  5.abdominal pain with distension  X ray abdomen on 05/15 shows partial obstruction Will treat conservatively with NPO, get CT abdomen, may need ID and surgery if abnormality present due to CKD diff.  6. Essential hypertension. Presented with sepsis, blood pressure currently stable.  continue when necessary hydralazine, holding home antihypertensive medications.  7. Dyslipidemia. Continuing home regimen at present.  Diet: NPO DVT Prophylaxis: subcutaneous Heparin  Advance goals of care discussion: DNR DNI  Family Communication: family was present at bedside, at the time of interview. The pt provided permission to discuss medical plan with the family. Opportunity was given to ask question and all questions were answered satisfactorily.   Disposition:  Discharge to SNF.  Consultants: none Procedures: none  Antibiotics: Anti-infectives    Start     Dose/Rate Route Frequency Ordered Stop   09/15/16 1200  vancomycin (VANCOCIN) 50 mg/mL oral solution 125 mg     125 mg Oral Every 6 hours 09/15/16 1007     09/14/16 1600  metroNIDAZOLE (FLAGYL) IVPB 500 mg     500 mg 100 mL/hr over 60 Minutes Intravenous Every 8 hours 09/14/16 1510     09/14/16 1200  vancomycin (VANCOCIN) 1,250 mg in sodium chloride 0.9 % 250 mL IVPB  Status:  Discontinued     1,250 mg 166.7 mL/hr over 90 Minutes Intravenous Every 24 hours 09/13/16 1317 09/15/16 1007   09/13/16 2200  ceFEPIme (MAXIPIME) 2 g in dextrose 5 % 50 mL IVPB  Status:  Discontinued     2 g 100 mL/hr over 30 Minutes Intravenous Every 12 hours 09/13/16 1429 09/15/16 1007   09/13/16 1800  vancomycin (VANCOCIN) 50 mg/mL oral solution 125 mg  Status:  Discontinued     125 mg Oral  Every 6 hours 09/13/16 1025 09/15/16 1007   09/13/16 1315  ceFEPIme (MAXIPIME) 2 g in dextrose 5 % 50 mL IVPB  Status:  Discontinued     2 g 100 mL/hr over 30 Minutes Intravenous Every 12 hours 09/13/16 1311 09/13/16 1429   09/13/16 1300  vancomycin (VANCOCIN) IVPB 1000 mg/200 mL premix     1,000 mg 200 mL/hr over 60 Minutes Intravenous NOW 09/13/16 1259 09/13/16 1445   09/13/16 1030  vancomycin (VANCOCIN) 50 mg/mL oral solution 125 mg     125 mg Oral  Once 09/13/16 1027 09/13/16 1110   09/13/16 1030  ceFEPIme (MAXIPIME) 2 g in dextrose 5 % 50 mL IVPB     2 g 100 mL/hr over 30 Minutes Intravenous  Once 09/13/16 1028 09/13/16 1124   09/13/16 1030  vancomycin (VANCOCIN) IVPB 1000 mg/200 mL premix     1,000 mg 200 mL/hr over 60 Minutes Intravenous  Once 09/13/16 1028 09/13/16 1229       Objective: Physical Exam: Vitals:   09/14/16 2150 09/15/16 0541 09/15/16 0542 09/15/16 1530  BP: (!) 110/50  (!) 145/67 (!) 144/75  Pulse: 77  83 77  Resp: 18  18   Temp: 99 F (37.2 C)  98.4 F (36.9 C) 99.2 F (37.3 C)  TempSrc: Oral     SpO2: 95%  97% 96%  Weight:  117.6 kg (259 lb 3.2 oz)    Height:        Intake/Output Summary (Last 24 hours) at 09/15/16 1926 Last data filed at 09/15/16 1322  Gross per 24 hour  Intake             1090 ml  Output             1175 ml  Net              -85 ml   Filed Weights   09/13/16 2213 09/14/16 0559 09/15/16 0541  Weight: 115.6 kg (254 lb 13.6 oz) 114.4 kg (252 lb 1.6 oz) 117.6 kg (259 lb 3.2 oz)   General: Alert, Awake and Oriented to Time, Place and Person. Appear in mild distress, affect appropriate Eyes: PERRL, Conjunctiva normal ENT: Oral Mucosa clear moist. Neck: difficult to assess JVD, no Abnormal Mass Or lumps Cardiovascular: S1 and S2 Present, no Murmur, Respiratory: Bilateral Air entry equal and Decreased, no use of accessory muscle, Clear to Auscultation, no Crackles, no wheezes Abdomen: Bowel Sound present, Soft and no  tenderness Skin: no redness, no Rash, no induration Extremities: trace Pedal edema, no calf tenderness Neurologic: Grossly no focal neuro deficit. Bilaterally Equal motor strength  Data Reviewed: CBC:  Recent Labs Lab 09/09/16 0431 09/13/16 0930 09/14/16 1211 09/15/16 0830  WBC 5.3 16.3* 10.6* 6.8  NEUTROABS 2.5 12.8*  --   --   HGB 12.6* 13.8 11.9* 11.4*  HCT 39.1 40.7 36.8* 35.9*  MCV 85.6 85.0 86.2 85.3  PLT 145* 174 137* 841*   Basic Metabolic Panel:  Recent Labs Lab 09/09/16 0431 09/13/16 0930 09/13/16 1751 09/14/16 1211 09/15/16 0830  NA 136 136  --  137 138  K 3.6 3.2*  --  3.2* 2.9*  CL 101 103  --  105 106  CO2 26 21*  --  23 22  GLUCOSE 113* 135*  --  152* 126*  BUN 10 14  --  14 11  CREATININE 1.27* 1.75*  --  1.66* 1.69*  CALCIUM 8.1* 8.2*  --  7.4* 7.5*  MG  --   --  1.2*  --   --   PHOS  --   --  3.6  --   --     Liver Function Tests:  Recent Labs Lab 09/13/16 0930 09/14/16 1211  AST 22 18  ALT 23 18  ALKPHOS 64 50  BILITOT 1.2 1.0  PROT 6.8 5.7*  ALBUMIN 3.3* 2.7*   No results for input(s): LIPASE, AMYLASE in the last 168 hours. No results for input(s): AMMONIA in the last 168 hours. Coagulation Profile: No results for input(s): INR, PROTIME in the last 168 hours. Cardiac Enzymes:  Recent Labs Lab 09/13/16 1751  TROPONINI <0.03   BNP (last 3 results) No results for input(s): PROBNP in the last 8760 hours. CBG:  Recent Labs Lab 09/09/16 0755 09/10/16 0729  GLUCAP 121* 111*   Studies: Dg Abd Portable 1v  Result Date: 09/15/2016 CLINICAL DATA:  Onset of abdominal pain and distension this morning. EXAM: PORTABLE ABDOMEN - 1 VIEW COMPARISON:  Abdominal CT scan of Sep 13, 2016 FINDINGS: There are small amounts of gas within minimally distended small bowel loops. There is gas in the stomach as well as in the right colon. No free extraluminal gas is observed. Numerous urgent surgical clips and wire sutures are noted in the right  aspect of the abdomen. Metallic coils are present in the region of the internal iliac vessel on the left. There is an aorto bi-iliac stent graft in place as well as an additional right-sided iliac branch graft. The bony structures exhibit no acute abnormality. IMPRESSION: Gas within minimally distended small bowel loops may reflect ileus or early partial obstruction. No evidence of perforation. Electronically Signed   By: David  Martinique M.D.   On: 09/15/2016 16:08    Scheduled Meds: . atorvastatin  80 mg Oral QHS  . benzonatate  200 mg Oral TID  . colestipol  1 g Oral BID  . enoxaparin (LOVENOX) injection  55 mg Subcutaneous Q24H  . guaiFENesin  600 mg Oral BID  . ipratropium-albuterol  3 mL Nebulization BID  . traZODone  50 mg Oral QHS  . vancomycin  125 mg Oral Q6H   Continuous Infusions: . metronidazole Stopped (09/15/16 1746)   PRN Meds: acetaminophen **OR** acetaminophen, Culture, expectorated sputum-assessment **AND** Flutter valve **AND** Incentive spirometry RT **AND** albuterol **AND** ipratropium-albuterol, cyclobenzaprine, guaiFENesin-dextromethorphan, hydrALAZINE, menthol-cetylpyridinium, ondansetron **OR** ondansetron (ZOFRAN) IV, oxyCODONE, sodium chloride flush  Time spent: 35 minutes  Author: Berle Mull, MD [facility] Pager: [phone] 09/15/2016 7:26 PM  If 7PM-7AM, please contact night-coverage at [email or web address], password [facility]

## 2016-09-15 NOTE — Progress Notes (Signed)
PT Cancellation Note  Patient Details Name: Bill Dunn MRN: [id number] DOB: 1935-05-10   Cancelled Treatment:    Reason Eval/Treat Not Completed: Medical issues which prohibited therapy (pt not able to tolerate activity 2* nausea, abdominal discomfort and distension. Will follow. )   Philomena Doheny 09/15/2016, 2:13 PM [phone]

## 2016-09-16 ENCOUNTER — Inpatient Hospital Stay (HOSPITAL_COMMUNITY): Payer: Medicare Other

## 2016-09-16 DIAGNOSIS — N179 Acute kidney failure, unspecified: Secondary | ICD-10-CM

## 2016-09-16 DIAGNOSIS — N189 Chronic kidney disease, unspecified: Secondary | ICD-10-CM

## 2016-09-16 DIAGNOSIS — E876 Hypokalemia: Secondary | ICD-10-CM

## 2016-09-16 DIAGNOSIS — A0472 Enterocolitis due to Clostridium difficile, not specified as recurrent: Secondary | ICD-10-CM

## 2016-09-16 DIAGNOSIS — I471 Supraventricular tachycardia: Secondary | ICD-10-CM

## 2016-09-16 LAB — LACTIC ACID, PLASMA: Lactic Acid, Venous: 0.9 mmol/L (ref 0.5–1.9)

## 2016-09-16 LAB — CBC WITH DIFFERENTIAL/PLATELET
Basophils Absolute: 0 10*3/uL (ref 0.0–0.1)
Basophils Relative: 0 %
Eosinophils Absolute: 0.1 10*3/uL (ref 0.0–0.7)
Eosinophils Relative: 2 %
HEMATOCRIT: 35 % — AB (ref 39.0–52.0)
HEMOGLOBIN: 11.7 g/dL — AB (ref 13.0–17.0)
LYMPHS ABS: 1.4 10*3/uL (ref 0.7–4.0)
LYMPHS PCT: 24 %
MCH: 28.3 pg (ref 26.0–34.0)
MCHC: 33.4 g/dL (ref 30.0–36.0)
MCV: 84.7 fL (ref 78.0–100.0)
MONO ABS: 0.7 10*3/uL (ref 0.1–1.0)
MONOS PCT: 13 %
NEUTROS ABS: 3.5 10*3/uL (ref 1.7–7.7)
Neutrophils Relative %: 61 %
Platelets: 143 10*3/uL — ABNORMAL LOW (ref 150–400)
RBC: 4.13 MIL/uL — ABNORMAL LOW (ref 4.22–5.81)
RDW: 14.6 % (ref 11.5–15.5)
WBC: 5.7 10*3/uL (ref 4.0–10.5)

## 2016-09-16 LAB — COMPREHENSIVE METABOLIC PANEL
ALK PHOS: 60 U/L (ref 38–126)
ALT: 27 U/L (ref 17–63)
ANION GAP: 8 (ref 5–15)
AST: 32 U/L (ref 15–41)
Albumin: 2.6 g/dL — ABNORMAL LOW (ref 3.5–5.0)
BILIRUBIN TOTAL: 1.1 mg/dL (ref 0.3–1.2)
BUN: 8 mg/dL (ref 6–20)
CALCIUM: 7.7 mg/dL — AB (ref 8.9–10.3)
CO2: 23 mmol/L (ref 22–32)
Chloride: 107 mmol/L (ref 101–111)
Creatinine, Ser: 1.47 mg/dL — ABNORMAL HIGH (ref 0.61–1.24)
GFR, EST AFRICAN AMERICAN: 51 mL/min — AB (ref >60)
GFR, EST NON AFRICAN AMERICAN: 44 mL/min — AB (ref >60)
Glucose, Bld: 116 mg/dL — ABNORMAL HIGH (ref 65–99)
POTASSIUM: 3.2 mmol/L — AB (ref 3.5–5.1)
Sodium: 138 mmol/L (ref 135–145)
Total Protein: 5.5 g/dL — ABNORMAL LOW (ref 6.5–8.1)

## 2016-09-16 LAB — CULTURE, RESPIRATORY W GRAM STAIN: Culture: NORMAL

## 2016-09-16 LAB — MAGNESIUM: MAGNESIUM: 1.1 mg/dL — AB (ref 1.7–2.4)

## 2016-09-16 MED ORDER — MONTELUKAST SODIUM 10 MG PO TABS
10.0000 mg | ORAL_TABLET | Freq: Every day | ORAL | Status: DC
  Administered 2016-09-16 – 2016-09-17 (×2): 10 mg via ORAL
  Filled 2016-09-16 (×2): qty 1

## 2016-09-16 MED ORDER — MAGNESIUM SULFATE 4 GM/100ML IV SOLN
4.0000 g | Freq: Once | INTRAVENOUS | Status: AC
  Administered 2016-09-16: 4 g via INTRAVENOUS
  Filled 2016-09-16: qty 100

## 2016-09-16 MED ORDER — POTASSIUM CHLORIDE CRYS ER 20 MEQ PO TBCR
40.0000 meq | EXTENDED_RELEASE_TABLET | ORAL | Status: AC
  Administered 2016-09-16 (×2): 40 meq via ORAL
  Filled 2016-09-16 (×2): qty 2

## 2016-09-16 MED ORDER — METOPROLOL SUCCINATE ER 50 MG PO TB24
50.0000 mg | ORAL_TABLET | Freq: Every day | ORAL | Status: DC
  Administered 2016-09-16: 50 mg via ORAL
  Filled 2016-09-16: qty 1

## 2016-09-16 MED ORDER — SACCHAROMYCES BOULARDII 250 MG PO CAPS
250.0000 mg | ORAL_CAPSULE | Freq: Two times a day (BID) | ORAL | Status: DC
  Administered 2016-09-16 – 2016-09-18 (×4): 250 mg via ORAL
  Filled 2016-09-16 (×4): qty 1

## 2016-09-16 NOTE — Progress Notes (Signed)
At [place] call this RN to inform that the pt's HR was sustaining in the 180s-190s. RN went to room and he stated "I feel like my heart is beating really fast". Rechecked HR via monitor and was back down to 77. Pt did not have any complaints of chest pain. VSS. Schorr, NP paged. Will continue to monitor pt.

## 2016-09-16 NOTE — Progress Notes (Signed)
PROGRESS NOTE   Bill Dunn  KGM:[id number]    DOB: [birth year]    DOA: 09/13/2016  PCP: [name], MD   I have briefly reviewed patients previous medical records in [facility].  Brief Narrative:  81 year old male with PMH of Crohn's disease status post multiple bowel resections and Port-A-Cath ([facility]), stage III chronic kidney disease, AAA status post stent grafting 2012, bladder cancer, HTN, hypertriglyceridemia, morbid obesity, SVT, TIA, recent hospitalization 5/4-5/10 for SBO that resolved with conservative treatment and C. difficile diarrhea-discharged on oral vancomycin, presented with confusion and sepsis-like picture.   Assessment & Plan:   Principal Problem:   Sepsis with multi-organ dysfunction (HCC) Active Problems:   HTN (hypertension)   Gastroesophageal reflux disease without esophagitis   CKD (chronic kidney disease), stage III   C. difficile diarrhea   Sepsis (HCC)   1. C. difficile colitis: Continue oral vancomycin. IV Flagyl was empirically added-? Need to continue. Add probiotics. Ongoing abdominal pain and if does not improve then consider GI consultation given underlying history of Crohn's disease. 2. Sepsis: WBC 16.3 on admission. Lactate normal. RSV panel negative. Sputum culture: Normal OP flora. Urine culture showed insignificant growth.blood cultures 2: Negative to date. CT of abdomen and pelvis 5/13: No evidence of acute abnormalities. Repeat CT abdomen 5/16: No acute abnormalities or small bowel obstruction. Mild wall thickening of the rectum without inflammatory stranding.? All related to C. difficile colitis. Empirically started broad-spectrum IV antibiotics were discontinued especially due to concern for worsening C. difficile. Sepsis physiology improved. 3. Acute encephalopathy: Presented with confusion. Home psychotropic medications were resumed at lower dose. Judicious use of opioids. Mental status changes seem to have  resolved. 4. Acute on stage III chronic kidney disease: Creatinine continues to improve. Follow BMP 5. Crohn's disease: Gives history of chronic intermittent loose stools. Discussion as above, if does not improve then GI consultation. 6. Abdominal pain: Likely related to problem #1. CT abdomen 5/15 without acute findings. Start clear liquids and advance diet as tolerated. 7. Essential hypertension: Controlled. Holding home antihypertensives. When necessary hydralazine. 8. Dyslipidemia: Statins. 9. Multiple exophytic renal lesions, seen on CT abdomen: Outpatient follow-up as deemed necessary. 10. Anemia and thrombocytopenia: Stable. Follow CBCs 11. Hypokalemia: Replace aggressively and follow. 12. Hypomagnesemia: Replace aggressively and follow. 13. NSSVT: Resume reduced dose of metoprolol XL. Replace potassium and magnesium. Monitor on telemetry.   DVT prophylaxis: Subcutaneous heparin Code Status: DO NOT RESUSCITATE Family Communication: None at bedside Disposition: DC home when medically improved and stable   Consultants:  None   Procedures:  None  Antimicrobials:  Oral vancomycin IV Flagyl    Subjective: Reports ongoing mostly right-sided abdominal pain, moderate and intermittent up to 4-5 episodes per hour lasting a minute or 2. Reported 5-6 episodes of watery diarrhea yesterday without blood or mucus. No nausea or vomiting. Patient reported that her abdominal pain yesterday got worse after eating.  ROS: No confusion, chest pain or dyspnea.  Objective:  Vitals:   09/15/16 2152 09/16/16 0625 09/16/16 0748 09/16/16 1446  BP: (!) 123/56 (!) 137/58  (!) 144/78  Pulse: 87 77  76  Resp: 18 18  20   Temp: 98.3 F (36.8 C) 98.1 F (36.7 C)  98.5 F (36.9 C)  TempSrc:  Oral  Oral  SpO2: 97% 95% 96% 97%  Weight:  117.7 kg (259 lb 6.6 oz)    Height:        Examination:  General exam: Pleasant elderly male, morbidly obese, lying comfortably propped up  in bed. Respiratory  system: Clear to auscultation. Respiratory effort normal. Port-A-Cath +. Cardiovascular system: S1 & S2 heard, RRR. No JVD, murmurs, rubs, gallops or clicks. No pedal edema. Telemetry: Sinus rhythm. An episode of nonsustained SVT of 150 bpm at approximately 6:30 AM on 5/16 Gastrointestinal system: Abdomen is nondistended/obese, diffuse mild tenderness without rigidity, guarding or rebound, soft and nontender. No organomegaly or masses felt. Normal bowel sounds heard. Central nervous system: Alert and oriented. No focal neurological deficits. Extremities: Symmetric 5 x 5 power. Skin: No rashes, lesions or ulcers Psychiatry: Judgement and insight appear normal. Mood & affect appropriate.     Data Reviewed: I have personally reviewed following labs and imaging studies  CBC:  Recent Labs Lab 09/13/16 0930 09/14/16 1211 09/15/16 0830 09/16/16 0432  WBC 16.3* 10.6* 6.8 5.7  NEUTROABS 12.8*  --   --  3.5  HGB 13.8 11.9* 11.4* 11.7*  HCT 40.7 36.8* 35.9* 35.0*  MCV 85.0 86.2 85.3 84.7  PLT 174 137* 134* 740*   Basic Metabolic Panel:  Recent Labs Lab 09/13/16 0930 09/13/16 1751 09/14/16 1211 09/15/16 0830 09/16/16 0432  NA 136  --  137 138 138  K 3.2*  --  3.2* 2.9* 3.2*  CL 103  --  105 106 107  CO2 21*  --  23 22 23   GLUCOSE 135*  --  152* 126* 116*  BUN 14  --  14 11 8   CREATININE 1.75*  --  1.66* 1.69* 1.47*  CALCIUM 8.2*  --  7.4* 7.5* 7.7*  MG  --  1.2*  --   --  1.1*  PHOS  --  3.6  --   --   --    Liver Function Tests:  Recent Labs Lab 09/13/16 0930 09/14/16 1211 09/16/16 0432  AST 22 18 32  ALT 23 18 27   ALKPHOS 64 50 60  BILITOT 1.2 1.0 1.1  PROT 6.8 5.7* 5.5*  ALBUMIN 3.3* 2.7* 2.6*   Cardiac Enzymes:  Recent Labs Lab 09/13/16 1751  TROPONINI <0.03   CBG:  Recent Labs Lab 09/10/16 0729  GLUCAP 111*    Recent Results (from the past 240 hour(s))  C difficile quick scan w PCR reflex     Status: Abnormal   Collection Time: 09/08/16 10:54 PM   Result Value Ref Range Status   C Diff antigen POSITIVE (A) NEGATIVE Final   C Diff toxin NEGATIVE NEGATIVE Final   C Diff interpretation Results are indeterminate. See PCR results.  Final  Clostridium Difficile by PCR     Status: Abnormal   Collection Time: 09/08/16 10:54 PM  Result Value Ref Range Status   Toxigenic C Difficile by pcr POSITIVE (A) NEGATIVE Final    Comment: Positive for toxigenic C. difficile with little to no toxin production. Only treat if clinical presentation suggests symptomatic illness. RESULT CALLED TO, READ BACK BY AND VERIFIED WITH: Karin Golden RN, AT [phone] 09/09/16 BY D.VANHOOK   Blood culture (routine x 2)     Status: None (Preliminary result)   Collection Time: 09/13/16  9:28 AM  Result Value Ref Range Status   Specimen Description BLOOD PORTA CATH  Final   Special Requests   Final    BOTTLES DRAWN AEROBIC AND ANAEROBIC Blood Culture adequate volume   Culture NO GROWTH 3 DAYS  Final   Report Status PENDING  Incomplete  Blood culture (routine x 2)     Status: None (Preliminary result)   Collection Time: 09/13/16 10:00 AM  Result Value Ref Range Status   Specimen Description BLOOD RIGHT ANTECUBITAL  Final   Special Requests   Final    BOTTLES DRAWN AEROBIC AND ANAEROBIC Blood Culture adequate volume   Culture NO GROWTH 3 DAYS  Final   Report Status PENDING  Incomplete  Urine culture     Status: Abnormal   Collection Time: 09/13/16  1:30 PM  Result Value Ref Range Status   Specimen Description URINE, CLEAN CATCH  Final   Special Requests NONE  Final   Culture <10,000 COLONIES/mL INSIGNIFICANT GROWTH (A)  Final   Report Status 09/14/2016 FINAL  Final  Culture, expectorated sputum-assessment     Status: None   Collection Time: 09/13/16  4:44 PM  Result Value Ref Range Status   Specimen Description EXPECTORATED SPUTUM  Final   Special Requests NONE  Final   Sputum evaluation THIS SPECIMEN IS ACCEPTABLE FOR SPUTUM CULTURE  Final   Report Status  09/13/2016 FINAL  Final  Culture, respiratory (NON-Expectorated)     Status: None   Collection Time: 09/13/16  4:44 PM  Result Value Ref Range Status   Specimen Description EXPECTORATED SPUTUM  Final   Special Requests NONE Reflexed from [id number]  Final   Gram Stain   Final    ABUNDANT WBC PRESENT, PREDOMINANTLY PMN RARE GRAM POSITIVE COCCI IN PAIRS IN CLUSTERS    Culture Consistent with normal respiratory flora.  Final   Report Status 09/16/2016 FINAL  Final  Respiratory Panel by PCR     Status: None   Collection Time: 09/14/16  4:45 PM  Result Value Ref Range Status   Adenovirus NOT DETECTED NOT DETECTED Final   Coronavirus 229E NOT DETECTED NOT DETECTED Final   Coronavirus HKU1 NOT DETECTED NOT DETECTED Final   Coronavirus NL63 NOT DETECTED NOT DETECTED Final   Coronavirus OC43 NOT DETECTED NOT DETECTED Final   Metapneumovirus NOT DETECTED NOT DETECTED Final   Rhinovirus / Enterovirus NOT DETECTED NOT DETECTED Final   Influenza A NOT DETECTED NOT DETECTED Final   Influenza B NOT DETECTED NOT DETECTED Final   Parainfluenza Virus 1 NOT DETECTED NOT DETECTED Final   Parainfluenza Virus 2 NOT DETECTED NOT DETECTED Final   Parainfluenza Virus 3 NOT DETECTED NOT DETECTED Final   Parainfluenza Virus 4 NOT DETECTED NOT DETECTED Final   Respiratory Syncytial Virus NOT DETECTED NOT DETECTED Final   Bordetella pertussis NOT DETECTED NOT DETECTED Final   Chlamydophila pneumoniae NOT DETECTED NOT DETECTED Final   Mycoplasma pneumoniae NOT DETECTED NOT DETECTED Final         Radiology Studies: Ct Abdomen Pelvis Wo Contrast  Result Date: 09/16/2016 CLINICAL DATA:  Abdominal pain with history of Crohn's disease. EXAM: CT ABDOMEN AND PELVIS WITHOUT CONTRAST TECHNIQUE: Multidetector CT imaging of the abdomen and pelvis was performed following the standard protocol without IV contrast. COMPARISON:  CT abdomen pelvis 09/13/2016 FINDINGS: Lower chest: Bibasilar dependent atelectasis. There are  coronary artery calcifications. Hepatobiliary: There is hepatomegaly without focal lesion. No ascites. No biliary dilatation. Gallbladder surgically absent. Pancreas: Normal pancreatic contours. No peripancreatic fluid collection or pancreatic ductal dilatation. Spleen: Normal. Adrenals/Urinary Tract: Normal adrenal glands. No hydronephrosis or nephrolithiasis. No abnormal perinephric stranding. No ureteral obstruction. There are multiple mixed density exophytic renal lesions bilaterally. Many of these are simple cysts, measuring up to 4.2 cm. Others are probably hemorrhagic cysts. Stomach/Bowel: There is no hiatal hernia. The stomach and duodenum are normal. There is no dilated small bowel or enteric inflammation. There is wall thickening  of the rectum, but this may be exaggerated by underdistention. Appendix is surgically absent. Vascular/Lymphatic: There is atherosclerotic calcification of the non aneurysmal abdominal aorta. There is an aorto bi-iliac endovascular stent graft. There is a stent within the right internal iliac artery, traversing the site of an aneurysm measuring 2.4 cm. 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 No abdominal or pelvic adenopathy. Reproductive: Prostate is enlarged. Musculoskeletal: Left total hip arthroplasty. Screws traverse the right femoral neck. Postsurgical findings at the L5 level. Normal visualized extrathoracic and extraperitoneal soft tissues. Other: No contributory non-categorized findings. IMPRESSION: 1. No acute abnormality of the abdomen or pelvis. No small bowel obstruction. 2. Mild wall thickening of the rectum without inflammatory stranding. The wall thickening might be exaggerated by underdistention. Correlate for symptoms of colitis. 3. Coronary artery and aortic atherosclerosis. 4. Hepatomegaly. 5. Multiple exophytic renal lesions, incompletely characterized without IV contrast. Electronically Signed    By: Ulyses Jarred M.D.   On: 09/16/2016 02:49   Dg Abd Portable 1v  Result Date: 09/15/2016 CLINICAL DATA:  Onset of abdominal pain and distension this morning. EXAM: PORTABLE ABDOMEN - 1 VIEW COMPARISON:  Abdominal CT scan of Sep 13, 2016 FINDINGS: There are small amounts of gas within minimally distended small bowel loops. There is gas in the stomach as well as in the right colon. No free extraluminal gas is observed. Numerous urgent surgical clips and wire sutures are noted in the right aspect of the abdomen. Metallic coils are present in the region of the internal iliac vessel on the left. There is an aorto bi-iliac stent graft in place as well as an additional right-sided iliac branch graft. The bony structures exhibit no acute abnormality. IMPRESSION: Gas within minimally distended small bowel loops may reflect ileus or early partial obstruction. No evidence of perforation. Electronically Signed   By: David  Martinique M.D.   On: 09/15/2016 16:08        Scheduled Meds: . atorvastatin  80 mg Oral QHS  . benzonatate  200 mg Oral TID  . colestipol  1 g Oral BID  . enoxaparin (LOVENOX) injection  55 mg Subcutaneous Q24H  . guaiFENesin  600 mg Oral BID  . ipratropium-albuterol  3 mL Nebulization BID  . traZODone  50 mg Oral QHS  . vancomycin  125 mg Oral Q6H   Continuous Infusions: . metronidazole 500 mg (09/16/16 1613)     LOS: 3 days     Dhillon Comunale, MD, FACP, FHM. [facility] Pager [phone]  If 7PM-7AM, please contact night-coverage [email or web address] Password [facility] 09/16/2016, 4:45 PM

## 2016-09-16 NOTE — Evaluation (Signed)
Physical Therapy Evaluation Patient Details Name: Bill Dunn MRN: [id number] DOB: [birth year] Today's Date: 09/16/2016   History of Present Illness   Bill Dunn is a 81 y.o. male  with past mental history significant for bladder cancer, chronic bronchitis, CK D, Crohn's disease, hypertension, pericarditis, super ventricular tachycardia, TIA. spinal fusion with complications and recent discharge from the hospital with diagnoses of small bowel obstruction and Crohn's disease presents to the hospital with chief complaint of acute encephalopathy.  Clinical Impression  Pt admitted with/for complications above.  Pt needing min assist for basic mobility/gait at this time.  Encephalopathy appears to be resolved..  Pt currently limited functionally due to the problems listed below.  (see problems list.)  Pt will benefit from PT to maximize function and safety to be able to get home safely with available assist.     Follow Up Recommendations Outpatient PT    Equipment Recommendations  None recommended by PT    Recommendations for Other Services       Precautions / Restrictions Precautions Precautions: Fall Precaution Comments: pt reports his legs give away without warning.  Restrictions Weight Bearing Restrictions: No      Mobility  Bed Mobility Overal bed mobility: Needs Assistance Bed Mobility: Sit to Supine     Supine to sit: Min assist     General bed mobility comments: truncal assist to start sit up.  Transfers Overall transfer level: Needs assistance Equipment used: Rolling walker (2 wheeled) Transfers: Sit to/from [facility] Sit to Stand: Min guard Stand pivot transfers: Min guard          Ambulation/Gait Ambulation/Gait assistance: Min assist Ambulation Distance (Feet): 25 Feet (in the room with the RW) Assistive device: Rolling walker (2 wheeled) Gait Pattern/deviations: Step-through pattern Gait velocity: decreased Gait velocity  interpretation: Below normal speed for age/gender General Gait Details: slow and tentative. uncoordinated advancement of R LE,  Stairs            Wheelchair Mobility    Modified Rankin (Stroke Patients Only)       Balance Overall balance assessment: Needs assistance   Sitting balance-Leahy Scale: Good       Standing balance-Leahy Scale: Fair Standing balance comment: can stand unsupported, but should use UE's on RW for safety                             Pertinent Vitals/Pain Pain Assessment: No/denies pain    Home Living Family/patient expects to be discharged to:: Private residence Living Arrangements: Spouse/significant other;Children;Other relatives Available Help at Discharge: Family;Available 24 hours/day Type of Home: House Home Access: Other (comment) (hoping to get a ramped entrance)     Home Layout: Able to live on main level with bedroom/bathroom Home Equipment: Wheelchair - power;Walker - 2 wheels;Shower seat;Cane - single point;Wheelchair - manual;Grab bars - toilet;Grab bars - tub/shower;Hand held shower head;Adaptive equipment      Prior Function Level of Independence: Needs assistance   Gait / Transfers Assistance Needed: Pt uses power w/c, ambulates in the bathroom to the toilet.  ADL's / Homemaking Assistance Needed: Pt requires A from wife to perform basic ADLs and funcitonal transfers        Hand Dominance        Extremity/Trunk Assessment   Upper Extremity Assessment Upper Extremity Assessment: Overall WFL for tasks assessed    Lower Extremity Assessment Lower Extremity Assessment: RLE deficits/detail;LLE deficits/detail RLE Deficits / Details: Decreased strength, grossly 2/5  overall.  Patient reports numbness from groin to foot., but pt does manage to control knee well enough to ambulate with min guard./min assist RLE Sensation: decreased light touch RLE Coordination: decreased gross motor LLE Deficits / Details:  Decreased strength, grossly 3-/5.  Reports numbness from knee to foot. LLE Sensation: decreased light touch LLE Coordination: decreased gross motor       Communication   Communication: HOH  Cognition Arousal/Alertness: Awake/alert Behavior During Therapy: WFL for tasks assessed/performed Overall Cognitive Status: Within Functional Limits for tasks assessed (encephalopathy seems to have resolved)                                        General Comments General comments (skin integrity, edema, etc.): upon sitting, pt became nauseated and then started shaking (as if BP low).  By the time dynamap reading received  BP in sitting 150/7)    Exercises     Assessment/Plan    PT Assessment Patient needs continued PT services  PT Problem List Decreased strength;Decreased activity tolerance;Decreased mobility;Decreased balance;Decreased coordination;Decreased knowledge of use of DME       PT Treatment Interventions DME instruction;Gait training;Functional mobility training;Therapeutic activities;Balance training;Patient/family education    PT Goals (Current goals can be found in the Care Plan section)  Acute Rehab PT Goals Patient Stated Goal: Return home with family PT Goal Formulation: With patient Time For Goal Achievement: 09/30/16 Potential to Achieve Goals: Fair    Frequency Min 3X/week   Barriers to discharge        Co-evaluation               AM-PAC PT "6 Clicks" Daily Activity  Outcome Measure Difficulty turning over in bed (including adjusting bedclothes, sheets and blankets)?: Total Difficulty moving from lying on back to sitting on the side of the bed? : Total Difficulty sitting down on and standing up from a chair with arms (e.g., wheelchair, bedside commode, etc,.)?: A Little Help needed moving to and from a bed to chair (including a wheelchair)?: A Little Help needed walking in hospital room?: A Little Help needed climbing 3-5 steps with a  railing? : A Lot 6 Click Score: 13    End of Session   Activity Tolerance: Patient tolerated treatment well Patient left: in chair;with call bell/phone within reach;with chair alarm set Nurse Communication: Other (comment) PT Visit Diagnosis: Difficulty in walking, not elsewhere classified (R26.2);Muscle weakness (generalized) (M62.81)    Time: 2481-8590 PT Time Calculation (min) (ACUTE ONLY): 36 min   Charges:   PT Evaluation $PT Eval Moderate Complexity: 1 Procedure PT Treatments $Gait Training: 8-22 mins   PT G Codes:        October 16, 2016  Donnella Sham, PT [phone]  (pager)  Tessie Fass Batul Diego October 16, 2016, 12:11 PM

## 2016-09-17 LAB — BASIC METABOLIC PANEL
Anion gap: 9 (ref 5–15)
CO2: 24 mmol/L (ref 22–32)
CREATININE: 1.42 mg/dL — AB (ref 0.61–1.24)
Calcium: 7.9 mg/dL — ABNORMAL LOW (ref 8.9–10.3)
Chloride: 101 mmol/L (ref 101–111)
GFR calc Af Amer: 53 mL/min — ABNORMAL LOW (ref >60)
GFR, EST NON AFRICAN AMERICAN: 46 mL/min — AB (ref >60)
GLUCOSE: 107 mg/dL — AB (ref 65–99)
Potassium: 4 mmol/L (ref 3.5–5.1)
SODIUM: 134 mmol/L — AB (ref 135–145)

## 2016-09-17 LAB — CBC
HEMATOCRIT: 36.7 % — AB (ref 39.0–52.0)
Hemoglobin: 12.3 g/dL — ABNORMAL LOW (ref 13.0–17.0)
MCH: 28.3 pg (ref 26.0–34.0)
MCHC: 33.5 g/dL (ref 30.0–36.0)
MCV: 84.6 fL (ref 78.0–100.0)
PLATELETS: 166 10*3/uL (ref 150–400)
RBC: 4.34 MIL/uL (ref 4.22–5.81)
RDW: 14.5 % (ref 11.5–15.5)
WBC: 5.8 10*3/uL (ref 4.0–10.5)

## 2016-09-17 LAB — MAGNESIUM: Magnesium: 1.8 mg/dL (ref 1.7–2.4)

## 2016-09-17 MED ORDER — METOPROLOL SUCCINATE ER 100 MG PO TB24
200.0000 mg | ORAL_TABLET | Freq: Every day | ORAL | Status: DC
  Administered 2016-09-17: 200 mg via ORAL
  Filled 2016-09-17: qty 2

## 2016-09-17 NOTE — Progress Notes (Signed)
Physical Therapy Treatment Patient Details Name: Bill Dunn MRN: [id number] DOB: 01/05/1936 Today's Date: 09/17/2016    History of Present Illness  Bill Dunn is a 81 y.o. male  with past mental history significant for bladder cancer, chronic bronchitis, CK D, Crohn's disease, hypertension, pericarditis, super ventricular tachycardia, TIA. spinal fusion with complications and recent discharge from the hospital with diagnoses of small bowel obstruction and Crohn's disease presents to the hospital with chief complaint of acute encephalopathy.    PT Comments    Pt performed increased gait.  No signs of cognitive impairment.  Heavy reliance on UEs with use of RW.  Pt remains slow and guarded secondary to pain.  Plan to return home remains appropriate at this time.    Follow Up Recommendations  Outpatient PT     Equipment Recommendations  None recommended by PT    Recommendations for Other Services OT consult     Precautions / Restrictions Precautions Precautions: Fall Precaution Comments: pt reports his legs give away without warning.  Restrictions Weight Bearing Restrictions: No    Mobility  Bed Mobility Overal bed mobility: Needs Assistance Bed Mobility: Supine to Sit Rolling: Supervision         General bed mobility comments: Pt able to perform without assistance.  HOB slightly elevated and + use of rail.    Transfers Overall transfer level: Needs assistance Equipment used: Rolling walker (2 wheeled) Transfers: Sit to/from Stand Sit to Stand: Min guard         General transfer comment: Cues for hand placement to and from seated surface.  pt remains slow and guarded.    Ambulation/Gait Ambulation/Gait assistance: Min guard Ambulation Distance (Feet): 80 Feet Assistive device: Rolling walker (2 wheeled) Gait Pattern/deviations: Step-through pattern;Trunk flexed;Antalgic;Decreased stride length Gait velocity: decreased Gait velocity interpretation: Below  normal speed for age/gender General Gait Details: Pt remains slow and tentative.  Pt with standing rest break x2 with cues for upper trunk control.  Pt with decreased stride.  c/o increased pain in R knee.     Stairs            Wheelchair Mobility    Modified Rankin (Stroke Patients Only)       Balance     Sitting balance-Leahy Scale: Good       Standing balance-Leahy Scale: Fair                              Cognition Arousal/Alertness: Awake/alert Behavior During Therapy: WFL for tasks assessed/performed Overall Cognitive Status: Within Functional Limits for tasks assessed                                        Exercises      General Comments        Pertinent Vitals/Pain Pain Assessment: 0-10 Pain Score: 6  Pain Location: B knees and his stomach Pain Descriptors / Indicators: Aching Pain Intervention(s): Limited activity within patient's tolerance;Repositioned    Home Living                      Prior Function            PT Goals (current goals can now be found in the care plan section) Acute Rehab PT Goals Patient Stated Goal: Return home with family Potential to Achieve Goals: Fair Progress towards PT  goals: Progressing toward goals    Frequency    Min 3X/week      PT Plan Current plan remains appropriate    Co-evaluation              AM-PAC PT "6 Clicks" Daily Activity  Outcome Measure  Difficulty turning over in bed (including adjusting bedclothes, sheets and blankets)?: A Little Difficulty moving from lying on back to sitting on the side of the bed? : A Little Difficulty sitting down on and standing up from a chair with arms (e.g., wheelchair, bedside commode, etc,.)?: A Little Help needed moving to and from a bed to chair (including a wheelchair)?: A Little Help needed walking in hospital room?: A Little Help needed climbing 3-5 steps with a railing? : A Lot 6 Click Score: 17    End  of Session Equipment Utilized During Treatment: Gait belt Activity Tolerance: Patient tolerated treatment well Patient left: with call bell/phone within reach;in bed;with bed alarm set Nurse Communication: Other (comment) PT Visit Diagnosis: Difficulty in walking, not elsewhere classified (R26.2);Muscle weakness (generalized) (M62.81)     Time: 9021-1155 PT Time Calculation (min) (ACUTE ONLY): 20 min  Charges:  $Gait Training: 8-22 mins                    G Codes:       Governor Rooks, PTA pager [place] 09/17/2016, 3:00 PM

## 2016-09-17 NOTE — Progress Notes (Signed)
PROGRESS NOTE   Bill Dunn  NID:[id number]    DOB: [birth year]    DOA: 09/13/2016  PCP: [name], MD   I have briefly reviewed patients previous medical records in [facility].  Brief Narrative:  81 year old male with PMH of Crohn's disease status post multiple bowel resections and Port-A-Cath ([facility]), stage III chronic kidney disease, AAA status post stent grafting 2012, bladder cancer, HTN, hypertriglyceridemia, morbid obesity, SVT, TIA, recent hospitalization 5/4-5/10 for SBO that resolved with conservative treatment and C. difficile diarrhea-discharged on oral vancomycin, presented with confusion and sepsis-like picture.Sepsis workup negative. Slowly improving.   Assessment & Plan:   Principal Problem:   Sepsis with multi-organ dysfunction (HCC) Active Problems:   HTN (hypertension)   Gastroesophageal reflux disease without esophagitis   CKD (chronic kidney disease), stage III   C. difficile diarrhea   Sepsis (HCC)   1. C. difficile colitis: Continue oral vancomycin. Discontinue empirically started IV Flagyl. Added probiotics and encouraged probiotic yogurts. Abdominal pain and diarrhea improving. 2. Sepsis: WBC 16.3 on admission. Lactate normal. RSV panel negative. Sputum culture: Normal OP flora. Urine culture showed insignificant growth.blood cultures 2: Negative to date. CT of abdomen and pelvis 5/13: No evidence of acute abnormalities. Repeat CT abdomen 5/16: No acute abnormalities or small bowel obstruction. Mild wall thickening of the rectum without inflammatory stranding.? All related to C. difficile colitis. Empirically started broad-spectrum IV antibiotics were discontinued especially due to concern for worsening C. difficile. Sepsis physiology resolved. 3. Acute encephalopathy: Presented with confusion. Home psychotropic medications were resumed at lower dose. Judicious use of opioids. Resolved. 4. Acute on stage III chronic kidney disease: Creatinine  has improved and plateaued in the 1.4 range. Follow BMP as outpatient. 5. Crohn's disease: Gives history of chronic intermittent loose stools. Discussion as above, if does not improve then GI consultation. 6. Abdominal pain: Likely related to problem #1. CT abdomen 5/15 without acute findings. Improved. Advance to soft diet. Monitor 7. Essential hypertension: Controlled. Holding home antihypertensives, may resume at discharge. When necessary hydralazine. 8. Dyslipidemia: Continue statins. 9. Multiple exophytic renal lesions, seen on CT abdomen: Outpatient follow-up as deemed necessary. 10. Anemia and thrombocytopenia: Anemia stable. Thrombocytopenia resolved. 11. Hypokalemia: Replaced 12. Hypomagnesemia: Replaced 13. NSSVT: Replaced potassium and magnesium. Having occasional episodes of SVT. Increase Toprol-XL to home dose.   DVT prophylaxis: Subcutaneous heparin Code Status: DO NOT RESUSCITATE Family Communication: Discussed in detail with patient's daughter at bedside. Disposition: DC home when medically improved and stable, possibly 5/18   Consultants:  None   Procedures:  None  Antimicrobials:  Oral vancomycin IV Flagyl -discontinue   Subjective: Finally starting to feel better. Abdominal distention and pain significantly improved. Tolerated clear liquids and wants to skip full liquids and go to soft diet. Frequency and consistency of diarrhea improved.  ROS: No confusion, chest pain or dyspnea.  Objective:  Vitals:   09/16/16 2133 09/17/16 0220 09/17/16 0437 09/17/16 1431  BP: 134/68  (!) 149/79 128/62  Pulse: 82  67 71  Resp: 18  18 18   Temp: 98.4 F (36.9 C)  98.4 F (36.9 C) 98.7 F (37.1 C)  TempSrc: Oral  Oral Oral  SpO2: 97%  96% 94%  Weight:  111.7 kg (246 lb 4.8 oz)    Height:        Examination:  General exam: Pleasant elderly male, morbidly obese, lying comfortably propped up in bed.Looks better than he did yesterday. Respiratory system: Clear to  auscultation without wheezing, rhonchi or crackles.  Respiratory effort normal. Port-A-Cath +. Cardiovascular system: S1 & S2 heard, RRR. No JVD, murmurs, rubs, gallops or clicks. No pedal edema. Telemetry: Sinus rhythm. An episode of nonsustained SVT in the 160s at 6:28 PM last night. Gastrointestinal system: Abdomen is nondistended/obese, soft and nontender. No organomegaly or masses felt. Normal bowel sounds heard. Central nervous system: Alert and oriented. No focal neurological deficits. Extremities: Symmetric 5 x 5 power. Skin: No rashes, lesions or ulcers Psychiatry: Judgement and insight appear normal. Mood & affect appropriate.     Data Reviewed: I have personally reviewed following labs and imaging studies  CBC:  Recent Labs Lab 09/13/16 0930 09/14/16 1211 09/15/16 0830 09/16/16 0432 09/17/16 0456  WBC 16.3* 10.6* 6.8 5.7 5.8  NEUTROABS 12.8*  --   --  3.5  --   HGB 13.8 11.9* 11.4* 11.7* 12.3*  HCT 40.7 36.8* 35.9* 35.0* 36.7*  MCV 85.0 86.2 85.3 84.7 84.6  PLT 174 137* 134* 143* 591   Basic Metabolic Panel:  Recent Labs Lab 09/13/16 0930 09/13/16 1751 09/14/16 1211 09/15/16 0830 09/16/16 0432 09/17/16 0456  NA 136  --  137 138 138 134*  K 3.2*  --  3.2* 2.9* 3.2* 4.0  CL 103  --  105 106 107 101  CO2 21*  --  23 22 23 24   GLUCOSE 135*  --  152* 126* 116* 107*  BUN 14  --  14 11 8  <5*  CREATININE 1.75*  --  1.66* 1.69* 1.47* 1.42*  CALCIUM 8.2*  --  7.4* 7.5* 7.7* 7.9*  MG  --  1.2*  --   --  1.1* 1.8  PHOS  --  3.6  --   --   --   --    Liver Function Tests:  Recent Labs Lab 09/13/16 0930 09/14/16 1211 09/16/16 0432  AST 22 18 32  ALT 23 18 27   ALKPHOS 64 50 60  BILITOT 1.2 1.0 1.1  PROT 6.8 5.7* 5.5*  ALBUMIN 3.3* 2.7* 2.6*   Cardiac Enzymes:  Recent Labs Lab 09/13/16 1751  TROPONINI <0.03   CBG: No results for input(s): GLUCAP in the last 168 hours.  Recent Results (from the past 240 hour(s))  C difficile quick scan w PCR reflex      Status: Abnormal   Collection Time: 09/08/16 10:54 PM  Result Value Ref Range Status   C Diff antigen POSITIVE (A) NEGATIVE Final   C Diff toxin NEGATIVE NEGATIVE Final   C Diff interpretation Results are indeterminate. See PCR results.  Final  Clostridium Difficile by PCR     Status: Abnormal   Collection Time: 09/08/16 10:54 PM  Result Value Ref Range Status   Toxigenic C Difficile by pcr POSITIVE (A) NEGATIVE Final    Comment: Positive for toxigenic C. difficile with little to no toxin production. Only treat if clinical presentation suggests symptomatic illness. RESULT CALLED TO, READ BACK BY AND VERIFIED WITH: Karin Golden RN, AT [phone] 09/09/16 BY D.VANHOOK   Blood culture (routine x 2)     Status: None (Preliminary result)   Collection Time: 09/13/16  9:28 AM  Result Value Ref Range Status   Specimen Description BLOOD PORTA CATH  Final   Special Requests   Final    BOTTLES DRAWN AEROBIC AND ANAEROBIC Blood Culture adequate volume   Culture NO GROWTH 4 DAYS  Final   Report Status PENDING  Incomplete  Blood culture (routine x 2)     Status: None (Preliminary result)  Collection Time: 09/13/16 10:00 AM  Result Value Ref Range Status   Specimen Description BLOOD RIGHT ANTECUBITAL  Final   Special Requests   Final    BOTTLES DRAWN AEROBIC AND ANAEROBIC Blood Culture adequate volume   Culture NO GROWTH 4 DAYS  Final   Report Status PENDING  Incomplete  Urine culture     Status: Abnormal   Collection Time: 09/13/16  1:30 PM  Result Value Ref Range Status   Specimen Description URINE, CLEAN CATCH  Final   Special Requests NONE  Final   Culture <10,000 COLONIES/mL INSIGNIFICANT GROWTH (A)  Final   Report Status 09/14/2016 FINAL  Final  Culture, expectorated sputum-assessment     Status: None   Collection Time: 09/13/16  4:44 PM  Result Value Ref Range Status   Specimen Description EXPECTORATED SPUTUM  Final   Special Requests NONE  Final   Sputum evaluation THIS SPECIMEN IS  ACCEPTABLE FOR SPUTUM CULTURE  Final   Report Status 09/13/2016 FINAL  Final  Culture, respiratory (NON-Expectorated)     Status: None   Collection Time: 09/13/16  4:44 PM  Result Value Ref Range Status   Specimen Description EXPECTORATED SPUTUM  Final   Special Requests NONE Reflexed from [id number]  Final   Gram Stain   Final    ABUNDANT WBC PRESENT, PREDOMINANTLY PMN RARE GRAM POSITIVE COCCI IN PAIRS IN CLUSTERS    Culture Consistent with normal respiratory flora.  Final   Report Status 09/16/2016 FINAL  Final  Respiratory Panel by PCR     Status: None   Collection Time: 09/14/16  4:45 PM  Result Value Ref Range Status   Adenovirus NOT DETECTED NOT DETECTED Final   Coronavirus 229E NOT DETECTED NOT DETECTED Final   Coronavirus HKU1 NOT DETECTED NOT DETECTED Final   Coronavirus NL63 NOT DETECTED NOT DETECTED Final   Coronavirus OC43 NOT DETECTED NOT DETECTED Final   Metapneumovirus NOT DETECTED NOT DETECTED Final   Rhinovirus / Enterovirus NOT DETECTED NOT DETECTED Final   Influenza A NOT DETECTED NOT DETECTED Final   Influenza B NOT DETECTED NOT DETECTED Final   Parainfluenza Virus 1 NOT DETECTED NOT DETECTED Final   Parainfluenza Virus 2 NOT DETECTED NOT DETECTED Final   Parainfluenza Virus 3 NOT DETECTED NOT DETECTED Final   Parainfluenza Virus 4 NOT DETECTED NOT DETECTED Final   Respiratory Syncytial Virus NOT DETECTED NOT DETECTED Final   Bordetella pertussis NOT DETECTED NOT DETECTED Final   Chlamydophila pneumoniae NOT DETECTED NOT DETECTED Final   Mycoplasma pneumoniae NOT DETECTED NOT DETECTED Final         Radiology Studies: Ct Abdomen Pelvis Wo Contrast  Result Date: 09/16/2016 CLINICAL DATA:  Abdominal pain with history of Crohn's disease. EXAM: CT ABDOMEN AND PELVIS WITHOUT CONTRAST TECHNIQUE: Multidetector CT imaging of the abdomen and pelvis was performed following the standard protocol without IV contrast. COMPARISON:  CT abdomen pelvis 09/13/2016 FINDINGS:  Lower chest: Bibasilar dependent atelectasis. There are coronary artery calcifications. Hepatobiliary: There is hepatomegaly without focal lesion. No ascites. No biliary dilatation. Gallbladder surgically absent. Pancreas: Normal pancreatic contours. No peripancreatic fluid collection or pancreatic ductal dilatation. Spleen: Normal. Adrenals/Urinary Tract: Normal adrenal glands. No hydronephrosis or nephrolithiasis. No abnormal perinephric stranding. No ureteral obstruction. There are multiple mixed density exophytic renal lesions bilaterally. Many of these are simple cysts, measuring up to 4.2 cm. Others are probably hemorrhagic cysts. Stomach/Bowel: There is no hiatal hernia. The stomach and duodenum are normal. There is no dilated small bowel or  enteric inflammation. There is wall thickening of the rectum, but this may be exaggerated by underdistention. Appendix is surgically absent. Vascular/Lymphatic: There is atherosclerotic calcification of the non aneurysmal abdominal aorta. There is an aorto bi-iliac endovascular stent graft. There is a stent within the right internal iliac artery, traversing the site of an aneurysm measuring 2.4 cm. 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 No abdominal or pelvic adenopathy. Reproductive: Prostate is enlarged. Musculoskeletal: Left total hip arthroplasty. Screws traverse the right femoral neck. Postsurgical findings at the L5 level. Normal visualized extrathoracic and extraperitoneal soft tissues. Other: No contributory non-categorized findings. IMPRESSION: 1. No acute abnormality of the abdomen or pelvis. No small bowel obstruction. 2. Mild wall thickening of the rectum without inflammatory stranding. The wall thickening might be exaggerated by underdistention. Correlate for symptoms of colitis. 3. Coronary artery and aortic atherosclerosis. 4. Hepatomegaly. 5. Multiple exophytic renal lesions, incompletely  characterized without IV contrast. Electronically Signed   By: Ulyses Jarred M.D.   On: 09/16/2016 02:49        Scheduled Meds: . atorvastatin  80 mg Oral QHS  . benzonatate  200 mg Oral TID  . colestipol  1 g Oral BID  . enoxaparin (LOVENOX) injection  55 mg Subcutaneous Q24H  . guaiFENesin  600 mg Oral BID  . ipratropium-albuterol  3 mL Nebulization BID  . metoprolol  50 mg Oral QHS  . montelukast  10 mg Oral QHS  . saccharomyces boulardii  250 mg Oral BID  . traZODone  50 mg Oral QHS  . vancomycin  125 mg Oral Q6H   Continuous Infusions: . metronidazole Stopped (09/17/16 0940)     LOS: 4 days     Candid Bovey, MD, FACP, FHM. [facility] Pager [phone]  If 7PM-7AM, please contact night-coverage [email or web address] Password [facility] 09/17/2016, 4:34 PM

## 2016-09-18 DIAGNOSIS — I10 Essential (primary) hypertension: Secondary | ICD-10-CM

## 2016-09-18 LAB — CULTURE, BLOOD (ROUTINE X 2)
CULTURE: NO GROWTH
CULTURE: NO GROWTH
Special Requests: ADEQUATE
Special Requests: ADEQUATE

## 2016-09-18 MED ORDER — HEPARIN SOD (PORK) LOCK FLUSH 100 UNIT/ML IV SOLN
500.0000 [IU] | INTRAVENOUS | Status: DC | PRN
  Administered 2016-09-18: 500 [IU]
  Filled 2016-09-18 (×2): qty 5

## 2016-09-18 MED ORDER — HEPARIN SOD (PORK) LOCK FLUSH 100 UNIT/ML IV SOLN
500.0000 [IU] | INTRAVENOUS | Status: DC
  Filled 2016-09-18: qty 5

## 2016-09-18 MED ORDER — TRAZODONE HCL 100 MG PO TABS: 100.0000 mg | ORAL_TABLET | Freq: Every day | ORAL | Status: AC

## 2016-09-18 MED ORDER — HYDROCODONE-ACETAMINOPHEN 5-325 MG PO TABS: 1.0000 | ORAL_TABLET | Freq: Three times a day (TID) | ORAL | Status: AC | PRN

## 2016-09-18 MED ORDER — VANCOMYCIN HCL 125 MG PO CAPS: 125.0000 mg | ORAL_CAPSULE | Freq: Four times a day (QID) | ORAL | Status: AC

## 2016-09-18 MED ORDER — SACCHAROMYCES BOULARDII 250 MG PO CAPS: 250.0000 mg | ORAL_CAPSULE | Freq: Two times a day (BID) | ORAL | 0 refills | Status: AC

## 2016-09-18 NOTE — Discharge Instructions (Signed)

## 2016-09-18 NOTE — Discharge Summary (Signed)
Physician Discharge Summary  Bill Dunn TML:[id number] DOB: [birth year]  PCP: [name], MD  Admit date: 09/13/2016 Discharge date: 09/18/2016  Recommendations for Outpatient Follow-up:  1. Dr. [name], PCP In 5 days with repeat labs (CBC & BMP).  Home Health: Outpatient PT Equipment/Devices: None    Discharge Condition: Improved and stable  CODE STATUS: DO NOT RESUSCITATE  Diet recommendation: Heart healthy diet.  Discharge Diagnoses:  Principal Problem:   Sepsis with multi-organ dysfunction ([place]) Active Problems:   HTN (hypertension)   Gastroesophageal reflux disease without esophagitis   CKD (chronic kidney disease), stage III   C. difficile diarrhea   Sepsis [facility])   Brief Summary: 81 year old male with PMH of Crohn's disease status post multiple bowel resections and Port-A-Cath, stage III chronic kidney disease, AAA status post stent grafting 2012, bladder cancer, HTN, hypertriglyceridemia, morbid obesity, SVT, TIA, recent hospitalization 5/4-5/10 for SBO that resolved with conservative treatment and C. difficile diarrhea-discharged on oral vancomycin, presented with confusion and sepsis-like picture.   Assessment & Plan:   1. C. difficile colitis: Treated with oral vancomycin 125 MG 4 times daily. Discontinued empirically started IV Flagyl. Added probiotics and encouraged probiotic yogurts. Significantly improved without any further abdominal pain. Tolerating regular diet. Diarrhea has improved. Patient had only taken 2-3 days of the previous prescription of oral vancomycin and he was advised to complete the course which would total >2 weeks. 2. Sepsis: WBC 16.3 on admission. Lactate normal. RSV panel negative. Sputum culture: Normal OP flora. Urine culture showed insignificant growth. Blood cultures 2: Negative. CT of abdomen and pelvis 5/13: No evidence of acute abnormalities. Repeat CT abdomen 5/16: No acute abnormalities or small bowel  obstruction. Mild wall thickening of the rectum without inflammatory stranding.? All related to C. difficile colitis. Empirically started broad-spectrum IV antibiotics were discontinued especially due to concern for worsening C. difficile. Sepsis physiology resolved. 3. Acute encephalopathy: Presented with confusion. Home psychotropic medications were resumed at lower dose. Opioids reduced at discharge. Resolved. 4. Acute on stage III chronic kidney disease: Creatinine has improved and plateaued in the 1.4 range. Follow BMP as outpatient. 5. Crohn's disease: Gives history of chronic intermittent loose stools. Today patient stated that he follows with GI in [place] 6. Abdominal pain: Likely related to problem #1. CT abdomen 5/15 without acute findings. Much improved. 7. Essential hypertension: Mildly uncontrolled fluctuations at times. Continue amlodipine and Toprol-XL. HCTZ discontinued. 8. Dyslipidemia: Continue statins. 9. Multiple exophytic renal lesions, seen on CT abdomen: Outpatient follow-up as deemed necessary. 10. Anemia and thrombocytopenia: Anemia stable. Thrombocytopenia resolved. 11. Hypokalemia: Replaced 12. Hypomagnesemia: Replaced 13. NSSVT: Replaced potassium and magnesium. Having occasional episodes of SVT. Continue home dose of Toprol-XL 200 MG at bedtime. Reports occasional/intermittent palpitations even at home. Outpatient follow-up with PCP.   Consultants:  None   Procedures:  None   Discharge Instructions  Discharge Instructions    Ambulatory referral to Physical Therapy    Complete by:  As directed    Call MD for:    Complete by:  As directed    Confusion.   Call MD for:  difficulty breathing, headache or visual disturbances    Complete by:  As directed    Call MD for:  extreme fatigue    Complete by:  As directed    Call MD for:  persistant dizziness or light-headedness    Complete by:  As directed    Call MD for:  persistant nausea and vomiting     Complete by:  As  directed    Call MD for:  severe uncontrolled pain    Complete by:  As directed    Call MD for:  temperature >100.4    Complete by:  As directed    Diet - low sodium heart healthy    Complete by:  As directed    Increase activity slowly    Complete by:  As directed        Medication List    STOP taking these medications   aspirin 81 MG EC tablet   hydrochlorothiazide 25 MG tablet Commonly known as:  HYDRODIURIL     TAKE these medications   albuterol 108 (90 Base) MCG/ACT inhaler Commonly known as:  PROVENTIL HFA;VENTOLIN HFA Inhale 2 puffs into the lungs every 6 (six) hours as needed for wheezing or shortness of breath.   amLODipine 10 MG tablet Commonly known as:  NORVASC Take 1 tablet (10 mg total) by mouth daily.   atorvastatin 80 MG tablet Commonly known as:  LIPITOR Take 80 mg by mouth at bedtime.   COLESTID 1 g tablet Generic drug:  colestipol Take 1 g by mouth 2 (two) times daily.   cyclobenzaprine 5 MG tablet Commonly known as:  FLEXERIL Take 5 mg by mouth 3 (three) times daily as needed for muscle spasms.   HYDROcodone-acetaminophen 5-325 MG tablet Commonly known as:  NORCO/VICODIN Take 1 tablet by mouth every 8 (eight) hours as needed for moderate pain or severe pain. What changed:  when to take this  reasons to take this   metoprolol 200 MG 24 hr tablet Commonly known as:  TOPROL-XL Take 200 mg by mouth at bedtime.   montelukast 10 MG tablet Commonly known as:  SINGULAIR Take 1 tablet (10 mg total) by mouth at bedtime.   nitroGLYCERIN 0.4 MG SL tablet Commonly known as:  NITROSTAT Place 1 tablet (0.4 mg total) under the tongue every 5 (five) minutes x 3 doses as needed for chest pain.   saccharomyces boulardii 250 MG capsule Commonly known as:  FLORASTOR Take 1 capsule (250 mg total) by mouth 2 (two) times daily.   traZODone 100 MG tablet Commonly known as:  DESYREL Take 1 tablet (100 mg total) by mouth at  bedtime. What changed:  how much to take   vancomycin 125 MG capsule Commonly known as:  VANCOCIN Take 1 capsule (125 mg total) by mouth 4 (four) times daily. Complete the course of antibiotics you received during prior hospital admission. What changed:  medication strength  additional instructions      Follow-up Information    [facility] Follow up.   Specialty:  Rehabilitation Why:  Office will call and arrange visit Contact information: [address] [place] [id number] [place]       [name], MD. Schedule an appointment as soon as possible for a visit in 5 day(s).   Specialty:  Family Medicine Why:  to be seen with repeat labs (CBC & BMP). Contact information: [address] [place] [address] [phone]          Allergies  Allergen Reactions  . Amoxil [Amoxicillin] Other (See Comments)    REACTION: C-diff  . Augmentin [Amoxicillin-Pot Clavulanate] Other (See Comments)    REACTION: C-Diff  . Dilaudid [Hydromorphone Hcl] Shortness Of Breath and Other (See Comments)    Stomach pain - reaction to injection of dilaudid and reglan together  . Reglan [Metoclopramide] Shortness Of Breath and Other (See Comments)  Stomach pain - reaction to injection of dilaudid and reglan together  . Ciprofloxacin Diarrhea and Other (See Comments)    Caused C-diff  . Demerol [Meperidine] Nausea And Vomiting  . Infliximab Nausea Only    Reaction to Remicade/ flu like symptoms  . Morphine And Related Nausea And Vomiting  . Sulfamethoxazole-Trimethoprim Other (See Comments)    Caused renal failure in combination with Prednisone  . Vitamin D Analogs Rash  . Levaquin [Levofloxacin] Other (See Comments)    Joint pain  . Ace Inhibitors Cough    Reaction to ramipril      Procedures/Studies: Ct Abdomen Pelvis Wo Contrast  Result Date: 09/16/2016 CLINICAL DATA:  Abdominal  pain with history of Crohn's disease. EXAM: CT ABDOMEN AND PELVIS WITHOUT CONTRAST TECHNIQUE: Multidetector CT imaging of the abdomen and pelvis was performed following the standard protocol without IV contrast. COMPARISON:  CT abdomen pelvis 09/13/2016 FINDINGS: Lower chest: Bibasilar dependent atelectasis. There are coronary artery calcifications. Hepatobiliary: There is hepatomegaly without focal lesion. No ascites. No biliary dilatation. Gallbladder surgically absent. Pancreas: Normal pancreatic contours. No peripancreatic fluid collection or pancreatic ductal dilatation. Spleen: Normal. Adrenals/Urinary Tract: Normal adrenal glands. No hydronephrosis or nephrolithiasis. No abnormal perinephric stranding. No ureteral obstruction. There are multiple mixed density exophytic renal lesions bilaterally. Many of these are simple cysts, measuring up to 4.2 cm. Others are probably hemorrhagic cysts. Stomach/Bowel: There is no hiatal hernia. The stomach and duodenum are normal. There is no dilated small bowel or enteric inflammation. There is wall thickening of the rectum, but this may be exaggerated by underdistention. Appendix is surgically absent. Vascular/Lymphatic: There is atherosclerotic calcification of the non aneurysmal abdominal aorta. There is an aorto bi-iliac endovascular stent graft. There is a stent within the right internal iliac artery, traversing the site of an aneurysm measuring 2.4 cm. 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 No abdominal or pelvic adenopathy. Reproductive: Prostate is enlarged. Musculoskeletal: Left total hip arthroplasty. Screws traverse the right femoral neck. Postsurgical findings at the L5 level. Normal visualized extrathoracic and extraperitoneal soft tissues. Other: No contributory non-categorized findings. IMPRESSION: 1. No acute abnormality of the abdomen or pelvis. No small bowel obstruction. 2. Mild wall  thickening of the rectum without inflammatory stranding. The wall thickening might be exaggerated by underdistention. Correlate for symptoms of colitis. 3. Coronary artery and aortic atherosclerosis. 4. Hepatomegaly. 5. Multiple exophytic renal lesions, incompletely characterized without IV contrast. Electronically Signed   By: Ulyses Jarred M.D.   On: 09/16/2016 02:49   Ct Abdomen Pelvis Wo Contrast  Result Date: 09/13/2016 CLINICAL DATA:  81 year old male with diarrhea and sepsis. History of chronic kidney disease and Crohn's disease. EXAM: CT ABDOMEN AND PELVIS WITHOUT CONTRAST TECHNIQUE: Multidetector CT imaging of the abdomen and pelvis was performed following the standard protocol without IV contrast. COMPARISON:  09/04/2016 CT FINDINGS: Please note that parenchymal abnormalities may be missed without intravenous contrast. Lower chest: Mild bibasilar atelectasis/ scarring again noted. Cardiomegaly and coronary artery disease again identified. Hepatobiliary: The liver is unremarkable. The patient is status post cholecystectomy. Pancreas: Unremarkable Spleen: Unremarkable Adrenals/Urinary Tract: Bilateral renal cortical atrophy identified. Multiple bilateral renal lesions are identified, most representing cysts but some are indeterminate. There is no evidence of hydronephrosis or urinary calculi. The adrenal glands are unremarkable. No definite bladder abnormality noted. Stomach/Bowel: No definite bowel wall thickening identified. There is no evidence of bowel dilatation or pneumoperitoneum. Vascular/Lymphatic: Aorto bi-iliac endograft again identified. No enlarged  lymph nodes noted. Reproductive: Prostate within normal limits. Other: No ascites or focal collection. Musculoskeletal: No acute abnormalities or suspicious focal bony lesions. Left hip arthroplasty and screws within the proximal right femur noted. IMPRESSION: No evidence of acute abnormality. No acute bowel abnormalities identified. Cardiomegaly,  coronary disease and aortic graft. Bilateral renal lesions again identified, most which represent cysts but some indeterminate. Electronically Signed   By: Margarette Canada M.D.   On: 09/13/2016 19:41   Ct Head Wo Contrast  Result Date: 09/13/2016 CLINICAL DATA:  Confusion, disorientation, difficulty walking EXAM: CT HEAD WITHOUT CONTRAST TECHNIQUE: Contiguous axial images were obtained from the base of the skull through the vertex without intravenous contrast. COMPARISON:  CT head 04/13/2013 FINDINGS: Brain: Streak artifact from cochlear implant on the right. Ventricle size normal. Cerebral volume normal for age. Negative for acute infarct, hemorrhage, mass. Benign periventricular calcification on the left is stable from the prior study. Vascular: Negative for hyperdense vessel Skull: No acute skeletal abnormality Sinuses/Orbits: Paranasal sinuses clear. Right mastoidectomy and cochlear implant. Small amount of mucosal edema in the mastoid tip on the right. Other: None IMPRESSION: No acute abnormality. There is artifact related to cochlear implant on the right. Overall unchanged from 2014.44 Electronically Signed   By: Franchot Gallo M.D.   On: 09/13/2016 11:21   Dg Chest Port 1 View  Result Date: 09/14/2016 CLINICAL DATA:  Clostridium difficile diarrhea EXAM: PORTABLE CHEST 1 VIEW COMPARISON:  09/13/2016 FINDINGS: Mild cardiac enlargement without heart failure. Lungs remain clear without infiltrate or effusion. Port-A-Cath tip in the right atrium, unchanged in position. IMPRESSION: No active disease. Electronically Signed   By: Franchot Gallo M.D.   On: 09/14/2016 07:28   Dg Chest Portable 1 View  Result Date: 09/13/2016 CLINICAL DATA:  Fever. EXAM: PORTABLE CHEST 1 VIEW COMPARISON:  Sep 05, 2016 FINDINGS: The left Port-A-Cath terminates in the right atrium, unchanged. No pneumothorax. The cardiomediastinal silhouette is stable. No focal infiltrate. IMPRESSION: No active disease. Electronically Signed   By:  Dorise Bullion III M.D   On: 09/13/2016 09:35   Dg Abd Portable 1v  Result Date: 09/15/2016 CLINICAL DATA:  Onset of abdominal pain and distension this morning. EXAM: PORTABLE ABDOMEN - 1 VIEW COMPARISON:  Abdominal CT scan of Sep 13, 2016 FINDINGS: There are small amounts of gas within minimally distended small bowel loops. There is gas in the stomach as well as in the right colon. No free extraluminal gas is observed. Numerous urgent surgical clips and wire sutures are noted in the right aspect of the abdomen. Metallic coils are present in the region of the internal iliac vessel on the left. There is an aorto bi-iliac stent graft in place as well as an additional right-sided iliac branch graft. The bony structures exhibit no acute abnormality. IMPRESSION: Gas within minimally distended small bowel loops may reflect ileus or early partial obstruction. No evidence of perforation. Electronically Signed   By: David  Martinique M.D.   On: 09/15/2016 16:08      Subjective: Continues to feel much better. Tolerating regular diet. States that his abdominal pain is almost resolved. Diarrhea has improved with reduced frequency and improving consistency. Anxious to go home. Denies any other complaints. Does report occasional/intermittent palpitations at home.  Discharge Exam:  Vitals:   09/17/16 1431 09/17/16 2144 09/18/16 0627 09/18/16 1411  BP: 128/62 (!) 105/55 (!) 171/86 114/65  Pulse: 71 93 72 72  Resp: 18 18 18 18   Temp: 98.7 F (37.1 C) 98.9 F (  37.2 C) 98.4 F (36.9 C) 97.5 F (36.4 C)  TempSrc: Oral     SpO2: 94% 92% 95% 91%  Weight:   109.6 kg (241 lb 10 oz)   Height:        General exam: Pleasant elderly male, morbidly obese, lying comfortably propped up in bed. Respiratory system: Clear to auscultation without wheezing, rhonchi or crackles. Respiratory effort normal. Port-A-Cath +. Cardiovascular system: S1 & S2 heard, RRR. No JVD, murmurs, rubs, gallops or clicks. No pedal edema.  Telemetry: Sinus rhythm. Occasional episode of nonsustained SVT in the 170s at 8:06 PM last night (prior to increasing his Toprol-XL to home dose) Gastrointestinal system: Abdomen is nondistended/obese, soft and nontender. No organomegaly or masses felt. Normal bowel sounds heard. Central nervous system: Alert and oriented. No focal neurological deficits. Extremities: Symmetric 5 x 5 power. Skin: No rashes, lesions or ulcers Psychiatry: Judgement and insight appear normal. Mood & affect appropriate.     The results of significant diagnostics from this hospitalization (including imaging, microbiology, ancillary and laboratory) are listed below for reference.     Microbiology: Recent Results (from the past 240 hour(s))  C difficile quick scan w PCR reflex     Status: Abnormal   Collection Time: 09/08/16 10:54 PM  Result Value Ref Range Status   C Diff antigen POSITIVE (A) NEGATIVE Final   C Diff toxin NEGATIVE NEGATIVE Final   C Diff interpretation Results are indeterminate. See PCR results.  Final  Clostridium Difficile by PCR     Status: Abnormal   Collection Time: 09/08/16 10:54 PM  Result Value Ref Range Status   Toxigenic C Difficile by pcr POSITIVE (A) NEGATIVE Final    Comment: Positive for toxigenic C. difficile with little to no toxin production. Only treat if clinical presentation suggests symptomatic illness. RESULT CALLED TO, READ BACK BY AND VERIFIED WITH: Karin Golden RN, AT [phone] 09/09/16 BY D.VANHOOK   Blood culture (routine x 2)     Status: None   Collection Time: 09/13/16  9:28 AM  Result Value Ref Range Status   Specimen Description BLOOD PORTA CATH  Final   Special Requests   Final    BOTTLES DRAWN AEROBIC AND ANAEROBIC Blood Culture adequate volume   Culture NO GROWTH 5 DAYS  Final   Report Status 09/18/2016 FINAL  Final  Blood culture (routine x 2)     Status: None   Collection Time: 09/13/16 10:00 AM  Result Value Ref Range Status   Specimen Description BLOOD  RIGHT ANTECUBITAL  Final   Special Requests   Final    BOTTLES DRAWN AEROBIC AND ANAEROBIC Blood Culture adequate volume   Culture NO GROWTH 5 DAYS  Final   Report Status 09/18/2016 FINAL  Final  Urine culture     Status: Abnormal   Collection Time: 09/13/16  1:30 PM  Result Value Ref Range Status   Specimen Description URINE, CLEAN CATCH  Final   Special Requests NONE  Final   Culture <10,000 COLONIES/mL INSIGNIFICANT GROWTH (A)  Final   Report Status 09/14/2016 FINAL  Final  Culture, expectorated sputum-assessment     Status: None   Collection Time: 09/13/16  4:44 PM  Result Value Ref Range Status   Specimen Description EXPECTORATED SPUTUM  Final   Special Requests NONE  Final   Sputum evaluation THIS SPECIMEN IS ACCEPTABLE FOR SPUTUM CULTURE  Final   Report Status 09/13/2016 FINAL  Final  Culture, respiratory (NON-Expectorated)     Status: None  Collection Time: 09/13/16  4:44 PM  Result Value Ref Range Status   Specimen Description EXPECTORATED SPUTUM  Final   Special Requests NONE Reflexed from [id number]  Final   Gram Stain   Final    ABUNDANT WBC PRESENT, PREDOMINANTLY PMN RARE GRAM POSITIVE COCCI IN PAIRS IN CLUSTERS    Culture Consistent with normal respiratory flora.  Final   Report Status 09/16/2016 FINAL  Final  Respiratory Panel by PCR     Status: None   Collection Time: 09/14/16  4:45 PM  Result Value Ref Range Status   Adenovirus NOT DETECTED NOT DETECTED Final   Coronavirus 229E NOT DETECTED NOT DETECTED Final   Coronavirus HKU1 NOT DETECTED NOT DETECTED Final   Coronavirus NL63 NOT DETECTED NOT DETECTED Final   Coronavirus OC43 NOT DETECTED NOT DETECTED Final   Metapneumovirus NOT DETECTED NOT DETECTED Final   Rhinovirus / Enterovirus NOT DETECTED NOT DETECTED Final   Influenza A NOT DETECTED NOT DETECTED Final   Influenza B NOT DETECTED NOT DETECTED Final   Parainfluenza Virus 1 NOT DETECTED NOT DETECTED Final   Parainfluenza Virus 2 NOT DETECTED NOT DETECTED  Final   Parainfluenza Virus 3 NOT DETECTED NOT DETECTED Final   Parainfluenza Virus 4 NOT DETECTED NOT DETECTED Final   Respiratory Syncytial Virus NOT DETECTED NOT DETECTED Final   Bordetella pertussis NOT DETECTED NOT DETECTED Final   Chlamydophila pneumoniae NOT DETECTED NOT DETECTED Final   Mycoplasma pneumoniae NOT DETECTED NOT DETECTED Final     Labs: CBC:  Recent Labs Lab 09/13/16 0930 09/14/16 1211 09/15/16 0830 09/16/16 0432 09/17/16 0456  WBC 16.3* 10.6* 6.8 5.7 5.8  NEUTROABS 12.8*  --   --  3.5  --   HGB 13.8 11.9* 11.4* 11.7* 12.3*  HCT 40.7 36.8* 35.9* 35.0* 36.7*  MCV 85.0 86.2 85.3 84.7 84.6  PLT 174 137* 134* 143* 127   Basic Metabolic Panel:  Recent Labs Lab 09/13/16 0930 09/13/16 1751 09/14/16 1211 09/15/16 0830 09/16/16 0432 09/17/16 0456  NA 136  --  137 138 138 134*  K 3.2*  --  3.2* 2.9* 3.2* 4.0  CL 103  --  105 106 107 101  CO2 21*  --  23 22 23 24   GLUCOSE 135*  --  152* 126* 116* 107*  BUN 14  --  14 11 8  <5*  CREATININE 1.75*  --  1.66* 1.69* 1.47* 1.42*  CALCIUM 8.2*  --  7.4* 7.5* 7.7* 7.9*  MG  --  1.2*  --   --  1.1* 1.8  PHOS  --  3.6  --   --   --   --    Liver Function Tests:  Recent Labs Lab 09/13/16 0930 09/14/16 1211 09/16/16 0432  AST 22 18 32  ALT 23 18 27   ALKPHOS 64 50 60  BILITOT 1.2 1.0 1.1  PROT 6.8 5.7* 5.5*  ALBUMIN 3.3* 2.7* 2.6*   BNP (last 3 results)  Recent Labs  09/05/16 0039  BNP 40.7   Cardiac Enzymes:  Recent Labs Lab 09/13/16 1751  TROPONINI <0.03   Urinalysis    Component Value Date/Time   COLORURINE YELLOW 09/13/2016 1330   APPEARANCEUR HAZY (A) 09/13/2016 1330   LABSPEC 1.014 09/13/2016 1330   PHURINE 5.0 09/13/2016 1330   GLUCOSEU NEGATIVE 09/13/2016 1330   HGBUR NEGATIVE 09/13/2016 1330   BILIRUBINUR NEGATIVE 09/13/2016 1330   KETONESUR NEGATIVE 09/13/2016 1330   PROTEINUR 30 (A) 09/13/2016 1330   NITRITE POSITIVE (A) 09/13/2016  1330   LEUKOCYTESUR SMALL (A) 09/13/2016  1330      Time coordinating discharge: Over 30 minutes  SIGNED:  Vernell Leep, MD, FACP, [place]. [facility] Pager [phone]  If 7PM-7AM, please contact night-coverage [email or web address] Password [facility] 09/18/2016, 3:32 PM

## 2016-09-18 NOTE — Progress Notes (Signed)
Physical Therapy Treatment Patient Details Name: Bill Dunn MRN: [id number] DOB: Jan 21, 1936 Today's Date: 09/18/2016    History of Present Illness  Bill Dunn is a 81 y.o. male  with past mental history significant for bladder cancer, chronic bronchitis, CK D, Crohn's disease, hypertension, pericarditis, super ventricular tachycardia, TIA. spinal fusion with complications and recent discharge from the hospital with diagnoses of small bowel obstruction and Crohn's disease presents to the hospital with chief complaint of acute encephalopathy.    PT Comments    Pt seen for mobility progression; however, ambulation distance limited secondary to R knee pain. Pt would continue to benefit from skilled physical therapy services at this time while admitted and after d/c to address the below listed limitations in order to improve overall safety and independence with functional mobility.    Follow Up Recommendations  Outpatient PT     Equipment Recommendations  None recommended by PT    Recommendations for Other Services       Precautions / Restrictions Precautions Precautions: Fall Precaution Comments: pt reports his legs give away without warning.  Restrictions Weight Bearing Restrictions: No    Mobility  Bed Mobility               General bed mobility comments: pt sitting OOB in recliner chair when therapist arrived  Transfers Overall transfer level: Needs assistance Equipment used: Rolling walker (2 wheeled) Transfers: Sit to/from Stand Sit to Stand: Min guard         General transfer comment: good technique, min guard for safety  Ambulation/Gait Ambulation/Gait assistance: Min guard Ambulation Distance (Feet): 50 Feet Assistive device: Rolling walker (2 wheeled) Gait Pattern/deviations: Step-through pattern;Decreased stride length;Trunk flexed Gait velocity: decreased   General Gait Details: mild instability but no LOB or need for physical assistance, min  guard for safety. Distance limited secondary to R knee pain   Stairs            Wheelchair Mobility    Modified Rankin (Stroke Patients Only)       Balance Overall balance assessment: Needs assistance Sitting-balance support: Feet supported Sitting balance-Leahy Scale: Good     Standing balance support: During functional activity;Bilateral upper extremity supported Standing balance-Leahy Scale: Poor Standing balance comment: pt reliant on bilateral UE supports on RW                            Cognition Arousal/Alertness: Awake/alert Behavior During Therapy: WFL for tasks assessed/performed Overall Cognitive Status: Within Functional Limits for tasks assessed                                        Exercises General Exercises - Lower Extremity Hip Flexion/Marching: AROM;Strengthening;Both;20 reps;Standing Mini-Sqauts: AROM;Strengthening;Both;10 reps;Standing    General Comments        Pertinent Vitals/Pain Pain Assessment: Faces Faces Pain Scale: Hurts little more Pain Location: R knee Pain Descriptors / Indicators: Aching Pain Intervention(s): Monitored during session;Repositioned    Home Living                      Prior Function            PT Goals (current goals can now be found in the care plan section) Acute Rehab PT Goals PT Goal Formulation: With patient Time For Goal Achievement: 09/30/16 Potential to Achieve Goals: Fair Progress towards PT  goals: Progressing toward goals    Frequency    Min 3X/week      PT Plan Current plan remains appropriate    Co-evaluation              AM-PAC PT "6 Clicks" Daily Activity  Outcome Measure  Difficulty turning over in bed (including adjusting bedclothes, sheets and blankets)?: A Little Difficulty moving from lying on back to sitting on the side of the bed? : A Little Difficulty sitting down on and standing up from a chair with arms (e.g., wheelchair,  bedside commode, etc,.)?: A Little Help needed moving to and from a bed to chair (including a wheelchair)?: A Little Help needed walking in hospital room?: A Little Help needed climbing 3-5 steps with a railing? : A Lot 6 Click Score: 17    End of Session Equipment Utilized During Treatment: Gait belt Activity Tolerance: Patient tolerated treatment well Patient left: in chair;with call bell/phone within reach Nurse Communication: Mobility status PT Visit Diagnosis: Difficulty in walking, not elsewhere classified (R26.2);Muscle weakness (generalized) (M62.81)     Time: 8101-7510 PT Time Calculation (min) (ACUTE ONLY): 24 min  Charges:  $Gait Training: 8-22 mins $Therapeutic Exercise: 8-22 mins                    G Codes:       [place], [place], [place] 09/18/2016, 1:05 PM

## 2016-09-18 NOTE — Progress Notes (Signed)
Nsg Discharge Note  Admit Date:  09/13/2016 Discharge date: 09/18/2016   Lafe Garin to be D/C'd Home per MD order.  AVS completed.  Copy for chart, and copy for patient signed, and dated. Patient/caregiver able to verbalize understanding.  Discharge Medication:   Discharge Assessment: Vitals:   09/18/16 0627 09/18/16 1411  BP: (!) 171/86 114/65  Pulse: 72 72  Resp: 18 18  Temp: 98.4 F (36.9 C) 97.5 F (36.4 C)   Skin clean, dry and intact without evidence of skin break down, no evidence of skin tears noted. IV catheter discontinued intact. Site without signs and symptoms of complications - no redness or edema noted at insertion site, patient denies c/o pain - only slight tenderness at site.  Dressing with slight pressure applied.  D/c Instructions-Education: Discharge instructions given to patient/family with verbalized understanding. D/c education completed with patient/family including follow up instructions, medication list, d/c activities limitations if indicated, with other d/c instructions as indicated by MD - patient able to verbalize understanding, all questions fully answered. Patient instructed to return to ED, call 911, or call MD for any changes in condition.  Patient escorted via [place], and D/C home via private auto.  Desteny Freeman Margaretha Sheffield, RN 09/18/2016 5:22 PM

## 2016-11-05 ENCOUNTER — Encounter (HOSPITAL_COMMUNITY): Payer: Self-pay

## 2016-11-05 ENCOUNTER — Inpatient Hospital Stay (HOSPITAL_COMMUNITY)
Admission: EM | Admit: 2016-11-05 | Discharge: 2016-11-10 | DRG: 690 | Disposition: A | Discharge status: Home Health | Payer: Medicare Other | Attending: Internal Medicine | Admitting: Internal Medicine

## 2016-11-05 DIAGNOSIS — Z885 Allergy status to narcotic agent status: Secondary | ICD-10-CM | POA: Diagnosis not present

## 2016-11-05 DIAGNOSIS — R109 Unspecified abdominal pain: Secondary | ICD-10-CM

## 2016-11-05 DIAGNOSIS — Z8249 Family history of ischemic heart disease and other diseases of the circulatory system: Secondary | ICD-10-CM | POA: Diagnosis not present

## 2016-11-05 DIAGNOSIS — N3090 Cystitis, unspecified without hematuria: Secondary | ICD-10-CM | POA: Insufficient documentation

## 2016-11-05 DIAGNOSIS — N3001 Acute cystitis with hematuria: Secondary | ICD-10-CM | POA: Diagnosis present

## 2016-11-05 DIAGNOSIS — K5090 Crohn's disease, unspecified, without complications: Secondary | ICD-10-CM | POA: Diagnosis present

## 2016-11-05 DIAGNOSIS — Z888 Allergy status to other drugs, medicaments and biological substances status: Secondary | ICD-10-CM

## 2016-11-05 DIAGNOSIS — E781 Pure hyperglyceridemia: Secondary | ICD-10-CM | POA: Diagnosis present

## 2016-11-05 DIAGNOSIS — A0472 Enterocolitis due to Clostridium difficile, not specified as recurrent: Secondary | ICD-10-CM | POA: Diagnosis present

## 2016-11-05 DIAGNOSIS — Z9889 Other specified postprocedural states: Secondary | ICD-10-CM

## 2016-11-05 DIAGNOSIS — Z8719 Personal history of other diseases of the digestive system: Secondary | ICD-10-CM | POA: Diagnosis not present

## 2016-11-05 DIAGNOSIS — Z882 Allergy status to sulfonamides status: Secondary | ICD-10-CM

## 2016-11-05 DIAGNOSIS — R509 Fever, unspecified: Secondary | ICD-10-CM | POA: Diagnosis present

## 2016-11-05 DIAGNOSIS — Z792 Long term (current) use of antibiotics: Secondary | ICD-10-CM

## 2016-11-05 DIAGNOSIS — N183 Chronic kidney disease, stage 3 unspecified: Secondary | ICD-10-CM | POA: Diagnosis present

## 2016-11-05 DIAGNOSIS — Z8679 Personal history of other diseases of the circulatory system: Secondary | ICD-10-CM | POA: Diagnosis not present

## 2016-11-05 DIAGNOSIS — R5081 Fever presenting with conditions classified elsewhere: Secondary | ICD-10-CM | POA: Diagnosis not present

## 2016-11-05 DIAGNOSIS — Z8673 Personal history of transient ischemic attack (TIA), and cerebral infarction without residual deficits: Secondary | ICD-10-CM | POA: Diagnosis not present

## 2016-11-05 DIAGNOSIS — R1030 Lower abdominal pain, unspecified: Secondary | ICD-10-CM | POA: Diagnosis not present

## 2016-11-05 DIAGNOSIS — Z66 Do not resuscitate: Secondary | ICD-10-CM | POA: Diagnosis present

## 2016-11-05 DIAGNOSIS — K219 Gastro-esophageal reflux disease without esophagitis: Secondary | ICD-10-CM | POA: Diagnosis present

## 2016-11-05 DIAGNOSIS — E785 Hyperlipidemia, unspecified: Secondary | ICD-10-CM | POA: Diagnosis present

## 2016-11-05 DIAGNOSIS — Z6836 Body mass index (BMI) 36.0-36.9, adult: Secondary | ICD-10-CM | POA: Diagnosis not present

## 2016-11-05 DIAGNOSIS — Z87891 Personal history of nicotine dependence: Secondary | ICD-10-CM | POA: Diagnosis not present

## 2016-11-05 DIAGNOSIS — Z881 Allergy status to other antibiotic agents status: Secondary | ICD-10-CM | POA: Diagnosis not present

## 2016-11-05 DIAGNOSIS — R319 Hematuria, unspecified: Secondary | ICD-10-CM

## 2016-11-05 DIAGNOSIS — N390 Urinary tract infection, site not specified: Secondary | ICD-10-CM | POA: Diagnosis not present

## 2016-11-05 DIAGNOSIS — Z8551 Personal history of malignant neoplasm of bladder: Secondary | ICD-10-CM | POA: Diagnosis not present

## 2016-11-05 DIAGNOSIS — Z8619 Personal history of other infectious and parasitic diseases: Secondary | ICD-10-CM

## 2016-11-05 DIAGNOSIS — I129 Hypertensive chronic kidney disease with stage 1 through stage 4 chronic kidney disease, or unspecified chronic kidney disease: Secondary | ICD-10-CM | POA: Diagnosis present

## 2016-11-05 DIAGNOSIS — I2510 Atherosclerotic heart disease of native coronary artery without angina pectoris: Secondary | ICD-10-CM | POA: Diagnosis present

## 2016-11-05 DIAGNOSIS — E860 Dehydration: Secondary | ICD-10-CM | POA: Diagnosis present

## 2016-11-05 DIAGNOSIS — R4182 Altered mental status, unspecified: Secondary | ICD-10-CM

## 2016-11-05 DIAGNOSIS — N179 Acute kidney failure, unspecified: Secondary | ICD-10-CM | POA: Diagnosis present

## 2016-11-05 DIAGNOSIS — R55 Syncope and collapse: Secondary | ICD-10-CM | POA: Diagnosis not present

## 2016-11-05 DIAGNOSIS — K50918 Crohn's disease, unspecified, with other complication: Secondary | ICD-10-CM | POA: Diagnosis not present

## 2016-11-05 DIAGNOSIS — B9620 Unspecified Escherichia coli [E. coli] as the cause of diseases classified elsewhere: Secondary | ICD-10-CM | POA: Diagnosis present

## 2016-11-05 LAB — I-STAT CG4 LACTIC ACID, ED
LACTIC ACID, VENOUS: 1.55 mmol/L (ref 0.5–1.9)
Lactic Acid, Venous: 1.67 mmol/L (ref 0.5–1.9)

## 2016-11-05 LAB — COMPREHENSIVE METABOLIC PANEL
ALT: 23 U/L (ref 17–63)
AST: 18 U/L (ref 15–41)
Albumin: 3.8 g/dL (ref 3.5–5.0)
Alkaline Phosphatase: 46 U/L (ref 38–126)
Anion gap: 11 (ref 5–15)
BILIRUBIN TOTAL: 1.2 mg/dL (ref 0.3–1.2)
BUN: 32 mg/dL — ABNORMAL HIGH (ref 6–20)
CO2: 23 mmol/L (ref 22–32)
Calcium: 8.7 mg/dL — ABNORMAL LOW (ref 8.9–10.3)
Chloride: 102 mmol/L (ref 101–111)
Creatinine, Ser: 2.06 mg/dL — ABNORMAL HIGH (ref 0.61–1.24)
GFR calc non Af Amer: 29 mL/min — ABNORMAL LOW (ref >60)
GFR, EST AFRICAN AMERICAN: 34 mL/min — AB (ref >60)
Glucose, Bld: 106 mg/dL — ABNORMAL HIGH (ref 65–99)
POTASSIUM: 4.3 mmol/L (ref 3.5–5.1)
Sodium: 136 mmol/L (ref 135–145)
Total Protein: 7.3 g/dL (ref 6.5–8.1)

## 2016-11-05 LAB — CBC WITH DIFFERENTIAL/PLATELET
Basophils Absolute: 0 10*3/uL (ref 0.0–0.1)
Basophils Relative: 0 %
Eosinophils Absolute: 0.1 10*3/uL (ref 0.0–0.7)
Eosinophils Relative: 1 %
HEMATOCRIT: 43 % (ref 39.0–52.0)
Hemoglobin: 13.6 g/dL (ref 13.0–17.0)
Lymphocytes Relative: 27 %
Lymphs Abs: 3.2 10*3/uL (ref 0.7–4.0)
MCH: 27.1 pg (ref 26.0–34.0)
MCHC: 31.6 g/dL (ref 30.0–36.0)
MCV: 85.7 fL (ref 78.0–100.0)
MONO ABS: 1.1 10*3/uL — AB (ref 0.1–1.0)
MONOS PCT: 9 %
Neutro Abs: 7.6 10*3/uL (ref 1.7–7.7)
Neutrophils Relative %: 63 %
PLATELETS: 137 10*3/uL — AB (ref 150–400)
RBC: 5.02 MIL/uL (ref 4.22–5.81)
RDW: 15 % (ref 11.5–15.5)
WBC: 12 10*3/uL — AB (ref 4.0–10.5)

## 2016-11-05 LAB — URINALYSIS, ROUTINE W REFLEX MICROSCOPIC
BILIRUBIN URINE: NEGATIVE
GLUCOSE, UA: NEGATIVE mg/dL
KETONES UR: NEGATIVE mg/dL
NITRITE: POSITIVE — AB
PH: 5 (ref 5.0–8.0)
PROTEIN: 30 mg/dL — AB
Specific Gravity, Urine: 1.013 (ref 1.005–1.030)

## 2016-11-05 MED ORDER — CEFEPIME HCL 2 G IJ SOLR
2.0000 g | Freq: Once | INTRAMUSCULAR | Status: AC
  Administered 2016-11-05: 2 g via INTRAVENOUS
  Filled 2016-11-05: qty 2

## 2016-11-05 MED ORDER — DIPHENHYDRAMINE HCL 25 MG PO TABS
25.0000 mg | ORAL_TABLET | Freq: Four times a day (QID) | ORAL | Status: DC | PRN
  Filled 2016-11-05: qty 1

## 2016-11-05 MED ORDER — PANTOPRAZOLE SODIUM 40 MG PO TBEC
40.0000 mg | DELAYED_RELEASE_TABLET | Freq: Every day | ORAL | Status: DC
  Administered 2016-11-06 – 2016-11-10 (×5): 40 mg via ORAL
  Filled 2016-11-05 (×5): qty 1

## 2016-11-05 MED ORDER — DIPHENOXYLATE-ATROPINE 2.5-0.025 MG PO TABS
2.0000 | ORAL_TABLET | Freq: Four times a day (QID) | ORAL | Status: DC | PRN
  Administered 2016-11-10: 2 via ORAL
  Filled 2016-11-05: qty 2

## 2016-11-05 MED ORDER — ONDANSETRON HCL 4 MG PO TABS: 4.0000 mg | ORAL_TABLET | Freq: Four times a day (QID) | ORAL | Status: DC | PRN

## 2016-11-05 MED ORDER — ONDANSETRON HCL 4 MG/2ML IJ SOLN
4.0000 mg | Freq: Once | INTRAMUSCULAR | Status: AC
  Administered 2016-11-05: 4 mg via INTRAVENOUS
  Filled 2016-11-05: qty 2

## 2016-11-05 MED ORDER — ONDANSETRON HCL 4 MG/2ML IJ SOLN
4.0000 mg | Freq: Four times a day (QID) | INTRAMUSCULAR | Status: DC | PRN
  Administered 2016-11-07: 4 mg via INTRAVENOUS
  Filled 2016-11-05: qty 2

## 2016-11-05 MED ORDER — ATORVASTATIN CALCIUM 40 MG PO TABS
40.0000 mg | ORAL_TABLET | Freq: Every day | ORAL | Status: DC
  Administered 2016-11-05 – 2016-11-09 (×5): 40 mg via ORAL
  Filled 2016-11-05 (×5): qty 1

## 2016-11-05 MED ORDER — HYDROCODONE-ACETAMINOPHEN 5-325 MG PO TABS
1.0000 | ORAL_TABLET | Freq: Four times a day (QID) | ORAL | Status: DC | PRN
  Administered 2016-11-05 – 2016-11-06 (×2): 2 via ORAL
  Filled 2016-11-05 (×2): qty 2

## 2016-11-05 MED ORDER — VANCOMYCIN 50 MG/ML ORAL SOLUTION
125.0000 mg | Freq: Four times a day (QID) | ORAL | Status: DC
  Administered 2016-11-06 – 2016-11-10 (×17): 125 mg via ORAL
  Filled 2016-11-05 (×23): qty 2.5

## 2016-11-05 MED ORDER — METOPROLOL SUCCINATE ER 100 MG PO TB24: 200.0000 mg | ORAL_TABLET | Freq: Every day | ORAL | Status: DC

## 2016-11-05 MED ORDER — MONTELUKAST SODIUM 10 MG PO TABS
10.0000 mg | ORAL_TABLET | Freq: Every day | ORAL | Status: DC
Start: 2016-11-05 — End: 2016-11-10
  Administered 2016-11-05 – 2016-11-09 (×5): 10 mg via ORAL
  Filled 2016-11-05 (×5): qty 1

## 2016-11-05 MED ORDER — DEXTROSE 5 % IV SOLN
1.0000 g | INTRAVENOUS | Status: DC
  Administered 2016-11-06 – 2016-11-07 (×2): 1 g via INTRAVENOUS
  Filled 2016-11-05 (×3): qty 1

## 2016-11-05 MED ORDER — AMLODIPINE BESYLATE 10 MG PO TABS
10.0000 mg | ORAL_TABLET | Freq: Every day | ORAL | Status: DC
  Administered 2016-11-06 – 2016-11-10 (×4): 10 mg via ORAL
  Filled 2016-11-05 (×5): qty 1

## 2016-11-05 MED ORDER — HEPARIN SODIUM (PORCINE) 5000 UNIT/ML IJ SOLN
5000.0000 [IU] | Freq: Three times a day (TID) | INTRAMUSCULAR | Status: DC
  Administered 2016-11-05 – 2016-11-09 (×11): 5000 [IU] via SUBCUTANEOUS
  Filled 2016-11-05 (×12): qty 1

## 2016-11-05 MED ORDER — SACCHAROMYCES BOULARDII 250 MG PO CAPS
250.0000 mg | ORAL_CAPSULE | Freq: Two times a day (BID) | ORAL | Status: DC
  Administered 2016-11-05 – 2016-11-10 (×10): 250 mg via ORAL
  Filled 2016-11-05 (×10): qty 1

## 2016-11-05 MED ORDER — ALBUTEROL SULFATE HFA 108 (90 BASE) MCG/ACT IN AERS: 2.0000 | INHALATION_SPRAY | Freq: Four times a day (QID) | RESPIRATORY_TRACT | Status: DC | PRN

## 2016-11-05 MED ORDER — ACETAMINOPHEN 325 MG PO TABS: 650.0000 mg | ORAL_TABLET | Freq: Four times a day (QID) | ORAL | Status: DC | PRN

## 2016-11-05 MED ORDER — TRAZODONE HCL 100 MG PO TABS
200.0000 mg | ORAL_TABLET | Freq: Every day | ORAL | Status: DC
  Administered 2016-11-05 – 2016-11-09 (×5): 200 mg via ORAL
  Filled 2016-11-05 (×5): qty 2

## 2016-11-05 MED ORDER — METOPROLOL SUCCINATE ER 100 MG PO TB24
200.0000 mg | ORAL_TABLET | Freq: Every day | ORAL | Status: DC
  Administered 2016-11-06 – 2016-11-08 (×3): 200 mg via ORAL
  Filled 2016-11-05 (×6): qty 2

## 2016-11-05 MED ORDER — SODIUM CHLORIDE 0.9 % IV SOLN
INTRAVENOUS | Status: AC
  Administered 2016-11-05 – 2016-11-06 (×2): via INTRAVENOUS

## 2016-11-05 MED ORDER — ACETAMINOPHEN 650 MG RE SUPP: 650.0000 mg | Freq: Four times a day (QID) | RECTAL | Status: DC | PRN

## 2016-11-05 MED ORDER — NITROGLYCERIN 0.4 MG SL SUBL: 0.4000 mg | SUBLINGUAL_TABLET | SUBLINGUAL | Status: DC | PRN

## 2016-11-05 MED ORDER — SENNOSIDES-DOCUSATE SODIUM 8.6-50 MG PO TABS
1.0000 | ORAL_TABLET | Freq: Two times a day (BID) | ORAL | Status: DC
  Administered 2016-11-05 – 2016-11-08 (×6): 1 via ORAL
  Filled 2016-11-05 (×10): qty 1

## 2016-11-05 MED ORDER — HYDROMORPHONE HCL 1 MG/ML IJ SOLN
0.5000 mg | Freq: Once | INTRAMUSCULAR | Status: AC
  Administered 2016-11-05: 0.5 mg via INTRAVENOUS
  Filled 2016-11-05: qty 0.5

## 2016-11-05 MED ORDER — ALBUTEROL SULFATE (2.5 MG/3ML) 0.083% IN NEBU: 2.5000 mg | INHALATION_SOLUTION | Freq: Four times a day (QID) | RESPIRATORY_TRACT | Status: DC | PRN

## 2016-11-05 MED ORDER — COLESTIPOL HCL 1 G PO TABS
1.0000 g | ORAL_TABLET | Freq: Two times a day (BID) | ORAL | Status: DC
  Administered 2016-11-05 – 2016-11-10 (×10): 1 g via ORAL
  Filled 2016-11-05 (×10): qty 1

## 2016-11-05 NOTE — ED Triage Notes (Addendum)
Pt reports his doctor called to tell him his urine tested positive for e-coli. He was running a temp this morning at 7am, woke up in sweat and took Tylenol. He reports right flank pain.

## 2016-11-05 NOTE — ED Provider Notes (Signed)
[facility] DEPT Provider Note   CSN: [id number] Arrival date & time: 11/05/16  1106     History   Chief Complaint Chief Complaint  Patient presents with  . E-Coli in Urine    HPI Bill Dunn is a 81 y.o. male.  The history is provided by the patient. No language interpreter was used.  Fever   This is a new problem. The current episode started 12 to 24 hours ago. The problem has not changed since onset.The maximum temperature noted was 100 to 100.9 F. Pertinent negatives include no vomiting and no congestion. He has tried nothing for the symptoms. The treatment provided moderate relief.   Pt complains of fever and back pain.  Pt saw his urologist last week for cloudy urine.  Pt reports urologist called on Tuesday and told him that he has ecoli in his urine.  He was advised he needs to see infectious disease and was told to come to the hospital if fever or illness.  Pt's wife reports pt has had increased weakness, some mental confusion and a temperature last night.  Temp decreased with tylenol.  Pt has a history of bladder cancer treated 5 years ago at [facility]  He is followed here by [place]. Past Medical History:  Diagnosis Date  . AAA (abdominal aortic aneurysm) ([place])    a. s/p stent grafting in 2012, in [place], [place], per pt.  . Arthritis   . Bladder cancer ([place])   . Chronic bronchitis ([place])   . CKD (chronic kidney disease), stage III   . Crohn disease ([place])    a. s/p multiple bowel resections;  b. s/p portacath.  . Disability of walking    a. no feeling in right leg since surgical complication ~ 2297 - predominantly uses w/c.  Marland Kitchen Dysphagia    a. 03/2016 EGD: nl EGD. Esoph dil performed.  Marland Kitchen HTN (hypertension)   . Hypertriglyceridemia   . Lung nodule    a. RML -->stable dating back to 02/2014.  . Morbid obesity ([place])   . Pericarditis 04/13/2013  . Pleuritic chest pain    a. s/p reportedly nl cath in 2012, [place], [place];  b. 04/2013 Cath: LM nl, LAD 38m, D1 nl,  LCX nl, OM1 20, OM2 sm 52m, RCA 40p/m, 20d, EF 55-65%;  c. 04/2013 Echo: EF 55%, no rwma;  d. chest pain improved with colchicine.  . Sepsis ([place]) ? 2002  . SVT (supraventricular tachycardia) ([place])    a. 04/2013->bb therapy.  Marland Kitchen TIA (transient ischemic attack)    a. x 2, last ~ 2002    Patient Active Problem List   Diagnosis Date Noted  . Sepsis with multi-organ dysfunction ([place]) 09/13/2016  . Sepsis ([place]) 09/13/2016  . C. difficile diarrhea   . CKD (chronic kidney disease), stage III 09/05/2016  . Small bowel obstruction ([place])   . SBO (small bowel obstruction) ([place]) 09/04/2016  . Acute on chronic respiratory failure with hypoxia ([place]) 09/04/2016  . Fall   . Bladder cancer ([place]) 05/01/2016  . Other hyperlipidemia   . Gastroesophageal reflux disease without esophagitis   . NSVT (nonsustained ventricular tachycardia) ([place]) 04/28/2014  . Chest pain 04/27/2014  . Pleuritic chest pain 04/14/2013  . Crohn's disease ([place]) 04/14/2013  . HTN (hypertension) 04/14/2013  . Hypertriglyceridemia 04/14/2013  . History of AAA (abdominal aortic aneurysm) repair 04/14/2013  . Pericarditis 04/14/2013  . History of TIA (transient ischemic attack) 04/14/2013  . Acute pericarditis 04/13/2013    Past Surgical History:  Procedure Laterality Date  . ABDOMINAL AORTIC ANEURYSM REPAIR     a. reported stenting 2012  . APPENDECTOMY    . CHOLECYSTECTOMY    . COCHLEAR IMPLANT    . COLON SURGERY     a. multiple bowel resections 2/2 chrohn's dzs.  . INGUINAL HERNIA REPAIR    . LEFT HEART CATHETERIZATION WITH CORONARY ANGIOGRAM Bilateral 04/13/2013   Procedure: LEFT HEART CATHETERIZATION WITH CORONARY ANGIOGRAM;  Surgeon: Peter M Martinique, MD;  Location: [facility] CATH LAB;  Service: Cardiovascular;  Laterality: Bilateral;  . Left Shoulder Surgery    . RENAL CYST EXCISION    . spinal fusion         Home Medications    Prior to Admission medications   Medication Sig Start Date End Date Taking? Authorizing  Provider  albuterol (PROVENTIL HFA;VENTOLIN HFA) 108 (90 Base) MCG/ACT inhaler Inhale 2 puffs into the lungs every 6 (six) hours as needed for wheezing or shortness of breath.    [provider]  amLODipine (NORVASC) 10 MG tablet Take 1 tablet (10 mg total) by mouth daily. 04/28/14   Isaiah Serge, NP  atorvastatin (LIPITOR) 80 MG tablet Take 80 mg by mouth at bedtime.    [provider]  colestipol (COLESTID) 1 G tablet Take 1 g by mouth 2 (two) times daily.    [provider]  cyclobenzaprine (FLEXERIL) 5 MG tablet Take 5 mg by mouth 3 (three) times daily as needed for muscle spasms.    [provider]  HYDROcodone-acetaminophen (NORCO/VICODIN) 5-325 MG tablet Take 1 tablet by mouth every 8 (eight) hours as needed for moderate pain or severe pain. 09/18/16   Hongalgi, Lenis Dickinson, MD  metoprolol (TOPROL-XL) 200 MG 24 hr tablet Take 200 mg by mouth at bedtime.    [provider]  montelukast (SINGULAIR) 10 MG tablet Take 1 tablet (10 mg total) by mouth at bedtime. 05/02/16   Doreatha Lew, MD  nitroGLYCERIN (NITROSTAT) 0.4 MG SL tablet Place 1 tablet (0.4 mg total) under the tongue every 5 (five) minutes x 3 doses as needed for chest pain. 04/14/13   Rogelia Mire, NP  saccharomyces boulardii (FLORASTOR) 250 MG capsule Take 1 capsule (250 mg total) by mouth 2 (two) times daily. 09/18/16   Hongalgi, Lenis Dickinson, MD  traZODone (DESYREL) 100 MG tablet Take 1 tablet (100 mg total) by mouth at bedtime. 09/18/16   Hongalgi, Lenis Dickinson, MD  vancomycin (VANCOCIN) 125 MG capsule Take 1 capsule (125 mg total) by mouth 4 (four) times daily. Complete the course of antibiotics you received during prior hospital admission. 09/18/16   Hongalgi, Lenis Dickinson, MD    Family History Family History  Problem Relation Age of Onset  . CAD Father        died @ 14  . CAD Mother        died @ 75    Social History Social History  Substance Use Topics  . Smoking status: Former  Smoker    Quit date: 05/05/1991  . Smokeless tobacco: Former Systems developer  . Alcohol use No     Allergies   Amoxil [amoxicillin]; Augmentin [amoxicillin-pot clavulanate]; Dilaudid [hydromorphone hcl]; Reglan [metoclopramide]; Ciprofloxacin; Demerol [meperidine]; Infliximab; Morphine and related; Sulfamethoxazole-trimethoprim; Vitamin d analogs; Levaquin [levofloxacin]; and Ace inhibitors   Review of Systems Review of Systems  Constitutional: Positive for fever.  HENT: Negative for congestion.   Gastrointestinal: Negative for vomiting.  All other systems reviewed and are negative.    Physical Exam  Updated Vital Signs BP 130/78 (BP Location: Left Arm)   Pulse 64   Temp 98.4 F (36.9 C) (Oral)   Resp 20   Ht 5' 8.5" (1.74 m)   Wt 110.7 kg (244 lb)   SpO2 96%   BMI 36.56 kg/m   Physical Exam  Constitutional: He appears well-developed and well-nourished.  HENT:  Head: Normocephalic and atraumatic.  Eyes: Conjunctivae are normal.  Neck: Neck supple.  Cardiovascular: Normal rate and regular rhythm.   No murmur heard. Pulmonary/Chest: Effort normal and breath sounds normal. No respiratory distress.  Abdominal: Soft. There is no tenderness.  Musculoskeletal: He exhibits no edema.  Neurological: He is alert.  Skin: Skin is warm and dry.  Psychiatric: He has a normal mood and affect.  Nursing note and vitals reviewed.    ED Treatments / Results  Labs (all labs ordered are listed, but only abnormal results are displayed) Labs Reviewed  COMPREHENSIVE METABOLIC PANEL - Abnormal; Notable for the following:       Result Value   Glucose, Bld 106 (*)    BUN 32 (*)    Creatinine, Ser 2.06 (*)    Calcium 8.7 (*)    GFR calc non Af Amer 29 (*)    GFR calc Af Amer 34 (*)    All other components within normal limits  CBC WITH DIFFERENTIAL/PLATELET - Abnormal; Notable for the following:    WBC 12.0 (*)    Platelets 137 (*)    Monocytes Absolute 1.1 (*)    All other components within  normal limits  URINALYSIS, ROUTINE W REFLEX MICROSCOPIC  I-STAT CG4 LACTIC ACID, ED    EKG  EKG Interpretation None       Radiology No results found.  Procedures Procedures (including critical care time)  Medications Ordered in ED Medications - No data to display   Initial Impression / Assessment and Plan / ED Course  I have reviewed the triage vital signs and the nursing notes.  Pertinent labs & imaging results that were available during my care of the patient were reviewed by me and considered in my medical decision making (see chart for details).    I spoke to hospitalist who will admit.  I discussed with Dr. Megan Salon Infectious disease.   He advised continue vancomycin oral and cefepime Iv.  He advised pharmacy consult  Final Clinical Impressions(s) / ED Diagnoses   Final diagnoses:  Fever, unspecified fever cause  Urinary tract infection with hematuria, site unspecified    New Prescriptions New Prescriptions   No medications on file   Consult unassigned for admission   Sidney Ace 11/05/16 1628    Carmin Muskrat, MD 11/06/16 [phone]

## 2016-11-05 NOTE — ED Notes (Signed)
Pt currently undergoing Chemo for bladder cancer.

## 2016-11-05 NOTE — ED Notes (Signed)
Provider at the bedside.  

## 2016-11-05 NOTE — ED Notes (Signed)
Attempted Report x1.   

## 2016-11-05 NOTE — ED Notes (Signed)
No powerports available in ED Supply. Called [employer] and waiting for supples. Pt made aware of delay and verbalizes understanding.

## 2016-11-05 NOTE — ED Notes (Signed)
Pt and family reported that the patient had a power port. This RN assessed the situation and the port does not have the normal characteristics of a power port. Wife went to car at this time to get port information before this RN accesses.

## 2016-11-05 NOTE — Progress Notes (Signed)
Pharmacy Antibiotic Note  Bill Dunn is a 81 y.o. male admitted on 11/05/2016 with UTI.  Pharmacy has been consulted for cefepime dosing. Pt is afebrile and WBC is elevated at 12. SCr is elevated at 2.06. Lactic acid is <2.   Plan: Cefepime 2gm IV x 1 then 1gm IV Q24H F/u renal fxn, C&S, clinical status Narrow as able   Height: 5' 8.5" (174 cm) Weight: 244 lb (110.7 kg) IBW/kg (Calculated) : 69.55  Temp (24hrs), Avg:98.5 F (36.9 C), Min:98.4 F (36.9 C), Max:98.6 F (37 C)   Recent Labs Lab 11/05/16 1211 11/05/16 1216  WBC 12.0*  --   CREATININE 2.06*  --   LATICACIDVEN  --  1.67    Estimated Creatinine Clearance: 35.9 mL/min (A) (by C-G formula based on SCr of 2.06 mg/dL (H)).    Allergies  Allergen Reactions  . Amoxil [Amoxicillin] Other (See Comments)    REACTION: C-diff  . Augmentin [Amoxicillin-Pot Clavulanate] Other (See Comments)    REACTION: C-Diff  . Dilaudid [Hydromorphone Hcl] Shortness Of Breath and Other (See Comments)    Stomach pain - reaction to injection of dilaudid and reglan together  . Levaquin [Levofloxacin] Other (See Comments)    Joint pain  . Reglan [Metoclopramide] Shortness Of Breath, Other (See Comments) and Rash    Stomach pain - reaction to injection of dilaudid and reglan together Stomach pain - reaction to injection of dilaudid and reglan together  . Ciprofloxacin Diarrhea and Other (See Comments)    Caused C-diff  . Demerol [Meperidine] Nausea And Vomiting  . Infliximab Nausea Only    Reaction to Remicade/ flu like symptoms  . Morphine And Related Nausea And Vomiting  . Sulfamethoxazole-Trimethoprim Other (See Comments)    Caused renal failure in combination with Prednisone  . Vitamin D Analogs Rash  . Ace Inhibitors Cough    Reaction to ramipril    Antimicrobials this admission: Cefepime 7/5>>  Dose adjustments this admission: N/A  Microbiology results: Pending  Thank you for allowing pharmacy to be a part of this  patient's care.  Kelie Gainey, Rande Lawman 11/05/2016 4:34 PM

## 2016-11-05 NOTE — ED Notes (Signed)
Port not Identified as a power port. Access to be completed with Mini-Lock.   Admitting MD at the bedside

## 2016-11-05 NOTE — ED Notes (Signed)
Pt to be taken upstairs by EMT Quita Skye

## 2016-11-05 NOTE — ED Notes (Signed)
Wife reports that He was recently coming off of Vancomycin after being diagnosed with C-Dif and now he was positive for E-Coli.

## 2016-11-05 NOTE — H&P (Addendum)
History and Physical  Bill Dunn DOB: 10-27-1935 DOA: 11/05/2016  Referring physician: Saunders Revel PCP: [name], MD  Urologist: Dr. Nira Retort PCP: [facility]  Chief Complaint: fever  HPI: Bill Dunn is a 81 y.o. male presents with fever of 105 at home and flank pain and diagnosed with E coli UTI by his urologist in [place] in last couple of days ( no culture data available ).  Presents with low grade fever, abnormal urinalysis and elevated WBC.  He was noted to have some nausea, no emesis, no chills.  He is taking oral vancomycin for difficult recent case of c diff.  He was recently discharged from this hospital for a sepsis admission with subsequent C diff infection.  He follows up at [facility] for primary care.  Pt's wife reports pt has had increased weakness, some mental confusion and a temperature last night.  Temp decreased with tylenol.  Pt has a history of bladder cancer treated 5 years ago at [facility] clinic.    Severity of Illness: The appropriate patient status for this patient is INPATIENT. Inpatient status is judged to be reasonable and necessary in order to provide the required intensity of service to ensure the patient's safety. The patient's presenting symptoms, physical exam findings, and initial radiographic and laboratory data in the context of their chronic comorbidities is felt to place them at high risk for further clinical deterioration. Furthermore, it is not anticipated that the patient will be medically stable for discharge from the hospital within 2 midnights of admission. The following factors support the patient status of inpatient.   " The patient's presenting symptoms include fever, leukocytosis, weakness, mental status changes. " The worrisome physical exam findings include fever. " The initial radiographic and laboratory data are worrisome because of leukocytosis, pyuria. " The chronic co-morbidities include  see history below.  * I certify that at the point of admission it is my clinical judgment that the patient will require inpatient hospital care spanning beyond 2 midnights from the point of admission due to high intensity of service, high risk for further deterioration and high frequency of surveillance required.*   Review of Systems: All systems reviewed and apart from history of presenting illness, are negative.  Past Medical History:  Diagnosis Date  . AAA (abdominal aortic aneurysm) ([place])    a. s/p stent grafting in 2012, in [place], [place], per pt.  . Arthritis   . Bladder cancer ([place])   . Chronic bronchitis ([place])   . CKD (chronic kidney disease), stage III   . Crohn disease ([place])    a. s/p multiple bowel resections;  b. s/p portacath.  . Disability of walking    a. no feeling in right leg since surgical complication ~ 5573 - predominantly uses w/c.  Marland Kitchen Dysphagia    a. 03/2016 EGD: nl EGD. Esoph dil performed.  Marland Kitchen HTN (hypertension)   . Hypertriglyceridemia   . Lung nodule    a. RML -->stable dating back to 02/2014.  . Morbid obesity ([place])   . Pericarditis 04/13/2013  . Pleuritic chest pain    a. s/p reportedly nl cath in 2012, [place], [place];  b. 04/2013 Cath: LM nl, LAD 58m, D1 nl, LCX nl, OM1 20, OM2 sm 54m, RCA 40p/m, 20d, EF 55-65%;  c. 04/2013 Echo: EF 55%, no rwma;  d. chest pain improved with colchicine.  . Sepsis ([place]) ? 2002  . SVT (supraventricular tachycardia) ([place])    a. 04/2013->bb therapy.  Marland Kitchen  TIA (transient ischemic attack)    a. x 2, last ~ 2002   Past Surgical History:  Procedure Laterality Date  . ABDOMINAL AORTIC ANEURYSM REPAIR     a. reported stenting 2012  . APPENDECTOMY    . CHOLECYSTECTOMY    . COCHLEAR IMPLANT    . COLON SURGERY     a. multiple bowel resections 2/2 chrohn's dzs.  . INGUINAL HERNIA REPAIR    . LEFT HEART CATHETERIZATION WITH CORONARY ANGIOGRAM Bilateral 04/13/2013   Procedure: LEFT HEART CATHETERIZATION WITH CORONARY  ANGIOGRAM;  Surgeon: Peter M Martinique, MD;  Location: [facility] CATH LAB;  Service: Cardiovascular;  Laterality: Bilateral;  . Left Shoulder Surgery    . RENAL CYST EXCISION    . spinal fusion     Social History:  reports that he quit smoking about 25 years ago. He has quit using smokeless tobacco. He reports that he does not drink alcohol or use drugs.  Allergies  Allergen Reactions  . Amoxil [Amoxicillin] Other (See Comments)    REACTION: C-diff  . Augmentin [Amoxicillin-Pot Clavulanate] Other (See Comments)    REACTION: C-Diff  . Dilaudid [Hydromorphone Hcl] Shortness Of Breath and Other (See Comments)    Stomach pain - reaction to injection of dilaudid and reglan together  . Levaquin [Levofloxacin] Other (See Comments)    Joint pain  . Reglan [Metoclopramide] Shortness Of Breath, Other (See Comments) and Rash    Stomach pain - reaction to injection of dilaudid and reglan together Stomach pain - reaction to injection of dilaudid and reglan together  . Ciprofloxacin Diarrhea and Other (See Comments)    Caused C-diff  . Demerol [Meperidine] Nausea And Vomiting  . Infliximab Nausea Only    Reaction to Remicade/ flu like symptoms  . Morphine And Related Nausea And Vomiting  . Sulfamethoxazole-Trimethoprim Other (See Comments)    Caused renal failure in combination with Prednisone  . Vitamin D Analogs Rash  . Ace Inhibitors Cough    Reaction to ramipril    Family History  Problem Relation Age of Onset  . CAD Father        died @ 13  . CAD Mother        died @ 39    Prior to Admission medications   Medication Sig Start Date End Date Taking? Authorizing Provider  amLODipine (NORVASC) 10 MG tablet Take 1 tablet (10 mg total) by mouth daily. 04/28/14  Yes Isaiah Serge, NP  atorvastatin (LIPITOR) 80 MG tablet Take 40 mg by mouth at bedtime.    Yes [provider]  chlorpheniramine (CHLOR-TRIMETON) 4 MG tablet Take 4 mg by mouth 2 (two) times daily.   Yes [provider]  colestipol (COLESTID) 1 G tablet Take 1 g by mouth 2 (two) times daily.   Yes [provider]  diphenoxylate-atropine (LOMOTIL) 2.5-0.025 MG tablet Take 1 tablet by mouth 2 (two) times daily.   Yes [provider]  hydrochlorothiazide (HYDRODIURIL) 12.5 MG tablet Take 12.5 mg by mouth daily.   Yes [provider]  HYDROcodone-acetaminophen (NORCO/VICODIN) 5-325 MG tablet Take 1 tablet by mouth every 8 (eight) hours as needed for moderate pain or severe pain. 09/18/16  Yes Hongalgi, Lenis Dickinson, MD  metoprolol (TOPROL-XL) 200 MG 24 hr tablet Take 200 mg by mouth at bedtime.   Yes [provider]  montelukast (SINGULAIR) 10 MG tablet Take 1 tablet (10 mg total) by mouth at bedtime. 05/02/16  Yes Doreatha Lew, MD  nitroGLYCERIN Delilah Shan)  0.4 MG SL tablet Place 1 tablet (0.4 mg total) under the tongue every 5 (five) minutes x 3 doses as needed for chest pain. 04/14/13  Yes Rogelia Mire, NP  omeprazole (PRILOSEC) 20 MG capsule Take 20 mg by mouth 4 (four) times daily.   Yes [provider]  traZODone (DESYREL) 100 MG tablet Take 1 tablet (100 mg total) by mouth at bedtime. Patient taking differently: Take 200 mg by mouth at bedtime.  09/18/16  Yes Hongalgi, Lenis Dickinson, MD  vancomycin (VANCOCIN) 125 MG capsule Take 1 capsule (125 mg total) by mouth 4 (four) times daily. Complete the course of antibiotics you received during prior hospital admission. Patient taking differently: Take 125 mg by mouth 2 (two) times daily. Complete the course of antibiotics you received during prior hospital admission. 09/18/16  Yes Hongalgi, Lenis Dickinson, MD  albuterol (PROVENTIL HFA;VENTOLIN HFA) 108 (90 Base) MCG/ACT inhaler Inhale 2 puffs into the lungs every 6 (six) hours as needed for wheezing or shortness of breath.    [provider]  saccharomyces boulardii (FLORASTOR) 250 MG capsule Take 1 capsule (250 mg total) by mouth 2 (two) times daily. Patient not  taking: Reported on 11/05/2016 09/18/16   Modena Jansky, MD   Physical Exam: Vitals:   11/05/16 1515 11/05/16 1545 11/05/16 1600 11/05/16 1620  BP: 116/77 131/75 119/71   Pulse: 69 65 66   Resp: 18     Temp:    98.6 F (37 C)  TempSrc:      SpO2: 94% 96% 94%   Weight:      Height:         General exam: Moderately built and nourished patient, lying comfortably supine on the gurney in no obvious distress.  Head, eyes and ENT: Nontraumatic and normocephalic. Pupils equally reacting to light and accommodation. Oral mucosa dry.   Neck: Supple. No JVD, carotid bruit or thyromegaly.  Lymphatics: No lymphadenopathy.  Respiratory system: Clear to auscultation. No increased work of breathing.  Cardiovascular system: S1 and S2 heard, RRR. No JVD, murmurs, gallops, clicks or pedal edema.  Gastrointestinal system: Abdomen is nondistended, soft and mild suprapubic tenderness with palpation. Normal bowel sounds heard. No organomegaly or masses appreciated.  Central nervous system: Alert and oriented. No focal neurological deficits.  Extremities: Symmetric 5 x 5 power. Peripheral pulses symmetrically felt.   Skin: No rashes or acute findings.  Musculoskeletal system: Negative exam.  Psychiatry: Pleasant and cooperative.  Labs on Admission:  Basic Metabolic Panel:  Recent Labs Lab 11/05/16 1211  NA 136  K 4.3  CL 102  CO2 23  GLUCOSE 106*  BUN 32*  CREATININE 2.06*  CALCIUM 8.7*   Liver Function Tests:  Recent Labs Lab 11/05/16 1211  AST 18  ALT 23  ALKPHOS 46  BILITOT 1.2  PROT 7.3  ALBUMIN 3.8   No results for input(s): LIPASE, AMYLASE in the last 168 hours. No results for input(s): AMMONIA in the last 168 hours. CBC:  Recent Labs Lab 11/05/16 1211  WBC 12.0*  NEUTROABS 7.6  HGB 13.6  HCT 43.0  MCV 85.7  PLT 137*   Cardiac Enzymes: No results for input(s): CKTOTAL, CKMB, CKMBINDEX, TROPONINI in the last 168 hours.  BNP (last 3 results) No  results for input(s): PROBNP in the last 8760 hours. CBG: No results for input(s): GLUCAP in the last 168 hours.  Radiological Exams on Admission: No results found.  EKG: Independently reviewed.   Assessment/Plan Active Problems:   Cystitis  Acute renal failure (ARF) (HCC)   Dehydration   Fever  1. Acute cystitis - Admit for IV antibiotics, ID consult in ED recommending to start with Cefepime IV per pharmacy.  Obtain blood and urine cultures and adjust therapy based on findings.  Obtain old records from urologist Dr. Elnoria Howard in [place] for urine culture results.  Plan to treat and get him off antibiotics as soon as possible given his low tolerance to antibiotics and adverse events associated with taking them.  Appreciate help from ID specialist consulted during ED eval but will not follow in hospital unless needed. Norco for pain, laxatives ordered, tylenol for fever.  2. ARF - likely prerenal secondary to dehydration and polyuria from UTI, treat with gentle IVFs, follow renal function panel.  Avoid nephrotoxins and renally dose medications as appropriate.  3. Dehydration - treating as above with gentle hydration.  4. Fever - tylenol as needed, treating infection, trending lactate.   5. C. Diff infection - continue florastor and oral vancomycin, contact precautions.   6. Essential Hypertension - stable, see orders. Holding HCTZ secondary to ARF.  7. Hyperlipidemia - stable, resume home lipitor 40 mg daily and colestipol.   DVT Prophylaxis: heparin Code Status: DNR  Family Communication: wife  Disposition Plan: TBD  Time spent: 39 mins  Irwin Brakeman, MD [facility] Pager [phone]  If 7PM-7AM, please contact night-coverage [email or web address] Password [facility] 11/05/2016, 4:41 PM

## 2016-11-06 ENCOUNTER — Inpatient Hospital Stay (HOSPITAL_COMMUNITY): Payer: Medicare Other

## 2016-11-06 DIAGNOSIS — R5081 Fever presenting with conditions classified elsewhere: Secondary | ICD-10-CM

## 2016-11-06 DIAGNOSIS — E860 Dehydration: Secondary | ICD-10-CM

## 2016-11-06 DIAGNOSIS — N3090 Cystitis, unspecified without hematuria: Secondary | ICD-10-CM

## 2016-11-06 DIAGNOSIS — R509 Fever, unspecified: Secondary | ICD-10-CM

## 2016-11-06 DIAGNOSIS — R109 Unspecified abdominal pain: Secondary | ICD-10-CM

## 2016-11-06 DIAGNOSIS — A0472 Enterocolitis due to Clostridium difficile, not specified as recurrent: Secondary | ICD-10-CM

## 2016-11-06 LAB — BASIC METABOLIC PANEL
ANION GAP: 8 (ref 5–15)
BUN: 27 mg/dL — ABNORMAL HIGH (ref 6–20)
CALCIUM: 7.9 mg/dL — AB (ref 8.9–10.3)
CO2: 23 mmol/L (ref 22–32)
Chloride: 103 mmol/L (ref 101–111)
Creatinine, Ser: 1.82 mg/dL — ABNORMAL HIGH (ref 0.61–1.24)
GFR, EST AFRICAN AMERICAN: 39 mL/min — AB (ref >60)
GFR, EST NON AFRICAN AMERICAN: 34 mL/min — AB (ref >60)
Glucose, Bld: 121 mg/dL — ABNORMAL HIGH (ref 65–99)
POTASSIUM: 3.8 mmol/L (ref 3.5–5.1)
Sodium: 134 mmol/L — ABNORMAL LOW (ref 135–145)

## 2016-11-06 LAB — CBC
HCT: 38.4 % — ABNORMAL LOW (ref 39.0–52.0)
HEMOGLOBIN: 11.9 g/dL — AB (ref 13.0–17.0)
MCH: 26.7 pg (ref 26.0–34.0)
MCHC: 31 g/dL (ref 30.0–36.0)
MCV: 86.1 fL (ref 78.0–100.0)
Platelets: 113 10*3/uL — ABNORMAL LOW (ref 150–400)
RBC: 4.46 MIL/uL (ref 4.22–5.81)
RDW: 15 % (ref 11.5–15.5)
WBC: 8.6 10*3/uL (ref 4.0–10.5)

## 2016-11-06 MED ORDER — HYDROCODONE-ACETAMINOPHEN 7.5-325 MG PO TABS
1.0000 | ORAL_TABLET | Freq: Four times a day (QID) | ORAL | Status: DC | PRN
  Administered 2016-11-06 – 2016-11-07 (×2): 1 via ORAL
  Filled 2016-11-06 (×2): qty 1

## 2016-11-06 NOTE — Evaluation (Signed)
Occupational Therapy Evaluation Patient Details Name: Bill Dunn MRN: [id number] DOB: [birth year] Today's Date: 11/06/2016    History of Present Illness Bill Dunn is a 81 year old male with a past medical history of Crohn disease bladder cancer who presents to the hospital with fevers chills / UTI symptoms   Clinical Impression   Pt with decline in function and safety with ADL mobility, grooming in standing and toileting with decreased strength, balance and endurance.Marland Kitchen Pt previously independent with grooming and toileting. Pt at baseline level of function with all other ADLs. Pt would benefit from acute OT services to address impairments to increase level of function and safety to return home    Follow Up Recommendations  No OT follow up;Supervision/Assistance - 24 hour    Equipment Recommendations  None recommended by OT    Recommendations for Other Services       Precautions / Restrictions Precautions Precautions: Fall Restrictions Weight Bearing Restrictions: No      Mobility Bed Mobility Overal bed mobility: Needs Assistance Bed Mobility: Supine to Sit;Sit to Supine     Supine to sit: Modified independent (Device/Increase time) Sit to supine: Min assist   General bed mobility comments: increased time, used rails for sup - sit, min A with R LE back onto bed  Transfers Overall transfer level: Needs assistance Equipment used: Rolling walker (2 wheeled) Transfers: Sit to/from Stand Sit to Stand: Min assist         General transfer comment: cues for correct hand placement    Balance Overall balance assessment: Needs assistance Sitting-balance support: No upper extremity supported;Feet supported Sitting balance-Leahy Scale: Good     Standing balance support: Bilateral upper extremity supported;During functional activity Standing balance-Leahy Scale: Poor                             ADL either performed or assessed with clinical judgement   ADL  Overall ADL's : Needs assistance/impaired     Grooming: Wash/dry hands;Wash/dry face;Sitting;Brushing hair                   Toilet Transfer: Minimal assistance;RW;Cueing for safety (simulated)   Toileting- Clothing Manipulation and Hygiene: Moderate assistance;Sit to/from stand         General ADL Comments: Pt at baseline level of function with most ADLs; mod A with LB ADLs PLOF     Vision Baseline Vision/History: Wears glasses Wears Glasses: Reading only Patient Visual Report: No change from baseline                  Pertinent Vitals/Pain Pain Assessment: 0-10 Pain Score: 5  Pain Location: R side of back Pain Descriptors / Indicators: Sore Pain Intervention(s): Monitored during session;Repositioned     Hand Dominance Right   Extremity/Trunk Assessment Upper Extremity Assessment Upper Extremity Assessment: Generalized weakness   Lower Extremity Assessment Lower Extremity Assessment: Defer to PT evaluation   Cervical / Trunk Assessment Cervical / Trunk Assessment: Normal   Communication Communication Communication: HOH   Cognition Arousal/Alertness: Awake/alert Behavior During Therapy: WFL for tasks assessed/performed Overall Cognitive Status: Impaired/Different from baseline Area of Impairment: Memory                     Memory: Decreased short-term memory             General Comments   pt very pleasant and cooperative  Home Living Family/patient expects to be discharged to:: Private residence Living Arrangements: Spouse/significant other;Children;Other relatives Available Help at Discharge: Family;Available 24 hours/day Type of Home: House Home Access: Ramped entrance     Home Layout: Able to live on main level with bedroom/bathroom     Bathroom Shower/Tub: Hospital doctor Toilet: Handicapped height     Home Equipment: Wheelchair - power;Walker - 2 wheels;Shower seat;Cane - single  point;Wheelchair - manual;Grab bars - toilet;Grab bars - tub/shower;Hand held shower head;Adaptive equipment Adaptive Equipment: Reacher;Long-handled sponge;Long-handled shoe horn        Prior Functioning/Environment Level of Independence: Needs assistance  Gait / Transfers Assistance Needed: Pt uses power w/c, ambulates to the bathroom to the toilet. ADL's / Homemaking Assistance Needed: Pt requires A from wife to perform basic ADLs and funcitonal transfers            OT Problem List: Decreased activity tolerance;Decreased cognition;Pain;Impaired balance (sitting and/or standing)      OT Treatment/Interventions: Self-care/ADL training;DME and/or AE instruction;Therapeutic activities;Patient/family education    OT Goals(Current goals can be found in the care plan section) Acute Rehab OT Goals Patient Stated Goal: go home OT Goal Formulation: With patient/family Time For Goal Achievement: 11/13/16 Potential to Achieve Goals: Good ADL Goals Pt Will Perform Grooming: with min guard assist;standing Pt Will Transfer to Toilet: with min guard assist;bedside commode;ambulating;regular height toilet;grab bars Pt Will Perform Toileting - Clothing Manipulation and hygiene: with min assist;with min guard assist;sit to/from stand Additional ADL Goal #1: pt will participate in B UE HEP with level 2 theraband  OT Frequency: Min 2X/week   Barriers to D/C:    no barriers                     AM-PAC PT "6 Clicks" Daily Activity     Outcome Measure Help from another person eating meals?: None Help from another person taking care of personal grooming?: None Help from another person toileting, which includes using toliet, bedpan, or urinal?: A Little Help from another person bathing (including washing, rinsing, drying)?: A Lot Help from another person to put on and taking off regular upper body clothing?: None Help from another person to put on and taking off regular lower body  clothing?: A Lot 6 Click Score: 19   End of Session Equipment Utilized During Treatment: Rolling walker;Gait belt  Activity Tolerance: Patient limited by fatigue Patient left: in bed;with call bell/phone within reach;with bed alarm set  OT Visit Diagnosis: Unsteadiness on feet (R26.81);History of falling (Z91.81);Other abnormalities of gait and mobility (R26.89);Muscle weakness (generalized) (M62.81)                Time: 8638-1771 OT Time Calculation (min): 31 min Charges:  OT General Charges $OT Visit: 1 Procedure OT Evaluation $OT Eval Moderate Complexity: 1 Procedure OT Treatments $Therapeutic Activity: 8-22 mins G-Codes: OT G-codes **NOT FOR INPATIENT CLASS** Functional Assessment Tool Used: AM-PAC 6 Clicks Daily Activity     Britt Bottom 11/06/2016, 12:56 PM

## 2016-11-06 NOTE — Progress Notes (Signed)
PROGRESS NOTE    Bill Dunn  NWG:[id number] DOB: 1935-09-05 DOA: 11/05/2016 PCP: [name], MD      Brief Narrative:   Bill Dunn is a 81 year old male with a past medical history of Crohn disease bladder cancer who presents to the hospital with fevers chills / UTI symptoms.  Assessment & Plan:   1-complicated UTI. 2-history of C. difficile colitis. 3-history of Crohn disease. 4-acute on chronic renal failure. 5-history of coronary disease/hypertension/TIA/SVT.  We'll continue to hydrate him gently, continue IV cefepime and oral vancomycin, follow up with urine cultures, follow up with her kidney function continue to monitor for 1 more day.    DVT prophylaxis: (Lovenox) Code Status: DNR Family Communication: NONE. Disposition Plan:HOME.    Consultants:   ID  Procedures: (NONE )  Antimicrobials: (specify start and planned stop date. Auto populated tables are space occupying and do not give end dates)  Cefepime since 11/05/2016.  Oral vancomycin.   Subjective:  Bill Dunn is feeling much better his right upper quadrant pain is getting better ,   Burning with urination has resolved no more fevers.  Objective: Vitals:   11/05/16 1715 11/05/16 1812 11/05/16 2123 11/06/16 0605  BP: (!) 145/74 113/89 131/62 (!) 118/56  Pulse: 72 74 73 66  Resp: 20 18 18 18   Temp:  99.2 F (37.3 C) 98.9 F (37.2 C) 97.9 F (36.6 C)  TempSrc:   Oral Oral  SpO2: 94% 94% 93% 93%  Weight:  111.6 kg (246 lb)    Height:  5\' 8"  (1.727 m)      Intake/Output Summary (Last 24 hours) at 11/06/16 1231 Last data filed at 11/06/16 0844  Gross per 24 hour  Intake           1047.5 ml  Output             1100 ml  Net            -52.5 ml   Filed Weights   11/05/16 1136 11/05/16 1812  Weight: 110.7 kg (244 lb) 111.6 kg (246 lb)    Examination:  General exam: NAD. Respiratory system: CTAB. Cardiovascular system: S1 & S2 heard, RRR. No JVD, murmurs, rubs, gallops or  clicks. No pedal edema. Gastrointestinal system: Abdomen is nondistended, soft and nontender. No organomegaly or masses felt. Normal bowel sounds heard. Central nervous system: Alert and oriented. No focal neurological deficits. Extremities: Symmetric 5 x 5 power. Skin: No rashes, lesions or ulcers Psychiatry: Judgement and insight appear normal. Mood & affect appropriate.     Data Reviewed: I have personally reviewed following labs and imaging studies  CBC:  Recent Labs Lab 11/05/16 1211 11/06/16 0540  WBC 12.0* 8.6  NEUTROABS 7.6  --   HGB 13.6 11.9*  HCT 43.0 38.4*  MCV 85.7 86.1  PLT 137* 578*   Basic Metabolic Panel:  Recent Labs Lab 11/05/16 1211 11/06/16 0540  NA 136 134*  K 4.3 3.8  CL 102 103  CO2 23 23  GLUCOSE 106* 121*  BUN 32* 27*  CREATININE 2.06* 1.82*  CALCIUM 8.7* 7.9*   GFR: Estimated Creatinine Clearance: 40.5 mL/min (A) (by C-G formula based on SCr of 1.82 mg/dL (H)). Liver Function Tests:  Recent Labs Lab 11/05/16 1211  AST 18  ALT 23  ALKPHOS 46  BILITOT 1.2  PROT 7.3  ALBUMIN 3.8   No results for input(s): LIPASE, AMYLASE in the last 168 hours. No results for input(s): AMMONIA in the last 168 hours. Coagulation  Profile: No results for input(s): INR, PROTIME in the last 168 hours. Cardiac Enzymes: No results for input(s): CKTOTAL, CKMB, CKMBINDEX, TROPONINI in the last 168 hours. BNP (last 3 results) No results for input(s): PROBNP in the last 8760 hours. HbA1C: No results for input(s): HGBA1C in the last 72 hours. CBG: No results for input(s): GLUCAP in the last 168 hours. Lipid Profile: No results for input(s): CHOL, HDL, LDLCALC, TRIG, CHOLHDL, LDLDIRECT in the last 72 hours. Thyroid Function Tests: No results for input(s): TSH, T4TOTAL, FREET4, T3FREE, THYROIDAB in the last 72 hours. Anemia Panel: No results for input(s): VITAMINB12, FOLATE, FERRITIN, TIBC, IRON, RETICCTPCT in the last 72 hours. Urine analysis:      Component Value Date/Time   COLORURINE AMBER (A) 11/05/2016 1529   APPEARANCEUR TURBID (A) 11/05/2016 1529   LABSPEC 1.013 11/05/2016 1529   PHURINE 5.0 11/05/2016 1529   GLUCOSEU NEGATIVE 11/05/2016 1529   HGBUR SMALL (A) 11/05/2016 1529   BILIRUBINUR NEGATIVE 11/05/2016 1529   KETONESUR NEGATIVE 11/05/2016 1529   PROTEINUR 30 (A) 11/05/2016 1529   NITRITE POSITIVE (A) 11/05/2016 1529   LEUKOCYTESUR LARGE (A) 11/05/2016 1529   Sepsis Labs: @LABRCNTIP (procalcitonin:4,lacticidven:4)  )No results found for this or any previous visit (from the past 240 hour(s)).       Radiology Studies: No results found.      Scheduled Meds: . amLODipine  10 mg Oral Daily  . atorvastatin  40 mg Oral QHS  . colestipol  1 g Oral BID  . heparin  5,000 Units Subcutaneous Q8H  . metoprolol succinate  200 mg Oral Daily  . montelukast  10 mg Oral QHS  . pantoprazole  40 mg Oral Daily  . saccharomyces boulardii  250 mg Oral BID  . senna-docusate  1 tablet Oral BID  . traZODone  200 mg Oral QHS  . vancomycin  125 mg Oral Q6H   Continuous Infusions: . sodium chloride 75 mL/hr at 11/06/16 0646  . ceFEPime (MAXIPIME) IV       LOS: 1 day    Time spent: 35 minutes.    Waldron Session, MD [facility] Pager [phone].  If 7PM-7AM, please contact night-coverage [email or web address] Password [facility] 11/06/2016, 12:31 PM

## 2016-11-06 NOTE — Progress Notes (Signed)
Responded to [facility] consult to provide prayer and emotional support. Pt was alert and welcomed prayer. Will follow as needed.  Jaclynn Major, [place], [facility], Pager [phone]

## 2016-11-06 NOTE — Evaluation (Signed)
Physical Therapy Evaluation Patient Details Name: Bill Dunn MRN: [id number] DOB: Sep 17, 1935 Today's Date: 11/06/2016   History of Present Illness  [name] is a 81 year old male with a past medical history of TIA, SVT, HTN, CKD III, AAA, spinal fusion, crohn disease bladder cancer who presents to the hospital with fevers chills / UTI symptoms  Clinical Impression  PT is familiar with this pt from a previous admission.  He is close to baseline, but prolonged bedrest could make him weaker and not able to return home, so PT will follow acutely for activity and exercise progression while he is here, but does not recommend f/u at discharge.   PT to follow acutely for deficits listed below.       Follow Up Recommendations No PT follow up;Supervision for mobility/OOB    Equipment Recommendations  None recommended by PT    Recommendations for Other Services   NA    Precautions / Restrictions Precautions Precautions: Fall Restrictions Weight Bearing Restrictions: No      Mobility  Bed Mobility Overal bed mobility: Needs Assistance Bed Mobility: Supine to Sit     Supine to sit: Min assist;HOB elevated     General bed mobility comments: Min assist to help progress bil legs to EOB and give pt a hand to pull up to sitting EOB.  Pt using right hand to pull on bed rail while pulling on therapist with left hand.   Transfers Overall transfer level: Needs assistance Equipment used: Rolling walker (2 wheeled) Transfers: Sit to/from Stand Sit to Stand: Min assist         General transfer comment: Min assist to support trunk and stabilize RW during transitions.   Ambulation/Gait Ambulation/Gait assistance: Min assist Ambulation Distance (Feet): 40 Feet Assistive device: Rolling walker (2 wheeled) Gait Pattern/deviations: Step-through pattern;Shuffle Gait velocity: decreased Gait velocity interpretation: Below normal speed for age/gender General Gait Details: Pt with slow,  cautious gait speed, pt reports legs give away on him unexpectedly, so he uses his power chair mostly except for short trips to the bathroom.           Balance Overall balance assessment: Needs assistance Sitting-balance support: Feet supported;No upper extremity supported Sitting balance-Leahy Scale: Good     Standing balance support: Bilateral upper extremity supported Standing balance-Leahy Scale: Poor Standing balance comment: needs min assist and support from RW in standing.                              Pertinent Vitals/Pain Pain Assessment: No/denies pain Pain Score: 0-No pain    Home Living Family/patient expects to be discharged to:: Private residence Living Arrangements: Spouse/significant other;Children;Other relatives Available Help at Discharge: Family;Available 24 hours/day Type of Home: House Home Access: Ramped entrance     Home Layout: Able to live on main level with bedroom/bathroom Home Equipment: Wheelchair - power;Walker - 2 wheels;Shower seat;Cane - single point;Wheelchair - manual;Grab bars - toilet;Grab bars - tub/shower;Hand held shower head;Adaptive equipment      Prior Function Level of Independence: Needs assistance   Gait / Transfers Assistance Needed: Pt uses power w/c, ambulates to the bathroom to the toilet.  ADL's / Homemaking Assistance Needed: Pt requires A from wife to perform basic ADLs and funcitonal transfers        Hand Dominance   Dominant Hand: Right    Extremity/Trunk Assessment   Upper Extremity Assessment Upper Extremity Assessment: Defer to OT evaluation    Lower  Extremity Assessment Lower Extremity Assessment: Generalized weakness    Cervical / Trunk Assessment Cervical / Trunk Assessment: Other exceptions Cervical / Trunk Exceptions: h/o lumbar surgery  Communication   Communication: HOH  Cognition Arousal/Alertness: Awake/alert Behavior During Therapy: WFL for tasks assessed/performed Overall  Cognitive Status: Impaired/Different from baseline Area of Impairment: Memory                     Memory: Decreased short-term memory         General Comments: Pt reports feeling "foggy, like I am not thinking straight".  It also does not help that he is so HOH.               Assessment/Plan    PT Assessment Patient needs continued PT services  PT Problem List Decreased strength;Decreased activity tolerance;Decreased balance;Decreased mobility;Decreased knowledge of use of DME       PT Treatment Interventions DME instruction;Gait training;Functional mobility training;Therapeutic activities;Therapeutic exercise;Balance training;Patient/family education    PT Goals (Current goals can be found in the Care Plan section)  Acute Rehab PT Goals Patient Stated Goal: to get home, get well and get his head straight PT Goal Formulation: With patient Time For Goal Achievement: 11/20/16 Potential to Achieve Goals: Good    Frequency Min 3X/week        AM-PAC PT "6 Clicks" Daily Activity  Outcome Measure Difficulty turning over in bed (including adjusting bedclothes, sheets and blankets)?: None Difficulty moving from lying on back to sitting on the side of the bed? : Total Difficulty sitting down on and standing up from a chair with arms (e.g., wheelchair, bedside commode, etc,.)?: Total Help needed moving to and from a bed to chair (including a wheelchair)?: A Little Help needed walking in hospital room?: A Little Help needed climbing 3-5 steps with a railing? : A Lot 6 Click Score: 14    End of Session Equipment Utilized During Treatment: Gait belt Activity Tolerance: Patient limited by fatigue Patient left: in chair;with call bell/phone within reach;with chair alarm set   PT Visit Diagnosis: Muscle weakness (generalized) (M62.81);Difficulty in walking, not elsewhere classified (R26.2)    Time: 9390-3009 PT Time Calculation (min) (ACUTE ONLY): 24 min   Charges:          Wells Guiles B. Justun Anaya, PT, DPT [phone]   PT Evaluation $PT Eval Moderate Complexity: 1 Procedure PT Treatments $Gait Training: 8-22 mins   11/06/2016, 5:05 PM

## 2016-11-06 NOTE — Consult Note (Signed)
[facility] for Infectious Disease    Date of Admission:  11/05/2016           Day 1 cefepime        He has been on oral vancomycin recently               Reason for Consult: Fever to 105 and abdominal and right flank pain    Referring Provider: Dr. Irwin Brakeman  Assessment: I'm not certain what is causing his fever (none has been documented here) or recent right flank and abdominal pain. He tells me that he has been concerned that it might be due to his Crohn's disease. His urinalysis is abnormal and he recently had documented asymptomatic bacteriuria but it is not entirely clear to me that his current illness is due to right-sided pyelonephritis. He has been colonized with C. difficile but I doubt that his severe pain and temperature of 105 are due to active C. difficile colitis. He is due to go down for renal ultrasound.  Plan: 1. Continue cefepime and oral vancomycin 2. Await results of urine and blood cultures 3. Await results of renal ultrasound   Active Problems:   C. difficile diarrhea   Fever   Abdominal pain   Crohn's disease (HCC)   Hypertriglyceridemia   History of AAA (abdominal aortic aneurysm) repair   History of TIA (transient ischemic attack)   Gastroesophageal reflux disease without esophagitis   CKD (chronic kidney disease), stage III   Dehydration   HPI: Bill Dunn is a 81 y.o. male with a long-standing history of Crohn's disease. He has had multiple trips to the emergency department and admissions for complications including small bowel obstruction. He was last admitted here in May and treated conservatively. He says that he always has diarrhea. He was tested for C. difficile when he was hospitalized here in May. His C. difficile antigen was positive but his toxin was negative but he was started on oral vancomycin anyway. He has also been seeing Dr. Saul Fordyce, a urologist with [facility] in [place], [place]. He was last  there on 10/26/2016. He tells me that he was not having any urinary symptoms at that time. He gave a urine specimen that was noted to be cloudy and somewhat bloody. That culture grew a very sensitive Escherichia coli. He was told that he needed to see an infectious disease doctor. He does not believe that he was started on an antibiotic for UTI.  Over the past 3 weeks he has been waking up each morning around 2 AM with drenching sweats. He's also had right flank and abdominal pain. The pain is very sharp and stabbing and comes on intermittently. He does not know of anything that causes it to come on or get worse. It will last only seconds and resolve spontaneously. The abdominal pain radiates from right to left and down into his right groin. His pattern of diarrhea has not changed recently. He remains on oral vancomycin. He has not had any dysuria, frequency or hematuria that he is aware of. Last night he was brought here because he had a temperature of 105 and was confused.   Review of Systems: Review of Systems  Constitutional: Positive for diaphoresis, fever and malaise/fatigue. Negative for weight loss.  Respiratory: Positive for cough. Negative for sputum production and shortness of breath.        He has had intermittent cough for the past 6 months.  Cardiovascular: Negative for chest pain.  Gastrointestinal: Positive for abdominal pain and diarrhea. Negative for heartburn, nausea and vomiting.  Genitourinary: Positive for flank pain. Negative for dysuria, frequency, hematuria and urgency.  Skin: Negative for rash.    Past Medical History:  Diagnosis Date  . AAA (abdominal aortic aneurysm) ([place])    a. s/p stent grafting in 2012, in [place], [place], per pt.  . Arthritis   . Bladder cancer ([place])   . Chronic bronchitis ([place])   . CKD (chronic kidney disease), stage III   . Crohn disease ([place])    a. s/p multiple bowel resections;  b. s/p portacath.  . Disability of walking    a. no  feeling in right leg since surgical complication ~ 1093 - predominantly uses w/c.  Marland Kitchen Dysphagia    a. 03/2016 EGD: nl EGD. Esoph dil performed.  Marland Kitchen HTN (hypertension)   . Hypertriglyceridemia   . Lung nodule    a. RML -->stable dating back to 02/2014.  . Morbid obesity ([place])   . Pericarditis 04/13/2013  . Pleuritic chest pain    a. s/p reportedly nl cath in 2012, [place], [place];  b. 04/2013 Cath: LM nl, LAD 96m, D1 nl, LCX nl, OM1 20, OM2 sm 28m, RCA 40p/m, 20d, EF 55-65%;  c. 04/2013 Echo: EF 55%, no rwma;  d. chest pain improved with colchicine.  . Sepsis ([place]) ? 2002  . SVT (supraventricular tachycardia) ([place])    a. 04/2013->bb therapy.  Marland Kitchen TIA (transient ischemic attack)    a. x 2, last ~ 2002    Social History  Substance Use Topics  . Smoking status: Former Smoker    Quit date: 05/05/1991  . Smokeless tobacco: Former Systems developer  . Alcohol use No    Family History  Problem Relation Age of Onset  . CAD Father        died @ 21  . CAD Mother        died @ 79   Allergies  Allergen Reactions  . Amoxil [Amoxicillin] Other (See Comments)    REACTION: C-diff  . Augmentin [Amoxicillin-Pot Clavulanate] Other (See Comments)    REACTION: C-Diff  . Dilaudid [Hydromorphone Hcl] Shortness Of Breath and Other (See Comments)    Stomach pain - reaction to injection of dilaudid and reglan together  . Levaquin [Levofloxacin] Other (See Comments)    Joint pain  . Reglan [Metoclopramide] Shortness Of Breath, Other (See Comments) and Rash    Stomach pain - reaction to injection of dilaudid and reglan together Stomach pain - reaction to injection of dilaudid and reglan together  . Ciprofloxacin Diarrhea and Other (See Comments)    Caused C-diff  . Demerol [Meperidine] Nausea And Vomiting  . Infliximab Nausea Only    Reaction to Remicade/ flu like symptoms  . Morphine And Related Nausea And Vomiting  . Sulfamethoxazole-Trimethoprim Other (See Comments)    Caused renal failure in combination  with Prednisone  . Vitamin D Analogs Rash  . Ace Inhibitors Cough    Reaction to ramipril    OBJECTIVE: Blood pressure (!) 118/56, pulse 66, temperature 97.9 F (36.6 C), temperature source Oral, resp. rate 18, height 5\' 8"  (1.727 m), weight 246 lb (111.6 kg), SpO2 93 %.  Physical Exam  Constitutional: He is oriented to person, place, and time.  He is resting quietly in bed. He is somewhat confused and has trouble recalling some detail such as who he sees for his Crohn's disease. He has intermittent episodes where  he winces in pain and grabs his right abdomen.  Eyes: Conjunctivae are normal.  Neck: Neck supple.  Cardiovascular: Normal rate and regular rhythm.   No murmur heard. Distant heart sounds.  Pulmonary/Chest: Effort normal and breath sounds normal. He has no wheezes. He has no rales.  Abdominal: Soft. He exhibits distension. He exhibits no mass. There is no tenderness.  Musculoskeletal: Normal range of motion. He exhibits no edema or tenderness.  Neurological: He is alert and oriented to person, place, and time.  Skin: No rash noted.  Psychiatric: Mood and affect normal.    Lab Results Lab Results  Component Value Date   WBC 8.6 11/06/2016   HGB 11.9 (L) 11/06/2016   HCT 38.4 (L) 11/06/2016   MCV 86.1 11/06/2016   PLT 113 (L) 11/06/2016    Lab Results  Component Value Date   CREATININE 1.82 (H) 11/06/2016   BUN 27 (H) 11/06/2016   NA 134 (L) 11/06/2016   K 3.8 11/06/2016   CL 103 11/06/2016   CO2 23 11/06/2016    Lab Results  Component Value Date   ALT 23 11/05/2016   AST 18 11/05/2016   ALKPHOS 46 11/05/2016   BILITOT 1.2 11/05/2016     Microbiology: Recent Results (from the past 240 hour(s))  Culture, Urine     Status: None (Preliminary result)   Collection Time: 11/05/16  3:29 PM  Result Value Ref Range Status   Specimen Description URINE, CLEAN CATCH  Final   Special Requests NONE  Final   Culture CULTURE REINCUBATED FOR BETTER GROWTH  Final    Report Status PENDING  Incomplete  Culture, blood (Routine X 2) w Reflex to ID Panel     Status: None (Preliminary result)   Collection Time: 11/05/16  4:40 PM  Result Value Ref Range Status   Specimen Description BLOOD RIGHT ANTECUBITAL  Final   Special Requests   Final    BOTTLES DRAWN AEROBIC AND ANAEROBIC Blood Culture adequate volume   Culture NO GROWTH < 24 HOURS  Final   Report Status PENDING  Incomplete  Culture, blood (Routine X 2) w Reflex to ID Panel     Status: None (Preliminary result)   Collection Time: 11/05/16  6:56 PM  Result Value Ref Range Status   Specimen Description BLOOD RIGHT ANTECUBITAL  Final   Special Requests   Final    BOTTLES DRAWN AEROBIC AND ANAEROBIC Blood Culture adequate volume   Culture NO GROWTH < 24 HOURS  Final   Report Status PENDING  Incomplete    Michel Bickers, MD [facility] [phone] pager   [phone] cell 11/06/2016, 4:09 PM

## 2016-11-07 DIAGNOSIS — N390 Urinary tract infection, site not specified: Secondary | ICD-10-CM

## 2016-11-07 DIAGNOSIS — K50918 Crohn's disease, unspecified, with other complication: Secondary | ICD-10-CM

## 2016-11-07 DIAGNOSIS — R109 Unspecified abdominal pain: Secondary | ICD-10-CM

## 2016-11-07 DIAGNOSIS — N183 Chronic kidney disease, stage 3 (moderate): Secondary | ICD-10-CM

## 2016-11-07 LAB — COMPREHENSIVE METABOLIC PANEL
ALBUMIN: 2.7 g/dL — AB (ref 3.5–5.0)
ALK PHOS: 68 U/L (ref 38–126)
ALT: 25 U/L (ref 17–63)
AST: 23 U/L (ref 15–41)
Anion gap: 6 (ref 5–15)
BUN: 19 mg/dL (ref 6–20)
CALCIUM: 7.7 mg/dL — AB (ref 8.9–10.3)
CO2: 25 mmol/L (ref 22–32)
CREATININE: 1.76 mg/dL — AB (ref 0.61–1.24)
Chloride: 104 mmol/L (ref 101–111)
GFR calc Af Amer: 41 mL/min — ABNORMAL LOW (ref >60)
GFR calc non Af Amer: 35 mL/min — ABNORMAL LOW (ref >60)
GLUCOSE: 223 mg/dL — AB (ref 65–99)
Potassium: 3.6 mmol/L (ref 3.5–5.1)
SODIUM: 135 mmol/L (ref 135–145)
Total Bilirubin: 0.9 mg/dL (ref 0.3–1.2)
Total Protein: 5.7 g/dL — ABNORMAL LOW (ref 6.5–8.1)

## 2016-11-07 LAB — CBC
HCT: 36.4 % — ABNORMAL LOW (ref 39.0–52.0)
Hemoglobin: 11.6 g/dL — ABNORMAL LOW (ref 13.0–17.0)
MCH: 27.2 pg (ref 26.0–34.0)
MCHC: 31.9 g/dL (ref 30.0–36.0)
MCV: 85.2 fL (ref 78.0–100.0)
PLATELETS: 117 10*3/uL — AB (ref 150–400)
RBC: 4.27 MIL/uL (ref 4.22–5.81)
RDW: 14.8 % (ref 11.5–15.5)
WBC: 6.5 10*3/uL (ref 4.0–10.5)

## 2016-11-07 MED ORDER — TAMSULOSIN HCL 0.4 MG PO CAPS
0.4000 mg | ORAL_CAPSULE | Freq: Every day | ORAL | Status: DC
  Administered 2016-11-07 – 2016-11-09 (×3): 0.4 mg via ORAL
  Filled 2016-11-07 (×3): qty 1

## 2016-11-07 MED ORDER — ASPIRIN 325 MG PO TABS
325.0000 mg | ORAL_TABLET | Freq: Every day | ORAL | Status: DC
  Administered 2016-11-07 – 2016-11-09 (×3): 325 mg via ORAL
  Filled 2016-11-07 (×4): qty 1

## 2016-11-07 MED ORDER — SODIUM CHLORIDE 0.9 % IV SOLN
INTRAVENOUS | Status: DC
  Administered 2016-11-07 – 2016-11-09 (×3): via INTRAVENOUS

## 2016-11-07 MED ORDER — GUAIFENESIN 100 MG/5ML PO SOLN
5.0000 mL | ORAL | Status: DC | PRN
  Administered 2016-11-07 (×2): 100 mg via ORAL
  Filled 2016-11-07 (×2): qty 5

## 2016-11-07 MED ORDER — HYDROCODONE-ACETAMINOPHEN 10-325 MG PO TABS
1.0000 | ORAL_TABLET | Freq: Four times a day (QID) | ORAL | Status: DC | PRN
  Administered 2016-11-07 – 2016-11-10 (×7): 1 via ORAL
  Filled 2016-11-07 (×8): qty 1

## 2016-11-07 NOTE — Progress Notes (Signed)
[facility] for Infectious Disease    Date of Admission:  11/05/2016   Total days of antibiotics 3        Day 3 cefepime        Day 3 oral vanco           ID: Bill Dunn is a 81 y.o. male with fever, nightsweats, flank pain and dysuria Active Problems:   Crohn's disease ([place])   Hypertriglyceridemia   History of AAA (abdominal aortic aneurysm) repair   History of TIA (transient ischemic attack)   Gastroesophageal reflux disease without esophagitis   CKD (chronic kidney disease), stage III   C. difficile diarrhea   Dehydration   Fever   Abdominal pain    Subjective: Feel poorly with ongoing nausea, no vomiting but predominantly has ongoing moderate flank pain, bilaterally radiating across lower abdomen  Renal ultrasound did not suggest pyelonephritis  Medications:  . amLODipine  10 mg Oral Daily  . aspirin  325 mg Oral Daily  . atorvastatin  40 mg Oral QHS  . colestipol  1 g Oral BID  . heparin  5,000 Units Subcutaneous Q8H  . metoprolol succinate  200 mg Oral Daily  . montelukast  10 mg Oral QHS  . pantoprazole  40 mg Oral Daily  . saccharomyces boulardii  250 mg Oral BID  . senna-docusate  1 tablet Oral BID  . tamsulosin  0.4 mg Oral QPC supper  . traZODone  200 mg Oral QHS  . vancomycin  125 mg Oral Q6H    Objective: Vital signs in last 24 hours: Temp:  [98.6 F (37 C)-99.2 F (37.3 C)] 98.6 F (37 C) (07/07 0538) Pulse Rate:  [63-76] 63 (07/07 0538) Resp:  [18-20] 18 (07/07 0538) BP: (110-137)/(50-66) 117/50 (07/07 0538) SpO2:  [93 %-99 %] 96 % (07/07 0538) Physical Exam  Constitutional: He is oriented to person, place, and time. He appears well-developed and well-nourished. No distress.  HENT:  Mouth/Throat: Oropharynx is clear and moist. No oropharyngeal exudate.  Cardiovascular: Normal rate, regular rhythm and normal heart sounds. Exam reveals no gallop and no friction rub.  No murmur heard.  Pulmonary/Chest: Effort normal and breath sounds  normal. No respiratory distress. He has no wheezes.  Abdominal: Soft. Bowel sounds are normal. He exhibits no distension. There is no tenderness.  Back= bilateral flank pain Neurological: He is alert and oriented to person, place, and time.  Skin: Skin is warm and dry. No rash noted. No erythema.  Psychiatric: He has a normal mood and affect. His behavior is normal.    Lab Results  Recent Labs  11/06/16 0540 11/07/16 1014  WBC 8.6 6.5  HGB 11.9* 11.6*  HCT 38.4* 36.4*  NA 134* 135  K 3.8 3.6  CL 103 104  CO2 23 25  BUN 27* 19  CREATININE 1.82* 1.76*   Liver Panel  Recent Labs  11/05/16 1211 11/07/16 1014  PROT 7.3 5.7*  ALBUMIN 3.8 2.7*  AST 18 23  ALT 23 25  ALKPHOS 46 68  BILITOT 1.2 0.9    Microbiology: 7/5 blood cx ngtd 7/5 urine cx ecoli Studies/Results: US Renal  Result Date: 11/06/2016 CLINICAL DATA:  Acute on chronic renal failure EXAM: RENAL / URINARY TRACT ULTRASOUND COMPLETE COMPARISON:  09/16/2016 FINDINGS: Right Kidney: Length: 13.1 cm. Cortical thinning is noted. Multiple cystic lesions are seen similar those noted on prior CT evaluation. The largest of these measures 5.2 cm in greatest dimension. Left Kidney: Length: 13.7 cm.  Cortical thinning is noted. Multiple cysts are noted similar to that seen on prior CT. The largest of these measures 5.9 cm. Bladder: Well distended. Mild irregularity to the bladder wall is noted of uncertain significance. IMPRESSION: Bilateral renal cysts. Diffuse cortical thinning consistent with the underlying chronic renal failure. Mild irregularity to the bladder wall of uncertain significance. This may be related to the current UTI. Electronically Signed   By: Inez Catalina M.D.   On: 11/06/2016 16:34     Assessment/Plan: ecoli uti with concern for pyelonephritis = - recommend to get non contrast pelvis CT/stone protocol to see if c/w nephroliathisis as possible source of recurrent uti - continue on cefepime, will see what  the susceptibility  Hx of cdifficile = only having 2 loose stools per day. Continue on prophylactic dosing for now. It does not appear to be active cdifficile infection  Baxter Flattery [facility] for Infectious Diseases Cell: [phone] Pager: [phone]  11/07/2016, 2:47 PM

## 2016-11-07 NOTE — Progress Notes (Signed)
PROGRESS NOTE    Bill Dunn  ALP:[id number] DOB: November 24, 1935 DOA: 11/05/2016 PCP: [name], MD      Brief Narrative:   Bill Dunn is a 81 year old male with a past medical history of Crohn disease bladder cancer who presents to the hospital with fevers chills / UTI symptoms.  Assessment & Plan:   1-complicated UTI 2/2 ecoli...improving  2-history of C. difficile colitis...improving . 3-history of Crohn disease...stable . 4-acute on chronic renal failure...improving . 5-history of coronary disease/hypertension/TIA/SVT...stable. 6-Hx of bladder cancer .  Urine culture is growing Escherichia coli pending final sensitivity  results , continue with IV cefepime for now hydrate with normal saline at 75 mg per hour start Flomax ID input is appreciated. US of the abdomen showed bladder wall thickening with enlarged prostate , further work up by Cystoscopy as outpt may be needed after resolving of this UTI .    DVT prophylaxis: (Lovenox) Code Status: DNR Family Communication: NONE. Disposition Plan:HOME.    Consultants:   ID  Procedures: (NONE )  Antimicrobials: (specify start and planned stop date. Auto populated tables are space occupying and do not give end dates)  Cefepime since 11/05/2016.  Oral vancomycin.   Subjective:  Bill Dunn continues to feel okay right upper quadrant pain is still there, denies any fevers or chills or nausea or vomiting stool is more firm.  Objective: Vitals:   11/06/16 0605 11/06/16 1642 11/06/16 2138 11/07/16 0538  BP: (!) 118/56 137/66 (!) 110/51 (!) 117/50  Pulse: 66 76 72 63  Resp: 18 20 18 18   Temp: 97.9 F (36.6 C) 99.2 F (37.3 C) 99.1 F (37.3 C) 98.6 F (37 C)  TempSrc: Oral Oral  Oral  SpO2: 93% 99% 93% 96%  Weight:      Height:        Intake/Output Summary (Last 24 hours) at 11/07/16 1338 Last data filed at 11/07/16 1211  Gross per 24 hour  Intake             1085 ml  Output              295 ml  Net               790 ml   Filed Weights   11/05/16 1136 11/05/16 1812  Weight: 110.7 kg (244 lb) 111.6 kg (246 lb)    Examination:  General exam: NAD. Respiratory system: Clear to auscultation bilaterally  Cardiovascular system: S1 & S2 heard, RRR. No JVD, murmurs, rubs, gallops or clicks. No pedal edema. Gastrointestinal system: Abdomen is nondistended, soft and nontender. No organomegaly or masses felt. Normal bowel sounds heard. Central nervous system: Alert and oriented. No focal neurological deficits. Extremities: Symmetric 5 x 5 power. Skin: No rashes, lesions or ulcers Psychiatry: Alert awake oriented 3.    Data Reviewed: I have personally reviewed following labs and imaging studies  CBC:  Recent Labs Lab 11/05/16 1211 11/06/16 0540 11/07/16 1014  WBC 12.0* 8.6 6.5  NEUTROABS 7.6  --   --   HGB 13.6 11.9* 11.6*  HCT 43.0 38.4* 36.4*  MCV 85.7 86.1 85.2  PLT 137* 113* 353*   Basic Metabolic Panel:  Recent Labs Lab 11/05/16 1211 11/06/16 0540 11/07/16 1014  NA 136 134* 135  K 4.3 3.8 3.6  CL 102 103 104  CO2 23 23 25   GLUCOSE 106* 121* 223*  BUN 32* 27* 19  CREATININE 2.06* 1.82* 1.76*  CALCIUM 8.7* 7.9* 7.7*   GFR: Estimated Creatinine Clearance:  41.9 mL/min (A) (by C-G formula based on SCr of 1.76 mg/dL (H)). Liver Function Tests:  Recent Labs Lab 11/05/16 1211 11/07/16 1014  AST 18 23  ALT 23 25  ALKPHOS 46 68  BILITOT 1.2 0.9  PROT 7.3 5.7*  ALBUMIN 3.8 2.7*   No results for input(s): LIPASE, AMYLASE in the last 168 hours. No results for input(s): AMMONIA in the last 168 hours. Coagulation Profile: No results for input(s): INR, PROTIME in the last 168 hours. Cardiac Enzymes: No results for input(s): CKTOTAL, CKMB, CKMBINDEX, TROPONINI in the last 168 hours. BNP (last 3 results) No results for input(s): PROBNP in the last 8760 hours. HbA1C: No results for input(s): HGBA1C in the last 72 hours. CBG: No results for input(s): GLUCAP in the  last 168 hours. Lipid Profile: No results for input(s): CHOL, HDL, LDLCALC, TRIG, CHOLHDL, LDLDIRECT in the last 72 hours. Thyroid Function Tests: No results for input(s): TSH, T4TOTAL, FREET4, T3FREE, THYROIDAB in the last 72 hours. Anemia Panel: No results for input(s): VITAMINB12, FOLATE, FERRITIN, TIBC, IRON, RETICCTPCT in the last 72 hours. Urine analysis:    Component Value Date/Time   COLORURINE AMBER (A) 11/05/2016 1529   APPEARANCEUR TURBID (A) 11/05/2016 1529   LABSPEC 1.013 11/05/2016 1529   PHURINE 5.0 11/05/2016 1529   GLUCOSEU NEGATIVE 11/05/2016 1529   HGBUR SMALL (A) 11/05/2016 1529   BILIRUBINUR NEGATIVE 11/05/2016 1529   KETONESUR NEGATIVE 11/05/2016 1529   PROTEINUR 30 (A) 11/05/2016 1529   NITRITE POSITIVE (A) 11/05/2016 1529   LEUKOCYTESUR LARGE (A) 11/05/2016 1529   Sepsis Labs: @LABRCNTIP (procalcitonin:4,lacticidven:4)  ) Recent Results (from the past 240 hour(s))  Culture, Urine     Status: Abnormal (Preliminary result)   Collection Time: 11/05/16  3:29 PM  Result Value Ref Range Status   Specimen Description URINE, CLEAN CATCH  Final   Special Requests NONE  Final   Culture (A)  Final    >=100,000 COLONIES/mL ESCHERICHIA COLI SUSCEPTIBILITIES TO FOLLOW    Report Status PENDING  Incomplete  Culture, blood (Routine X 2) w Reflex to ID Panel     Status: None (Preliminary result)   Collection Time: 11/05/16  4:40 PM  Result Value Ref Range Status   Specimen Description BLOOD RIGHT ANTECUBITAL  Final   Special Requests   Final    BOTTLES DRAWN AEROBIC AND ANAEROBIC Blood Culture adequate volume   Culture NO GROWTH 2 DAYS  Final   Report Status PENDING  Incomplete  Culture, blood (Routine X 2) w Reflex to ID Panel     Status: None (Preliminary result)   Collection Time: 11/05/16  6:56 PM  Result Value Ref Range Status   Specimen Description BLOOD RIGHT ANTECUBITAL  Final   Special Requests   Final    BOTTLES DRAWN AEROBIC AND ANAEROBIC Blood  Culture adequate volume   Culture NO GROWTH 2 DAYS  Final   Report Status PENDING  Incomplete         Radiology Studies: US Renal  Result Date: 11/06/2016 CLINICAL DATA:  Acute on chronic renal failure EXAM: RENAL / URINARY TRACT ULTRASOUND COMPLETE COMPARISON:  09/16/2016 FINDINGS: Right Kidney: Length: 13.1 cm. Cortical thinning is noted. Multiple cystic lesions are seen similar those noted on prior CT evaluation. The largest of these measures 5.2 cm in greatest dimension. Left Kidney: Length: 13.7 cm. Cortical thinning is noted. Multiple cysts are noted similar to that seen on prior CT. The largest of these measures 5.9 cm. Bladder: Well distended. Mild irregularity to  the bladder wall is noted of uncertain significance. IMPRESSION: Bilateral renal cysts. Diffuse cortical thinning consistent with the underlying chronic renal failure. Mild irregularity to the bladder wall of uncertain significance. This may be related to the current UTI. Electronically Signed   By: Inez Catalina M.D.   On: 11/06/2016 16:34        Scheduled Meds: . amLODipine  10 mg Oral Daily  . aspirin  325 mg Oral Daily  . atorvastatin  40 mg Oral QHS  . colestipol  1 g Oral BID  . heparin  5,000 Units Subcutaneous Q8H  . metoprolol succinate  200 mg Oral Daily  . montelukast  10 mg Oral QHS  . pantoprazole  40 mg Oral Daily  . saccharomyces boulardii  250 mg Oral BID  . senna-docusate  1 tablet Oral BID  . traZODone  200 mg Oral QHS  . vancomycin  125 mg Oral Q6H   Continuous Infusions: . ceFEPime (MAXIPIME) IV Stopped (11/06/16 1752)     LOS: 2 days    Time spent: 35 minutes.    Waldron Session, MD [facility] Pager [phone].  If 7PM-7AM, please contact night-coverage [email or web address] Password [email or web address] 11/07/2016, 1:38 PM

## 2016-11-07 NOTE — Progress Notes (Signed)
Occupational Therapy Treatment Patient Details Name: Lincon Sahlin MRN: [id number] DOB: [birth year] Today's Date: 11/07/2016    History of present illness Mr. Tapp is a 81 year old male with a past medical history of TIA, SVT, HTN, CKD III, AAA, spinal fusion, crohn disease bladder cancer who presents to the hospital with fevers chills / UTI symptoms   OT comments  This 81 yo male admitted with above presents to acute OT today not making progress towards goals due to increased pain and stating that he really does not feel good. He will continue to benefit from acute OT with follow up to be re-assessed next session.  Follow Up Recommendations  Supervision/Assistance - 24 hour;No OT follow up    Equipment Recommendations  None recommended by OT       Precautions / Restrictions Precautions Precautions: Fall Restrictions Weight Bearing Restrictions: No       Mobility Bed Mobility Overal bed mobility: Needs Assistance Bed Mobility: Rolling;Sidelying to Sit Rolling: Min guard Sidelying to sit: Mod assist;HOB elevated (use of rail. Pt reports he gets out of bed no the left and this is how I had him do it)          Transfers Overall transfer level: Needs assistance Equipment used: Rolling walker (2 wheeled) Transfers: Sit to/from Stand Sit to Stand: Min assist         General transfer comment: Once stood up and getting ready to walk--pt said he needed to sit down that he was in just too much pain    Balance Overall balance assessment: Needs assistance Sitting-balance support: No upper extremity supported;Feet supported Sitting balance-Leahy Scale: Good     Standing balance support: Bilateral upper extremity supported Standing balance-Leahy Scale: Poor Standing balance comment: needs min assist and support from RW in standing.                            ADL either performed or assessed with clinical judgement   ADL Overall ADL's : Needs assistance/impaired                          Toilet Transfer: Minimal assistance;Stand-pivot;RW Toilet Transfer Details (indicate cue type and reason): bed>recliner                  Vision Baseline Vision/History: Wears glasses Wears Glasses: Reading only Patient Visual Report: No change from baseline            Cognition Arousal/Alertness: Awake/alert Behavior During Therapy: WFL for tasks assessed/performed Overall Cognitive Status: Within Functional Limits for tasks assessed                                                     Pertinent Vitals/ Pain       Pain Assessment: 0-10 Pain Score: 8  Pain Location: all the way around his middle (but mainly at both kidneys) Pain Descriptors / Indicators: Sore;Aching Pain Intervention(s): Monitored during session;Repositioned         Frequency  Min 2X/week        Progress Toward Goals  OT Goals(current goals can now be found in the care plan section)  Progress towards OT goals: Not progressing toward goals - comment (increased pain today)     Plan Other (comment) (may need  to reconsider follow up--will reassess next session)       AM-PAC PT "6 Clicks" Daily Activity     Outcome Measure   Help from another person eating meals?: None Help from another person taking care of personal grooming?: None Help from another person toileting, which includes using toliet, bedpan, or urinal?: A Little Help from another person bathing (including washing, rinsing, drying)?: A Lot Help from another person to put on and taking off regular upper body clothing?: None Help from another person to put on and taking off regular lower body clothing?: A Lot 6 Click Score: 19    End of Session Equipment Utilized During Treatment: Gait belt;Rolling walker  OT Visit Diagnosis: Unsteadiness on feet (R26.81);Pain Pain - part of body:  (around middle to lower part of trunk)   Activity Tolerance Patient limited by pain    Patient Left in chair;with call bell/phone within reach;with chair alarm set           Time: 5329-9242 OT Time Calculation (min): 17 min  Charges: OT General Charges $OT Visit: 1 Procedure OT Treatments $Self Care/Home Management : 8-22 mins  [address], OTR/L [phone] 11/07/2016

## 2016-11-08 ENCOUNTER — Inpatient Hospital Stay (HOSPITAL_COMMUNITY): Payer: Medicare Other

## 2016-11-08 DIAGNOSIS — R1030 Lower abdominal pain, unspecified: Secondary | ICD-10-CM

## 2016-11-08 DIAGNOSIS — E781 Pure hyperglyceridemia: Secondary | ICD-10-CM

## 2016-11-08 DIAGNOSIS — Z8719 Personal history of other diseases of the digestive system: Secondary | ICD-10-CM

## 2016-11-08 DIAGNOSIS — K219 Gastro-esophageal reflux disease without esophagitis: Secondary | ICD-10-CM

## 2016-11-08 DIAGNOSIS — N390 Urinary tract infection, site not specified: Secondary | ICD-10-CM

## 2016-11-08 DIAGNOSIS — R319 Hematuria, unspecified: Secondary | ICD-10-CM

## 2016-11-08 DIAGNOSIS — B9620 Unspecified Escherichia coli [E. coli] as the cause of diseases classified elsewhere: Secondary | ICD-10-CM

## 2016-11-08 LAB — COMPREHENSIVE METABOLIC PANEL
ALK PHOS: 77 U/L (ref 38–126)
ALT: 27 U/L (ref 17–63)
ANION GAP: 7 (ref 5–15)
AST: 30 U/L (ref 15–41)
Albumin: 2.6 g/dL — ABNORMAL LOW (ref 3.5–5.0)
BUN: 13 mg/dL (ref 6–20)
CALCIUM: 8 mg/dL — AB (ref 8.9–10.3)
CO2: 23 mmol/L (ref 22–32)
Chloride: 106 mmol/L (ref 101–111)
Creatinine, Ser: 1.67 mg/dL — ABNORMAL HIGH (ref 0.61–1.24)
GFR, EST AFRICAN AMERICAN: 44 mL/min — AB (ref >60)
GFR, EST NON AFRICAN AMERICAN: 38 mL/min — AB (ref >60)
Glucose, Bld: 120 mg/dL — ABNORMAL HIGH (ref 65–99)
Potassium: 4.1 mmol/L (ref 3.5–5.1)
SODIUM: 136 mmol/L (ref 135–145)
Total Bilirubin: 0.8 mg/dL (ref 0.3–1.2)
Total Protein: 5.9 g/dL — ABNORMAL LOW (ref 6.5–8.1)

## 2016-11-08 LAB — CBC
HCT: 36.9 % — ABNORMAL LOW (ref 39.0–52.0)
Hemoglobin: 11.5 g/dL — ABNORMAL LOW (ref 13.0–17.0)
MCH: 26.6 pg (ref 26.0–34.0)
MCHC: 31.2 g/dL (ref 30.0–36.0)
MCV: 85.4 fL (ref 78.0–100.0)
PLATELETS: 135 10*3/uL — AB (ref 150–400)
RBC: 4.32 MIL/uL (ref 4.22–5.81)
RDW: 14.8 % (ref 11.5–15.5)
WBC: 6.6 10*3/uL (ref 4.0–10.5)

## 2016-11-08 LAB — URINE CULTURE: Culture: >=100000 — AB

## 2016-11-08 MED ORDER — ONDANSETRON HCL 4 MG/2ML IJ SOLN
4.0000 mg | Freq: Four times a day (QID) | INTRAMUSCULAR | Status: DC | PRN
  Administered 2016-11-08 – 2016-11-10 (×4): 4 mg via INTRAVENOUS
  Filled 2016-11-08 (×4): qty 2

## 2016-11-08 MED ORDER — HYDROMORPHONE HCL 1 MG/ML IJ SOLN
0.5000 mg | INTRAMUSCULAR | Status: DC | PRN
  Administered 2016-11-08 – 2016-11-09 (×4): 0.5 mg via INTRAVENOUS
  Filled 2016-11-08 (×4): qty 0.5

## 2016-11-08 MED ORDER — SODIUM CHLORIDE 0.9% FLUSH
10.0000 mL | INTRAVENOUS | Status: DC | PRN
  Administered 2016-11-10: 10 mL
  Filled 2016-11-08: qty 40

## 2016-11-08 MED ORDER — AMPICILLIN SODIUM 2 G IJ SOLR
2.0000 g | Freq: Three times a day (TID) | INTRAMUSCULAR | Status: DC
  Administered 2016-11-08 – 2016-11-10 (×6): 2 g via INTRAVENOUS
  Filled 2016-11-08 (×7): qty 2000

## 2016-11-08 MED ORDER — HYDROMORPHONE HCL 1 MG/ML IJ SOLN
0.5000 mg | Freq: Once | INTRAMUSCULAR | Status: AC
  Administered 2016-11-08: 0.5 mg via INTRAVENOUS
  Filled 2016-11-08: qty 0.5

## 2016-11-08 MED ORDER — OXYCODONE HCL 5 MG PO TABS
5.0000 mg | ORAL_TABLET | Freq: Once | ORAL | Status: AC | PRN
  Administered 2016-11-08: 5 mg via ORAL
  Filled 2016-11-08: qty 1

## 2016-11-08 MED ORDER — ONDANSETRON HCL 4 MG/2ML IJ SOLN: 4.0000 mg | Freq: Four times a day (QID) | INTRAMUSCULAR | Status: DC | PRN

## 2016-11-08 NOTE — Progress Notes (Signed)
[facility]    Date of Admission:  11/05/2016   Total days of antibiotics 4        Day 4 cefepime        Day 4 oral vanco           ID: Bill Dunn is a 81 y.o. male with fever, nightsweats, flank pain and dysuria Active Problems:   Crohn's disease ([place])   Hypertriglyceridemia   History of AAA (abdominal aortic aneurysm) repair   History of TIA (transient ischemic attack)   Gastroesophageal reflux disease without esophagitis   CKD (chronic kidney disease), stage III   C. difficile diarrhea   Dehydration   Fever   Abdominal pain    Subjective: This morning patient still reported having dysuria (which is not his baseline) though he reports that his flank pain is improved Only 2 loose BM yesterday  Medications:  . amLODipine  10 mg Oral Daily  . aspirin  325 mg Oral Daily  . atorvastatin  40 mg Oral QHS  . colestipol  1 g Oral BID  . heparin  5,000 Units Subcutaneous Q8H  . metoprolol succinate  200 mg Oral Daily  . montelukast  10 mg Oral QHS  . pantoprazole  40 mg Oral Daily  . saccharomyces boulardii  250 mg Oral BID  . senna-docusate  1 tablet Oral BID  . tamsulosin  0.4 mg Oral QPC supper  . traZODone  200 mg Oral QHS  . vancomycin  125 mg Oral Q6H    Objective: Vital signs in last 24 hours: Temp:  [98.4 F (36.9 C)-98.8 F (37.1 C)] 98.4 F (36.9 C) (07/08 0635) Pulse Rate:  [65-69] 65 (07/08 0635) Resp:  [18] 18 (07/08 0635) BP: (115-136)/(57-71) 136/71 (07/08 0635) SpO2:  [92 %-98 %] 92 % (07/08 1761) Physical Exam  Constitutional: He is oriented to person, place, and time. He appears well-developed and well-nourished. No distress.  HENT: hearing impaired Mouth/Throat: Oropharynx is clear and moist. No oropharyngeal exudate.  Cardiovascular: Normal rate, regular rhythm and normal heart sounds. Exam reveals no gallop and no friction rub.  No murmur heard.  Pulmonary/Chest: Effort normal and breath sounds normal. No  respiratory distress. He has no wheezes.  Abdominal: Soft. Protuberant abd + BS, non tender Psychiatric: He has a normal mood and affect. His behavior is normal.    Lab Results  Recent Labs  11/07/16 1014 11/08/16 0633  WBC 6.5 6.6  HGB 11.6* 11.5*  HCT 36.4* 36.9*  NA 135 136  K 3.6 4.1  CL 104 106  CO2 25 23  BUN 19 13  CREATININE 1.76* 1.67*   Liver Panel  Recent Labs  11/07/16 1014 11/08/16 0633  PROT 5.7* 5.9*  ALBUMIN 2.7* 2.6*  AST 23 30  ALT 25 27  ALKPHOS 68 77  BILITOT 0.9 0.8    Microbiology: 7/5 blood cx ngtd 7/5 urine cx ecoli- pan sensitive Studies/Results: US Renal  Result Date: 11/06/2016 CLINICAL DATA:  Acute on chronic renal failure EXAM: RENAL / URINARY TRACT ULTRASOUND COMPLETE COMPARISON:  09/16/2016 FINDINGS: Right Kidney: Length: 13.1 cm. Cortical thinning is noted. Multiple cystic lesions are seen similar those noted on prior CT evaluation. The largest of these measures 5.2 cm in greatest dimension. Left Kidney: Length: 13.7 cm. Cortical thinning is noted. Multiple cysts are noted similar to that seen on prior CT. The largest of these measures 5.9 cm. Bladder: Well distended. Mild irregularity to the bladder wall is  noted of uncertain significance. IMPRESSION: Bilateral renal cysts. Diffuse cortical thinning consistent with the underlying chronic renal failure. Mild irregularity to the bladder wall of uncertain significance. This may be related to the current UTI. Electronically Signed   By: Inez Catalina M.D.   On: 11/06/2016 16:34     Assessment/Plan: ecoli complicated uti with concern for pyelonephritis vs prostatitis= - recommend to get non contrast pelvis CT/stone protocol to see if c/w nephroliathisis vs prostatitis.  -  If the CT is negative, can treat with oral abtx  - with amox/clav 875 BID x 10 additional days ( this would be narrow abtx to minimize risk of c.difficile)  - If CT is positive for prostrate infection = would need to  extend abtx to 2-4 wk. Can still get adequate prostate tissue penetration with amox/clav. Would have him follow up with urology  Hx of cdifficile = only having 2 loose stools per day. Recommend to continue on prophylactic dosing of oral vancomycin while on his oral abtx to treat urinary tract infection  Baxter Flattery [facility] for Infectious Diseases Cell: [phone] Pager: [phone]  11/08/2016, 12:35 PM

## 2016-11-08 NOTE — Progress Notes (Signed)
PROGRESS NOTE    Elad Macphail  QQV:[id number] DOB: June 03, 1935 DOA: 11/05/2016 PCP: [name], MD      Brief Narrative:   Mr. Sandate is a 81 year old male with a past medical history of Crohn disease bladder cancer who presents to the hospital with fevers chills / UTI symptoms.  Assessment & Plan:   1-complicated UTI 2/2 ecoli...improving  2-history of C. difficile colitis...improved . 3-history of Crohn disease...stable . 4-acute on chronic renal failure...improving . 5-history of coronary disease/hypertension/TIA/SVT...stable. 6-Hx of bladder cancer .  Urine culture is growing Escherichia coli , pansus, continue with IV cefepime for today , switch to oral Augmentin tomorrow for 14 days of coverage . US of the abdomen showed bladder wall thickening with enlarged prostate , further work up by Cystoscopy as outpt may be needed after resolving of this UTI .    DVT prophylaxis: (Lovenox) Code Status: DNR Family Communication: NONE. Disposition Plan:HOME.    Consultants:   ID  Procedures: (NONE )  Antimicrobials: (specify start and planned stop date. Auto populated tables are space occupying and do not give end dates)  Cefepime since 11/05/2016.  Oral vancomycin.   Subjective:  Mr. Dastrup looks comfortable with continuous complaint of back pain , burning with urination is better , no fevers or chills.  Objective: Vitals:   11/07/16 1513 11/07/16 2119 11/08/16 0635 11/08/16 1521  BP: (!) 115/57 127/65 136/71 (!) 119/59  Pulse: 68 69 65 63  Resp: 18 18 18 17   Temp: 98.8 F (37.1 C) 98.4 F (36.9 C) 98.4 F (36.9 C) 98.5 F (36.9 C)  TempSrc: Oral   Oral  SpO2: 97% 98% 92% 94%  Weight:      Height:        Intake/Output Summary (Last 24 hours) at 11/08/16 1610 Last data filed at 11/08/16 1504  Gross per 24 hour  Intake          1779.16 ml  Output              600 ml  Net          1179.16 ml   Filed Weights   11/05/16 1136 11/05/16 1812    Weight: 110.7 kg (244 lb) 111.6 kg (246 lb)    Examination:  General exam: Looks comfortable with no distress . Respiratory system: CTAB. Cardiovascular system: S1 & S2 heard, RRR. No JVD, murmurs, rubs, gallops or clicks. No pedal edema. Gastrointestinal system: Abdomen is nondistended, soft and nontender. No organomegaly or masses felt. Normal bowel sounds heard. Central nervous system: Alert and oriented. No focal neurological deficits. Extremities: Symmetric 5 x 5 power. Skin: No rashes, lesions or ulcers Psychiatry: Alert awake oriented 3.    Data Reviewed: I have personally reviewed following labs and imaging studies  CBC:  Recent Labs Lab 11/05/16 1211 11/06/16 0540 11/07/16 1014 11/08/16 0633  WBC 12.0* 8.6 6.5 6.6  NEUTROABS 7.6  --   --   --   HGB 13.6 11.9* 11.6* 11.5*  HCT 43.0 38.4* 36.4* 36.9*  MCV 85.7 86.1 85.2 85.4  PLT 137* 113* 117* 332*   Basic Metabolic Panel:  Recent Labs Lab 11/05/16 1211 11/06/16 0540 11/07/16 1014 11/08/16 0633  NA 136 134* 135 136  K 4.3 3.8 3.6 4.1  CL 102 103 104 106  CO2 23 23 25 23   GLUCOSE 106* 121* 223* 120*  BUN 32* 27* 19 13  CREATININE 2.06* 1.82* 1.76* 1.67*  CALCIUM 8.7* 7.9* 7.7* 8.0*   GFR: Estimated  Creatinine Clearance: 44.2 mL/min (A) (by C-G formula based on SCr of 1.67 mg/dL (H)). Liver Function Tests:  Recent Labs Lab 11/05/16 1211 11/07/16 1014 11/08/16 0633  AST 18 23 30   ALT 23 25 27   ALKPHOS 46 68 77  BILITOT 1.2 0.9 0.8  PROT 7.3 5.7* 5.9*  ALBUMIN 3.8 2.7* 2.6*   No results for input(s): LIPASE, AMYLASE in the last 168 hours. No results for input(s): AMMONIA in the last 168 hours. Coagulation Profile: No results for input(s): INR, PROTIME in the last 168 hours. Cardiac Enzymes: No results for input(s): CKTOTAL, CKMB, CKMBINDEX, TROPONINI in the last 168 hours. BNP (last 3 results) No results for input(s): PROBNP in the last 8760 hours. HbA1C: No results for input(s):  HGBA1C in the last 72 hours. CBG: No results for input(s): GLUCAP in the last 168 hours. Lipid Profile: No results for input(s): CHOL, HDL, LDLCALC, TRIG, CHOLHDL, LDLDIRECT in the last 72 hours. Thyroid Function Tests: No results for input(s): TSH, T4TOTAL, FREET4, T3FREE, THYROIDAB in the last 72 hours. Anemia Panel: No results for input(s): VITAMINB12, FOLATE, FERRITIN, TIBC, IRON, RETICCTPCT in the last 72 hours. Urine analysis:    Component Value Date/Time   COLORURINE AMBER (A) 11/05/2016 1529   APPEARANCEUR TURBID (A) 11/05/2016 1529   LABSPEC 1.013 11/05/2016 1529   PHURINE 5.0 11/05/2016 1529   GLUCOSEU NEGATIVE 11/05/2016 1529   HGBUR SMALL (A) 11/05/2016 1529   BILIRUBINUR NEGATIVE 11/05/2016 1529   KETONESUR NEGATIVE 11/05/2016 1529   PROTEINUR 30 (A) 11/05/2016 1529   NITRITE POSITIVE (A) 11/05/2016 1529   LEUKOCYTESUR LARGE (A) 11/05/2016 1529   Sepsis Labs: @LABRCNTIP (procalcitonin:4,lacticidven:4)  ) Recent Results (from the past 240 hour(s))  Culture, Urine     Status: Abnormal   Collection Time: 11/05/16  3:29 PM  Result Value Ref Range Status   Specimen Description URINE, CLEAN CATCH  Final   Special Requests NONE  Final   Culture >=100,000 COLONIES/mL ESCHERICHIA COLI (A)  Final   Report Status 11/08/2016 FINAL  Final   Organism ID, Bacteria ESCHERICHIA COLI (A)  Final      Susceptibility   Escherichia coli - MIC*    AMPICILLIN <=2 SENSITIVE Sensitive     CEFAZOLIN <=4 SENSITIVE Sensitive     CEFTRIAXONE <=1 SENSITIVE Sensitive     CIPROFLOXACIN <=0.25 SENSITIVE Sensitive     GENTAMICIN <=1 SENSITIVE Sensitive     IMIPENEM <=0.25 SENSITIVE Sensitive     NITROFURANTOIN <=16 SENSITIVE Sensitive     TRIMETH/SULFA <=20 SENSITIVE Sensitive     AMPICILLIN/SULBACTAM <=2 SENSITIVE Sensitive     PIP/TAZO <=4 SENSITIVE Sensitive     Extended ESBL NEGATIVE Sensitive     * >=100,000 COLONIES/mL ESCHERICHIA COLI  Culture, blood (Routine X 2) w Reflex to ID  Panel     Status: None (Preliminary result)   Collection Time: 11/05/16  4:40 PM  Result Value Ref Range Status   Specimen Description BLOOD RIGHT ANTECUBITAL  Final   Special Requests   Final    BOTTLES DRAWN AEROBIC AND ANAEROBIC Blood Culture adequate volume   Culture NO GROWTH 3 DAYS  Final   Report Status PENDING  Incomplete  Culture, blood (Routine X 2) w Reflex to ID Panel     Status: None (Preliminary result)   Collection Time: 11/05/16  6:56 PM  Result Value Ref Range Status   Specimen Description BLOOD RIGHT ANTECUBITAL  Final   Special Requests   Final    BOTTLES DRAWN AEROBIC AND  ANAEROBIC Blood Culture adequate volume   Culture NO GROWTH 3 DAYS  Final   Report Status PENDING  Incomplete         Radiology Studies: US Renal  Result Date: 11/06/2016 CLINICAL DATA:  Acute on chronic renal failure EXAM: RENAL / URINARY TRACT ULTRASOUND COMPLETE COMPARISON:  09/16/2016 FINDINGS: Right Kidney: Length: 13.1 cm. Cortical thinning is noted. Multiple cystic lesions are seen similar those noted on prior CT evaluation. The largest of these measures 5.2 cm in greatest dimension. Left Kidney: Length: 13.7 cm. Cortical thinning is noted. Multiple cysts are noted similar to that seen on prior CT. The largest of these measures 5.9 cm. Bladder: Well distended. Mild irregularity to the bladder wall is noted of uncertain significance. IMPRESSION: Bilateral renal cysts. Diffuse cortical thinning consistent with the underlying chronic renal failure. Mild irregularity to the bladder wall of uncertain significance. This may be related to the current UTI. Electronically Signed   By: Inez Catalina M.D.   On: 11/06/2016 16:34   Ct Renal Stone Study  Result Date: 11/08/2016 CLINICAL DATA:  Flank pain and dysuria. Night sweats. History of Crohn's disease EXAM: CT ABDOMEN AND PELVIS WITHOUT CONTRAST TECHNIQUE: Multidetector CT imaging of the abdomen and pelvis was performed following the standard protocol  without oral or intravenous contrast material administration. COMPARISON:  CT abdomen and pelvis Sep 16, 2016; renal ultrasound November 06, 2016 FINDINGS: Lower chest: There is bibasilar scarring with mild posterior pleural thickening bilaterally. There are multiple foci of coronary artery calcification. Hepatobiliary: Liver measures 23.0 cm in length. No focal liver lesions are evident on this noncontrast enhanced study. Gallbladder is absent. There is no appreciable biliary duct dilatation. Pancreas: No pancreatic mass or inflammatory focus. Spleen: No splenic lesions are evident. There is a splenule anterior to the spleen, unchanged. Adrenals/Urinary Tract: Adrenals bilaterally appear unremarkable. There is no hydronephrosis on this side. There is a cyst arising from the lateral aspect of the mid right kidney measuring 4.1 x 4.6 cm. There is a cyst arising from the anterior mid right kidney measured 1.3 x 1.1 cm. On the left, there is a cyst arising from the posterior mid kidney measuring 4.9 x 4.2 cm. A cyst in the anterior mid kidney measures 3.7 by 2.8 cm. A cyst in the lateral mid kidney on the left measures 2.4 x 2.4 cm. There is again noted a mass arising from the lower pole of the left kidney laterally measuring 1.8 x 1.8 cm which has attenuation values higher than is expected with a simple cyst. There is no renal it'll calculus on either side. Urinary bladder is midline with wall thickening along the posterior aspect of the urinary bladder. There is also wall thickening along the leftward anterior and right lateral aspects of the urinary bladder. Stomach/Bowel: There has been removal of segments of bowel without evidence of bowel wall narrowing or thickening . No appreciable mesenteric thickening is seen. No bowel obstruction. No free air or portal venous air. Vascular/Lymphatic: There is a stent graft in the aorta again iliac artery. No aneurysm currently seen. There is aortic atherosclerosis. There is no  periaortic fluid. There is no appreciable adenopathy in the abdomen or pelvis. Reproductive: Prostate and seminal vesicles appear normal in size and contour. No pelvic mass evident. Other: Appendix absent. There is fat in each inguinal ring. There is no abscess or ascites in the abdomen or pelvis. Musculoskeletal: Patient is status post left total hip replacement. There is postoperative change in the proximal  right femur. There is degenerative change in the lumbar spine. There is mild sacroiliitis bilaterally. There is no appreciable intramuscular lesion. There is atrophy involving multiple pelvic muscles. IMPRESSION: 1. There are areas of irregular wall thickening at several sites in the urinary bladder. This finding may warrant cystoscopy to further assess. 2. There remains a mass along the lateral aspect of the lower pole left kidney which has attenuation values higher than expected. Further assessment warranted. Further evaluation with pre and post contrast MRI or CT should be considered. MRI is preferred in younger patients (due to lack of ionizing radiation) and for evaluating calcified lesion(s). There are several simple renal cysts bilaterally. 3.  No bowel obstruction.  No bowel wall thickening.  No fistula. 4.  No hydronephrosis.  No renal or ureteral calculus. 5. Aortic atherosclerosis. Stent graft in aorta. No aneurysm currently. There are multiple foci of coronary artery calcification. 6. There is a degree of sacroiliitis bilaterally consistent with known Crohn's disease. 7.  Prominent liver without focal liver lesion evident. 8.  Appendix and gallbladder absent. Aortic Atherosclerosis (ICD10-I70.0). Electronically Signed   By: Lowella Grip III M.D.   On: 11/08/2016 14:31        Scheduled Meds: . amLODipine  10 mg Oral Daily  . aspirin  325 mg Oral Daily  . atorvastatin  40 mg Oral QHS  . colestipol  1 g Oral BID  . heparin  5,000 Units Subcutaneous Q8H  . metoprolol succinate  200 mg  Oral Daily  . montelukast  10 mg Oral QHS  . pantoprazole  40 mg Oral Daily  . saccharomyces boulardii  250 mg Oral BID  . senna-docusate  1 tablet Oral BID  . tamsulosin  0.4 mg Oral QPC supper  . traZODone  200 mg Oral QHS  . vancomycin  125 mg Oral Q6H   Continuous Infusions: . sodium chloride 50 mL/hr at 11/08/16 1429  . ampicillin (OMNIPEN) IV 2 g (11/08/16 1446)     LOS: 3 days    Time spent: 35 minutes.    Waldron Session, MD [facility] Pager [phone].  If 7PM-7AM, please contact night-coverage [email or web address] Password [email or web address] 11/08/2016, 4:10 PM

## 2016-11-09 ENCOUNTER — Inpatient Hospital Stay (HOSPITAL_COMMUNITY): Payer: Medicare Other

## 2016-11-09 DIAGNOSIS — R55 Syncope and collapse: Secondary | ICD-10-CM

## 2016-11-09 DIAGNOSIS — Z8673 Personal history of transient ischemic attack (TIA), and cerebral infarction without residual deficits: Secondary | ICD-10-CM

## 2016-11-09 DIAGNOSIS — Z9889 Other specified postprocedural states: Secondary | ICD-10-CM

## 2016-11-09 LAB — COMPREHENSIVE METABOLIC PANEL
ALBUMIN: 2.5 g/dL — AB (ref 3.5–5.0)
ALT: 24 U/L (ref 17–63)
AST: 23 U/L (ref 15–41)
Alkaline Phosphatase: 68 U/L (ref 38–126)
Anion gap: 8 (ref 5–15)
BILIRUBIN TOTAL: 0.6 mg/dL (ref 0.3–1.2)
BUN: 10 mg/dL (ref 6–20)
CHLORIDE: 109 mmol/L (ref 101–111)
CO2: 23 mmol/L (ref 22–32)
Calcium: 8 mg/dL — ABNORMAL LOW (ref 8.9–10.3)
Creatinine, Ser: 1.56 mg/dL — ABNORMAL HIGH (ref 0.61–1.24)
GFR calc Af Amer: 47 mL/min — ABNORMAL LOW (ref >60)
GFR calc non Af Amer: 41 mL/min — ABNORMAL LOW (ref >60)
GLUCOSE: 119 mg/dL — AB (ref 65–99)
POTASSIUM: 4.3 mmol/L (ref 3.5–5.1)
SODIUM: 140 mmol/L (ref 135–145)
Total Protein: 5.7 g/dL — ABNORMAL LOW (ref 6.5–8.1)

## 2016-11-09 MED ORDER — AMOXICILLIN-POT CLAVULANATE 875-125 MG PO TABS: 1.0000 | ORAL_TABLET | Freq: Two times a day (BID) | ORAL | 0 refills | Status: DC

## 2016-11-09 MED ORDER — ONDANSETRON HCL 4 MG PO TABS: 4.0000 mg | ORAL_TABLET | Freq: Every day | ORAL | 1 refills | Status: AC | PRN

## 2016-11-09 MED ORDER — METOPROLOL SUCCINATE ER 100 MG PO TB24
100.0000 mg | ORAL_TABLET | Freq: Every day | ORAL | Status: DC
  Administered 2016-11-10: 100 mg via ORAL
  Filled 2016-11-09: qty 1

## 2016-11-09 NOTE — Progress Notes (Signed)
PT Cancellation Note  Patient Details Name: Azlaan Isidore MRN: [id number] DOB: [birth year]   Cancelled Treatment:    Reason Eval/Treat Not Completed: Patient declined, no reason specified Pt refusing stating he just got into bed and he's "not in the right mindset to work with PT." Will reattempt as schedule allows.   Leighton Ruff, PT, DPT  Acute Rehabilitation Services  Pager: [phone]    Rudean Hitt 11/09/2016, 11:29 AM

## 2016-11-09 NOTE — Care Management Note (Signed)
Case Management Note  Patient Details  Name: Bill Dunn MRN: [id number] Date of Birth: 1935-08-13  Subjective/Objective:          Admitted with acute cystitis.          Action/Plan: Plan is to d/c to home today.  Expected Discharge Date:  11/09/16               Expected Discharge Plan:  [name]  In-House Referral:     Discharge planning Services  CM Consult  Post Acute Care Choice:    Choice offered to:  Patient  DME Arranged:    DME Agency:     HH Arranged:  PT, OT, Nurse's Aide [place] Agency:  [place], referral made with [facility] / Butch Penny @ [phone]  Status of Service:  Completed, signed off  If discussed at [place] of Stay Meetings, dates discussed:    Additional Comments:  Sharin Mons, RN 11/09/2016, 11:22 AM

## 2016-11-09 NOTE — Discharge Summary (Addendum)
Physician Discharge Summary  Bill Dunn SAY:[id number] DOB: [birth year] DOA: 11/05/2016  PCP: Bill Geralds, MD  Admit date: 11/05/2016 Discharge date: 11/09/2016  Admitted From: (Home) Disposition:  (Home with home therapy ) Recommendations for Outpatient Follow-up:  1. Follow up with PCP in 1-2 weeks. 2. Please obtain BMP. 3. Please follow With your urologist as planned .  Home Health: (YES)  Equipment/Devices:NONE   Discharge Condition:(Stable).  CODE STATUS: (DNR).  Diet recommendation: Heart Healthy   Brief/Interim Summary:   Bill Dunn is a 81 year old male with a past medical history of Crohn disease / bladder cancer who presents to the hospital with fevers chills / UTI symptoms , right flank pain and dysuria, he was admitted,  Started initially on IV Cefepim, urine culture grew Escherichia coli pansusceptible. Workup by CAT scan of the abdomen and pelvis without contrast shows THICKENED wall of the bladder suggestive of cystitis along with multiple cysts bilaterally in the kidneys with mass in the left lower lobe of the left kidney recommended to follow-up with the contrast whenever his creat improves , His creatinine trended down nicely with hydration patient overall felt better , his chronic abdominal pain back pain related to chronic disease.  Our ID specialist recommended Augmentin for 10 days to be completed along the oral vancomycin to suppress any possibility of recurrent C. difficile, patient will need to follow-up with urology as an outpatient to repeat the CAT scan of the abdomen and pelvis with contrast, patient and his daughter were both explained about this plan and they are agreeable , home health will be provided to the patient   Discharge Diagnoses:     1- Complicated UTI / Cystitis 2/2 ecoli .   2- Hx of Crohn's disease ([place]) .   3-Hypertriglyceridemia .   4-History of AAA (abdominal aortic aneurysm) repair .   5-History of TIA (transient ischemic  attack) .   6-Gastroesophageal reflux disease without esophagitis .   7-AKI/CKD (chronic kidney disease), stage III : Improved .   8-resolving C. difficile diarrhea .   9-Vaso-vegal syncope : happened after having large bowel movement , responded to NS 1/2 L bolus and , CT head negative , no more symptoms , monitored for one more day before discharge .      Discharge Instructions  Discharge Instructions    Diet - low sodium heart healthy    Complete by:  As directed    Increase activity slowly    Complete by:  As directed      Allergies as of 11/09/2016      Reactions   Amoxil [amoxicillin] Other (See Comments)   REACTION: C-diff   Augmentin [amoxicillin-pot Clavulanate] Other (See Comments)   REACTION: C-Diff   Dilaudid [hydromorphone Hcl] Shortness Of Breath, Other (See Comments)   Stomach pain - reaction to injection of dilaudid and reglan together   Levaquin [levofloxacin] Other (See Comments)   Joint pain   Reglan [metoclopramide] Shortness Of Breath, Other (See Comments), Rash   Stomach pain - reaction to injection of dilaudid and reglan together Stomach pain - reaction to injection of dilaudid and reglan together   Ciprofloxacin Diarrhea, Other (See Comments)   Caused C-diff   Demerol [meperidine] Nausea And Vomiting   Infliximab Nausea Only   Reaction to Remicade/ flu like symptoms   Morphine And Related Nausea And Vomiting   Sulfamethoxazole-trimethoprim Other (See Comments)   Caused renal failure in combination with Prednisone   Vitamin D Analogs Rash   Ace Inhibitors Cough  Reaction to ramipril      Medication List    STOP taking these medications   hydrochlorothiazide 12.5 MG tablet Commonly known as:  HYDRODIURIL   omeprazole 20 MG capsule Commonly known as:  PRILOSEC     TAKE these medications   albuterol 108 (90 Base) MCG/ACT inhaler Commonly known as:  PROVENTIL HFA;VENTOLIN HFA Inhale 2 puffs into the lungs every 6 (six) hours as needed for  wheezing or shortness of breath.   amLODipine 10 MG tablet Commonly known as:  NORVASC Take 1 tablet (10 mg total) by mouth daily.   amoxicillin-clavulanate 875-125 MG tablet Commonly known as:  AUGMENTIN Take 1 tablet by mouth 2 (two) times daily.   atorvastatin 80 MG tablet Commonly known as:  LIPITOR Take 40 mg by mouth at bedtime.   chlorpheniramine 4 MG tablet Commonly known as:  CHLOR-TRIMETON Take 4 mg by mouth 2 (two) times daily.   COLESTID 1 g tablet Generic drug:  colestipol Take 1 g by mouth 2 (two) times daily.   diphenoxylate-atropine 2.5-0.025 MG tablet Commonly known as:  LOMOTIL Take 1 tablet by mouth 2 (two) times daily.   HYDROcodone-acetaminophen 5-325 MG tablet Commonly known as:  NORCO/VICODIN Take 1 tablet by mouth every 8 (eight) hours as needed for moderate pain or severe pain.   metoprolol 200 MG 24 hr tablet Commonly known as:  TOPROL-XL Take 200 mg by mouth at bedtime.   montelukast 10 MG tablet Commonly known as:  SINGULAIR Take 1 tablet (10 mg total) by mouth at bedtime.   nitroGLYCERIN 0.4 MG SL tablet Commonly known as:  NITROSTAT Place 1 tablet (0.4 mg total) under the tongue every 5 (five) minutes x 3 doses as needed for chest pain.   ondansetron 4 MG tablet Commonly known as:  ZOFRAN Take 1 tablet (4 mg total) by mouth daily as needed for nausea or vomiting.   saccharomyces boulardii 250 MG capsule Commonly known as:  FLORASTOR Take 1 capsule (250 mg total) by mouth 2 (two) times daily.   traZODone 100 MG tablet Commonly known as:  DESYREL Take 1 tablet (100 mg total) by mouth at bedtime. What changed:  how much to take   vancomycin 125 MG capsule Commonly known as:  VANCOCIN Take 1 capsule (125 mg total) by mouth 4 (four) times daily. Complete the course of antibiotics you received during prior hospital admission. What changed:  when to take this  additional instructions      Follow-up Information    Health,  [facility] Follow up.   Why:  home health services arranged , office will call and set up home visits Contact information: [place] [id number] [phone]          Allergies  Allergen Reactions  . Amoxil [Amoxicillin] Other (See Comments)    REACTION: C-diff  . Augmentin [Amoxicillin-Pot Clavulanate] Other (See Comments)    REACTION: C-Diff  . Dilaudid [Hydromorphone Hcl] Shortness Of Breath and Other (See Comments)    Stomach pain - reaction to injection of dilaudid and reglan together  . Levaquin [Levofloxacin] Other (See Comments)    Joint pain  . Reglan [Metoclopramide] Shortness Of Breath, Other (See Comments) and Rash    Stomach pain - reaction to injection of dilaudid and reglan together Stomach pain - reaction to injection of dilaudid and reglan together  . Ciprofloxacin Diarrhea and Other (See Comments)    Caused C-diff  . Demerol [Meperidine] Nausea And Vomiting  .  Infliximab Nausea Only    Reaction to Remicade/ flu like symptoms  . Morphine And Related Nausea And Vomiting  . Sulfamethoxazole-Trimethoprim Other (See Comments)    Caused renal failure in combination with Prednisone  . Vitamin D Analogs Rash  . Ace Inhibitors Cough    Reaction to ramipril    Consultations:  ID.   Procedures/Studies: US Renal  Result Date: 11/06/2016 CLINICAL DATA:  Acute on chronic renal failure EXAM: RENAL / URINARY TRACT ULTRASOUND COMPLETE COMPARISON:  09/16/2016 FINDINGS: Right Kidney: Length: 13.1 cm. Cortical thinning is noted. Multiple cystic lesions are seen similar those noted on prior CT evaluation. The largest of these measures 5.2 cm in greatest dimension. Left Kidney: Length: 13.7 cm. Cortical thinning is noted. Multiple cysts are noted similar to that seen on prior CT. The largest of these measures 5.9 cm. Bladder: Well distended. Mild irregularity to the bladder wall is noted of uncertain significance. IMPRESSION: Bilateral renal  cysts. Diffuse cortical thinning consistent with the underlying chronic renal failure. Mild irregularity to the bladder wall of uncertain significance. This may be related to the current UTI. Electronically Signed   By: Inez Catalina M.D.   On: 11/06/2016 16:34   Ct Renal Stone Study  Result Date: 11/08/2016 CLINICAL DATA:  Flank pain and dysuria. Night sweats. History of Crohn's disease EXAM: CT ABDOMEN AND PELVIS WITHOUT CONTRAST TECHNIQUE: Multidetector CT imaging of the abdomen and pelvis was performed following the standard protocol without oral or intravenous contrast material administration. COMPARISON:  CT abdomen and pelvis Sep 16, 2016; renal ultrasound November 06, 2016 FINDINGS: Lower chest: There is bibasilar scarring with mild posterior pleural thickening bilaterally. There are multiple foci of coronary artery calcification. Hepatobiliary: Liver measures 23.0 cm in length. No focal liver lesions are evident on this noncontrast enhanced study. Gallbladder is absent. There is no appreciable biliary duct dilatation. Pancreas: No pancreatic mass or inflammatory focus. Spleen: No splenic lesions are evident. There is a splenule anterior to the spleen, unchanged. Adrenals/Urinary Tract: Adrenals bilaterally appear unremarkable. There is no hydronephrosis on this side. There is a cyst arising from the lateral aspect of the mid right kidney measuring 4.1 x 4.6 cm. There is a cyst arising from the anterior mid right kidney measured 1.3 x 1.1 cm. On the left, there is a cyst arising from the posterior mid kidney measuring 4.9 x 4.2 cm. A cyst in the anterior mid kidney measures 3.7 by 2.8 cm. A cyst in the lateral mid kidney on the left measures 2.4 x 2.4 cm. There is again noted a mass arising from the lower pole of the left kidney laterally measuring 1.8 x 1.8 cm which has attenuation values higher than is expected with a simple cyst. There is no renal it'll calculus on either side. Urinary bladder is midline  with wall thickening along the posterior aspect of the urinary bladder. There is also wall thickening along the leftward anterior and right lateral aspects of the urinary bladder. Stomach/Bowel: There has been removal of segments of bowel without evidence of bowel wall narrowing or thickening . No appreciable mesenteric thickening is seen. No bowel obstruction. No free air or portal venous air. Vascular/Lymphatic: There is a stent graft in the aorta again iliac artery. No aneurysm currently seen. There is aortic atherosclerosis. There is no periaortic fluid. There is no appreciable adenopathy in the abdomen or pelvis. Reproductive: Prostate and seminal vesicles appear normal in size and contour. No pelvic mass evident. Other: Appendix absent. There is fat  in each inguinal ring. There is no abscess or ascites in the abdomen or pelvis. Musculoskeletal: Patient is status post left total hip replacement. There is postoperative change in the proximal right femur. There is degenerative change in the lumbar spine. There is mild sacroiliitis bilaterally. There is no appreciable intramuscular lesion. There is atrophy involving multiple pelvic muscles. IMPRESSION: 1. There are areas of irregular wall thickening at several sites in the urinary bladder. This finding may warrant cystoscopy to further assess. 2. There remains a mass along the lateral aspect of the lower pole left kidney which has attenuation values higher than expected. Further assessment warranted. Further evaluation with pre and post contrast MRI or CT should be considered. MRI is preferred in younger patients (due to lack of ionizing radiation) and for evaluating calcified lesion(s). There are several simple renal cysts bilaterally. 3.  No bowel obstruction.  No bowel wall thickening.  No fistula. 4.  No hydronephrosis.  No renal or ureteral calculus. 5. Aortic atherosclerosis. Stent graft in aorta. No aneurysm currently. There are multiple foci of coronary  artery calcification. 6. There is a degree of sacroiliitis bilaterally consistent with known Crohn's disease. 7.  Prominent liver without focal liver lesion evident. 8.  Appendix and gallbladder absent. Aortic Atherosclerosis (ICD10-I70.0). Electronically Signed   By: Lowella Grip III M.D.   On: 11/08/2016 14:31    (Echo, Carotid, EGD, Colonoscopy, ERCP)    Subjective:   Discharge Exam: Vitals:   11/09/16 0549 11/09/16 0921  BP: (!) 118/95 (!) 101/53  Pulse: 65 75  Resp: 18   Temp: 98.2 F (36.8 C)    Vitals:   11/08/16 1521 11/08/16 2149 11/09/16 0549 11/09/16 0921  BP: (!) 119/59 (!) 124/52 (!) 118/95 (!) 101/53  Pulse: 63 68 65 75  Resp: 17 18 18    Temp: 98.5 F (36.9 C) 99.2 F (37.3 C) 98.2 F (36.8 C)   TempSrc: Oral  Oral   SpO2: 94% 93% 95%   Weight:      Height:        General: Pt is alert, awake, not in acute distress Cardiovascular: RRR, S1/S2 +, no rubs, no gallops Respiratory: CTA bilaterally, no wheezing, no rhonchi Abdominal: Soft, NT, ND, bowel sounds + Extremities: no edema, no cyanosis    The results of significant diagnostics from this hospitalization (including imaging, microbiology, ancillary and laboratory) are listed below for reference.     Microbiology: Recent Results (from the past 240 hour(s))  Culture, Urine     Status: Abnormal   Collection Time: 11/05/16  3:29 PM  Result Value Ref Range Status   Specimen Description URINE, CLEAN CATCH  Final   Special Requests NONE  Final   Culture >=100,000 COLONIES/mL ESCHERICHIA COLI (A)  Final   Report Status 11/08/2016 FINAL  Final   Organism ID, Bacteria ESCHERICHIA COLI (A)  Final      Susceptibility   Escherichia coli - MIC*    AMPICILLIN <=2 SENSITIVE Sensitive     CEFAZOLIN <=4 SENSITIVE Sensitive     CEFTRIAXONE <=1 SENSITIVE Sensitive     CIPROFLOXACIN <=0.25 SENSITIVE Sensitive     GENTAMICIN <=1 SENSITIVE Sensitive     IMIPENEM <=0.25 SENSITIVE Sensitive     NITROFURANTOIN  <=16 SENSITIVE Sensitive     TRIMETH/SULFA <=20 SENSITIVE Sensitive     AMPICILLIN/SULBACTAM <=2 SENSITIVE Sensitive     PIP/TAZO <=4 SENSITIVE Sensitive     Extended ESBL NEGATIVE Sensitive     * >=100,000 COLONIES/mL ESCHERICHIA COLI  Culture, blood (Routine X 2) w Reflex to ID Panel     Status: None (Preliminary result)   Collection Time: 11/05/16  4:40 PM  Result Value Ref Range Status   Specimen Description BLOOD RIGHT ANTECUBITAL  Final   Special Requests   Final    BOTTLES DRAWN AEROBIC AND ANAEROBIC Blood Culture adequate volume   Culture NO GROWTH 4 DAYS  Final   Report Status PENDING  Incomplete  Culture, blood (Routine X 2) w Reflex to ID Panel     Status: None (Preliminary result)   Collection Time: 11/05/16  6:56 PM  Result Value Ref Range Status   Specimen Description BLOOD RIGHT ANTECUBITAL  Final   Special Requests   Final    BOTTLES DRAWN AEROBIC AND ANAEROBIC Blood Culture adequate volume   Culture NO GROWTH 4 DAYS  Final   Report Status PENDING  Incomplete     Labs: BNP (last 3 results)  Recent Labs  09/05/16 0039  BNP 39.0   Basic Metabolic Panel:  Recent Labs Lab 11/05/16 1211 11/06/16 0540 11/07/16 1014 11/08/16 0633  NA 136 134* 135 136  K 4.3 3.8 3.6 4.1  CL 102 103 104 106  CO2 23 23 25 23   GLUCOSE 106* 121* 223* 120*  BUN 32* 27* 19 13  CREATININE 2.06* 1.82* 1.76* 1.67*  CALCIUM 8.7* 7.9* 7.7* 8.0*   Liver Function Tests:  Recent Labs Lab 11/05/16 1211 11/07/16 1014 11/08/16 0633  AST 18 23 30   ALT 23 25 27   ALKPHOS 46 68 77  BILITOT 1.2 0.9 0.8  PROT 7.3 5.7* 5.9*  ALBUMIN 3.8 2.7* 2.6*   No results for input(s): LIPASE, AMYLASE in the last 168 hours. No results for input(s): AMMONIA in the last 168 hours. CBC:  Recent Labs Lab 11/05/16 1211 11/06/16 0540 11/07/16 1014 11/08/16 0633  WBC 12.0* 8.6 6.5 6.6  NEUTROABS 7.6  --   --   --   HGB 13.6 11.9* 11.6* 11.5*  HCT 43.0 38.4* 36.4* 36.9*  MCV 85.7 86.1 85.2  85.4  PLT 137* 113* 117* 135*   Cardiac Enzymes: No results for input(s): CKTOTAL, CKMB, CKMBINDEX, TROPONINI in the last 168 hours. BNP: Invalid input(s): POCBNP CBG: No results for input(s): GLUCAP in the last 168 hours. D-Dimer No results for input(s): DDIMER in the last 72 hours. Hgb A1c No results for input(s): HGBA1C in the last 72 hours. Lipid Profile No results for input(s): CHOL, HDL, LDLCALC, TRIG, CHOLHDL, LDLDIRECT in the last 72 hours. Thyroid function studies No results for input(s): TSH, T4TOTAL, T3FREE, THYROIDAB in the last 72 hours.  Invalid input(s): FREET3 Anemia work up No results for input(s): VITAMINB12, FOLATE, FERRITIN, TIBC, IRON, RETICCTPCT in the last 72 hours. Urinalysis    Component Value Date/Time   COLORURINE AMBER (A) 11/05/2016 1529   APPEARANCEUR TURBID (A) 11/05/2016 1529   LABSPEC 1.013 11/05/2016 1529   PHURINE 5.0 11/05/2016 1529   GLUCOSEU NEGATIVE 11/05/2016 1529   HGBUR SMALL (A) 11/05/2016 1529   BILIRUBINUR NEGATIVE 11/05/2016 1529   KETONESUR NEGATIVE 11/05/2016 1529   PROTEINUR 30 (A) 11/05/2016 1529   NITRITE POSITIVE (A) 11/05/2016 1529   LEUKOCYTESUR LARGE (A) 11/05/2016 1529   Sepsis Labs Invalid input(s): PROCALCITONIN,  WBC,  LACTICIDVEN Microbiology Recent Results (from the past 240 hour(s))  Culture, Urine     Status: Abnormal   Collection Time: 11/05/16  3:29 PM  Result Value Ref Range Status   Specimen Description URINE, CLEAN CATCH  Final  Special Requests NONE  Final   Culture >=100,000 COLONIES/mL ESCHERICHIA COLI (A)  Final   Report Status 11/08/2016 FINAL  Final   Organism ID, Bacteria ESCHERICHIA COLI (A)  Final      Susceptibility   Escherichia coli - MIC*    AMPICILLIN <=2 SENSITIVE Sensitive     CEFAZOLIN <=4 SENSITIVE Sensitive     CEFTRIAXONE <=1 SENSITIVE Sensitive     CIPROFLOXACIN <=0.25 SENSITIVE Sensitive     GENTAMICIN <=1 SENSITIVE Sensitive     IMIPENEM <=0.25 SENSITIVE Sensitive      NITROFURANTOIN <=16 SENSITIVE Sensitive     TRIMETH/SULFA <=20 SENSITIVE Sensitive     AMPICILLIN/SULBACTAM <=2 SENSITIVE Sensitive     PIP/TAZO <=4 SENSITIVE Sensitive     Extended ESBL NEGATIVE Sensitive     * >=100,000 COLONIES/mL ESCHERICHIA COLI  Culture, blood (Routine X 2) w Reflex to ID Panel     Status: None (Preliminary result)   Collection Time: 11/05/16  4:40 PM  Result Value Ref Range Status   Specimen Description BLOOD RIGHT ANTECUBITAL  Final   Special Requests   Final    BOTTLES DRAWN AEROBIC AND ANAEROBIC Blood Culture adequate volume   Culture NO GROWTH 4 DAYS  Final   Report Status PENDING  Incomplete  Culture, blood (Routine X 2) w Reflex to ID Panel     Status: None (Preliminary result)   Collection Time: 11/05/16  6:56 PM  Result Value Ref Range Status   Specimen Description BLOOD RIGHT ANTECUBITAL  Final   Special Requests   Final    BOTTLES DRAWN AEROBIC AND ANAEROBIC Blood Culture adequate volume   Culture NO GROWTH 4 DAYS  Final   Report Status PENDING  Incomplete     Time coordinating discharge: Over 30 minutes  SIGNED:   Waldron Session, MD  [facility] 11/09/2016, 11:45 AM Pager [phone] .  If 7PM-7AM, please contact night-coverage [email or web address] Password [email or web address]

## 2016-11-09 NOTE — Progress Notes (Signed)
PROGRESS NOTE    Bill Dunn  QIH:[id number] DOB: 11/08/1935 DOA: 11/05/2016 PCP: [name], MD      Brief Narrative:   Bill Dunn is a 81 year old male with a past medical history of Crohn disease bladder cancer who presents to the hospital with fevers chills / UTI symptoms.  Assessment & Plan:   1-Complicated UTI 2/2 ecoli...improving , switch to oral Abx at the time of the discharge . 2-history of C. difficile colitis...improved . 3-history of Crohn disease...stable . 4-Acute on chronic renal failure...improving . 5-history of coronary disease/hypertension/TIA/SVT...stable. 6-Hx of bladder cancer . 7-Vaso-vegal pre-syncope after bowel movement : will bolus him with NS, send for CBC/CMP/CT head , family wants him to be monitored one more day .  Urine culture is growing Escherichia coli , pansus, continue with IV cefepime, switch to oral Augmentin tomorrow for 14 days of coverage . US of the abdomen showed bladder wall thickening with enlarged prostate , further work up by Cystoscopy as outpt may be needed after resolving of this UTI .    DVT prophylaxis: (Lovenox) Code Status: DNR Family Communication: NONE. Disposition Plan:HOME.    Consultants:   ID  Procedures: (NONE )  Antimicrobials: (specify start and planned stop date. Auto populated tables are space occupying and do not give end dates)  Cefepime since 11/05/2016.  Oral vancomycin.   Subjective:  [name] was planned to be discharged today , he was all ready to go when he went to the bathroom to have bowel movement , all of sudden he became weak and pale after the BM , what looks like vasovegal episode , started IVF and sent for work up.  Objective: Vitals:   11/08/16 2149 11/09/16 0549 11/09/16 0921 11/09/16 1426  BP: (!) 124/52 (!) 118/95 (!) 101/53 139/61  Pulse: 68 65 75 71  Resp: 18 18    Temp: 99.2 F (37.3 C) 98.2 F (36.8 C)    TempSrc:  Oral    SpO2: 93% 95%  97%  Weight:       Height:        Intake/Output Summary (Last 24 hours) at 11/09/16 1459 Last data filed at 11/09/16 0805  Gross per 24 hour  Intake           389.16 ml  Output              252 ml  Net           137.16 ml   Filed Weights   11/05/16 1136 11/05/16 1812  Weight: 110.7 kg (244 lb) 111.6 kg (246 lb)    Examination:  General exam: Looks comfortable with no distress . Respiratory system: CTAB. Cardiovascular system: S1 & S2 heard, RRR. No JVD, murmurs, rubs, gallops or clicks. No pedal edema. Gastrointestinal system: Abdomen is nondistended, soft and nontender. No organomegaly or masses felt. Normal bowel sounds heard. Central nervous system: Alert and oriented. No focal neurological deficits. Extremities:no rash . Skin: No rashes, lesions or ulcers Psychiatry: Alert awake oriented 3.    Data Reviewed: I have personally reviewed following labs and imaging studies  CBC:  Recent Labs Lab 11/05/16 1211 11/06/16 0540 11/07/16 1014 11/08/16 0633  WBC 12.0* 8.6 6.5 6.6  NEUTROABS 7.6  --   --   --   HGB 13.6 11.9* 11.6* 11.5*  HCT 43.0 38.4* 36.4* 36.9*  MCV 85.7 86.1 85.2 85.4  PLT 137* 113* 117* 875*   Basic Metabolic Panel:  Recent Labs Lab 11/05/16 1211 11/06/16  0540 11/07/16 1014 11/08/16 0633  NA 136 134* 135 136  K 4.3 3.8 3.6 4.1  CL 102 103 104 106  CO2 23 23 25 23   GLUCOSE 106* 121* 223* 120*  BUN 32* 27* 19 13  CREATININE 2.06* 1.82* 1.76* 1.67*  CALCIUM 8.7* 7.9* 7.7* 8.0*   GFR: Estimated Creatinine Clearance: 44.2 mL/min (A) (by C-G formula based on SCr of 1.67 mg/dL (H)). Liver Function Tests:  Recent Labs Lab 11/05/16 1211 11/07/16 1014 11/08/16 0633  AST 18 23 30   ALT 23 25 27   ALKPHOS 46 68 77  BILITOT 1.2 0.9 0.8  PROT 7.3 5.7* 5.9*  ALBUMIN 3.8 2.7* 2.6*   No results for input(s): LIPASE, AMYLASE in the last 168 hours. No results for input(s): AMMONIA in the last 168 hours. Coagulation Profile: No results for input(s): INR,  PROTIME in the last 168 hours. Cardiac Enzymes: No results for input(s): CKTOTAL, CKMB, CKMBINDEX, TROPONINI in the last 168 hours. BNP (last 3 results) No results for input(s): PROBNP in the last 8760 hours. HbA1C: No results for input(s): HGBA1C in the last 72 hours. CBG: No results for input(s): GLUCAP in the last 168 hours. Lipid Profile: No results for input(s): CHOL, HDL, LDLCALC, TRIG, CHOLHDL, LDLDIRECT in the last 72 hours. Thyroid Function Tests: No results for input(s): TSH, T4TOTAL, FREET4, T3FREE, THYROIDAB in the last 72 hours. Anemia Panel: No results for input(s): VITAMINB12, FOLATE, FERRITIN, TIBC, IRON, RETICCTPCT in the last 72 hours. Urine analysis:    Component Value Date/Time   COLORURINE AMBER (A) 11/05/2016 1529   APPEARANCEUR TURBID (A) 11/05/2016 1529   LABSPEC 1.013 11/05/2016 1529   PHURINE 5.0 11/05/2016 1529   GLUCOSEU NEGATIVE 11/05/2016 1529   HGBUR SMALL (A) 11/05/2016 1529   BILIRUBINUR NEGATIVE 11/05/2016 1529   KETONESUR NEGATIVE 11/05/2016 1529   PROTEINUR 30 (A) 11/05/2016 1529   NITRITE POSITIVE (A) 11/05/2016 1529   LEUKOCYTESUR LARGE (A) 11/05/2016 1529   Sepsis Labs: @LABRCNTIP (procalcitonin:4,lacticidven:4)  ) Recent Results (from the past 240 hour(s))  Culture, Urine     Status: Abnormal   Collection Time: 11/05/16  3:29 PM  Result Value Ref Range Status   Specimen Description URINE, CLEAN CATCH  Final   Special Requests NONE  Final   Culture >=100,000 COLONIES/mL ESCHERICHIA COLI (A)  Final   Report Status 11/08/2016 FINAL  Final   Organism ID, Bacteria ESCHERICHIA COLI (A)  Final      Susceptibility   Escherichia coli - MIC*    AMPICILLIN <=2 SENSITIVE Sensitive     CEFAZOLIN <=4 SENSITIVE Sensitive     CEFTRIAXONE <=1 SENSITIVE Sensitive     CIPROFLOXACIN <=0.25 SENSITIVE Sensitive     GENTAMICIN <=1 SENSITIVE Sensitive     IMIPENEM <=0.25 SENSITIVE Sensitive     NITROFURANTOIN <=16 SENSITIVE Sensitive      TRIMETH/SULFA <=20 SENSITIVE Sensitive     AMPICILLIN/SULBACTAM <=2 SENSITIVE Sensitive     PIP/TAZO <=4 SENSITIVE Sensitive     Extended ESBL NEGATIVE Sensitive     * >=100,000 COLONIES/mL ESCHERICHIA COLI  Culture, blood (Routine X 2) w Reflex to ID Panel     Status: None (Preliminary result)   Collection Time: 11/05/16  4:40 PM  Result Value Ref Range Status   Specimen Description BLOOD RIGHT ANTECUBITAL  Final   Special Requests   Final    BOTTLES DRAWN AEROBIC AND ANAEROBIC Blood Culture adequate volume   Culture NO GROWTH 4 DAYS  Final   Report Status PENDING  Incomplete  Culture, blood (Routine X 2) w Reflex to ID Panel     Status: None (Preliminary result)   Collection Time: 11/05/16  6:56 PM  Result Value Ref Range Status   Specimen Description BLOOD RIGHT ANTECUBITAL  Final   Special Requests   Final    BOTTLES DRAWN AEROBIC AND ANAEROBIC Blood Culture adequate volume   Culture NO GROWTH 4 DAYS  Final   Report Status PENDING  Incomplete         Radiology Studies: Ct Renal Stone Study  Result Date: 11/08/2016 CLINICAL DATA:  Flank pain and dysuria. Night sweats. History of Crohn's disease EXAM: CT ABDOMEN AND PELVIS WITHOUT CONTRAST TECHNIQUE: Multidetector CT imaging of the abdomen and pelvis was performed following the standard protocol without oral or intravenous contrast material administration. COMPARISON:  CT abdomen and pelvis Sep 16, 2016; renal ultrasound November 06, 2016 FINDINGS: Lower chest: There is bibasilar scarring with mild posterior pleural thickening bilaterally. There are multiple foci of coronary artery calcification. Hepatobiliary: Liver measures 23.0 cm in length. No focal liver lesions are evident on this noncontrast enhanced study. Gallbladder is absent. There is no appreciable biliary duct dilatation. Pancreas: No pancreatic mass or inflammatory focus. Spleen: No splenic lesions are evident. There is a splenule anterior to the spleen, unchanged.  Adrenals/Urinary Tract: Adrenals bilaterally appear unremarkable. There is no hydronephrosis on this side. There is a cyst arising from the lateral aspect of the mid right kidney measuring 4.1 x 4.6 cm. There is a cyst arising from the anterior mid right kidney measured 1.3 x 1.1 cm. On the left, there is a cyst arising from the posterior mid kidney measuring 4.9 x 4.2 cm. A cyst in the anterior mid kidney measures 3.7 by 2.8 cm. A cyst in the lateral mid kidney on the left measures 2.4 x 2.4 cm. There is again noted a mass arising from the lower pole of the left kidney laterally measuring 1.8 x 1.8 cm which has attenuation values higher than is expected with a simple cyst. There is no renal it'll calculus on either side. Urinary bladder is midline with wall thickening along the posterior aspect of the urinary bladder. There is also wall thickening along the leftward anterior and right lateral aspects of the urinary bladder. Stomach/Bowel: There has been removal of segments of bowel without evidence of bowel wall narrowing or thickening . No appreciable mesenteric thickening is seen. No bowel obstruction. No free air or portal venous air. Vascular/Lymphatic: There is a stent graft in the aorta again iliac artery. No aneurysm currently seen. There is aortic atherosclerosis. There is no periaortic fluid. There is no appreciable adenopathy in the abdomen or pelvis. Reproductive: Prostate and seminal vesicles appear normal in size and contour. No pelvic mass evident. Other: Appendix absent. There is fat in each inguinal ring. There is no abscess or ascites in the abdomen or pelvis. Musculoskeletal: Patient is status post left total hip replacement. There is postoperative change in the proximal right femur. There is degenerative change in the lumbar spine. There is mild sacroiliitis bilaterally. There is no appreciable intramuscular lesion. There is atrophy involving multiple pelvic muscles. IMPRESSION: 1. There are  areas of irregular wall thickening at several sites in the urinary bladder. This finding may warrant cystoscopy to further assess. 2. There remains a mass along the lateral aspect of the lower pole left kidney which has attenuation values higher than expected. Further assessment warranted. Further evaluation with pre and post contrast MRI or CT  should be considered. MRI is preferred in younger patients (due to lack of ionizing radiation) and for evaluating calcified lesion(s). There are several simple renal cysts bilaterally. 3.  No bowel obstruction.  No bowel wall thickening.  No fistula. 4.  No hydronephrosis.  No renal or ureteral calculus. 5. Aortic atherosclerosis. Stent graft in aorta. No aneurysm currently. There are multiple foci of coronary artery calcification. 6. There is a degree of sacroiliitis bilaterally consistent with known Crohn's disease. 7.  Prominent liver without focal liver lesion evident. 8.  Appendix and gallbladder absent. Aortic Atherosclerosis (ICD10-I70.0). Electronically Signed   By: Lowella Grip III M.D.   On: 11/08/2016 14:31        Scheduled Meds: . amLODipine  10 mg Oral Daily  . aspirin  325 mg Oral Daily  . atorvastatin  40 mg Oral QHS  . colestipol  1 g Oral BID  . heparin  5,000 Units Subcutaneous Q8H  . metoprolol succinate  200 mg Oral Daily  . montelukast  10 mg Oral QHS  . pantoprazole  40 mg Oral Daily  . saccharomyces boulardii  250 mg Oral BID  . senna-docusate  1 tablet Oral BID  . tamsulosin  0.4 mg Oral QPC supper  . traZODone  200 mg Oral QHS  . vancomycin  125 mg Oral Q6H   Continuous Infusions: . sodium chloride 50 mL/hr at 11/08/16 1429  . ampicillin (OMNIPEN) IV 2 g (11/09/16 1252)     LOS: 4 days    Time spent: 35 minutes.    Waldron Session, MD [facility] Pager [phone].  If 7PM-7AM, please contact night-coverage [email or web address] Password [email or web address] 11/09/2016, 2:59 PM

## 2016-11-09 NOTE — Progress Notes (Signed)
OT Cancellation Note  Patient Details Name: Bill Dunn MRN: [id number] DOB: June 21, 1935   Cancelled Treatment:    Reason Eval/Treat Not Completed: Patient declined, no reason specified; Pt laying in bed reporting he is having an "off day", daughter present in room. Pt requesting to rest at this time. Will check back as schedule permits.  Lou Cal, OT Pager [phone] 11/09/2016   Bill Dunn 11/09/2016, 11:43 AM

## 2016-11-10 LAB — CBC
HEMATOCRIT: 35.5 % — AB (ref 39.0–52.0)
HEMOGLOBIN: 11 g/dL — AB (ref 13.0–17.0)
MCH: 26.7 pg (ref 26.0–34.0)
MCHC: 31 g/dL (ref 30.0–36.0)
MCV: 86.2 fL (ref 78.0–100.0)
Platelets: 145 10*3/uL — ABNORMAL LOW (ref 150–400)
RBC: 4.12 MIL/uL — ABNORMAL LOW (ref 4.22–5.81)
RDW: 15.2 % (ref 11.5–15.5)
WBC: 5.5 10*3/uL (ref 4.0–10.5)

## 2016-11-10 LAB — CULTURE, BLOOD (ROUTINE X 2)
CULTURE: NO GROWTH
Culture: NO GROWTH
SPECIAL REQUESTS: ADEQUATE
Special Requests: ADEQUATE

## 2016-11-10 MED ORDER — AMOXICILLIN-POT CLAVULANATE 875-125 MG PO TABS: 1.0000 | ORAL_TABLET | Freq: Two times a day (BID) | ORAL | 0 refills | Status: AC

## 2016-11-10 MED ORDER — HEPARIN SOD (PORK) LOCK FLUSH 100 UNIT/ML IV SOLN
500.0000 [IU] | INTRAVENOUS | Status: AC | PRN
  Administered 2016-11-10: 500 [IU]

## 2016-11-10 NOTE — Progress Notes (Signed)
Patient discharged to home with wife this morning 11/10/16. Patient will follow up with [facility] after discharge. Discharge teaching provided to patient and wife who verbalized understanding. Porta-cath removed by IV team. Skin intact and denies pain or discomfort.   Markus Jarvis, LPN 6/60/60

## 2016-11-10 NOTE — Progress Notes (Signed)
Occupational Therapy Treatment Patient Details Name: Bill Dunn MRN: [id number] DOB: 06/08/1935 Today's Date: 11/10/2016    History of present illness Bill Dunn is a 81 year old male with a past medical history of TIA, SVT, HTN, CKD III, AAA, spinal fusion, crohn disease bladder cancer who presents to the hospital with fevers chills / UTI symptoms   OT comments  Pt progressing toward OT goals and reports decrease in pain since previous day. He was able to complete ambulating toilet transfers with min guard assist and RW. Pt required mod assist from his family for LB dressing tasks but was able to pull pants over hips once standing. He reports that this is approaching his baseline. Pt continues to demonstrate decreased activity tolerance for ADL and would benefit from continued OT follow-up via home health OT services and updated D/C recommendation appropriately. Note plan for D/C home with family this date.    Follow Up Recommendations  Supervision/Assistance - 24 hour;Home health OT    Equipment Recommendations  None recommended by OT    Recommendations for Other Services      Precautions / Restrictions Precautions Precautions: Fall Restrictions Weight Bearing Restrictions: No       Mobility Bed Mobility               General bed mobility comments: OOB in chair on OT arrival.   Transfers Overall transfer level: Needs assistance Equipment used: Rolling walker (2 wheeled) Transfers: Sit to/from Stand Sit to Stand: Min guard         General transfer comment: Min guard assist for safety. Pt moving slowly.     Balance Overall balance assessment: Needs assistance Sitting-balance support: No upper extremity supported;Feet supported Sitting balance-Leahy Scale: Good     Standing balance support: Bilateral upper extremity supported Standing balance-Leahy Scale: Fair Standing balance comment: Able to statically stand without UE support with min guard assist from  therapist.                            ADL either performed or assessed with clinical judgement   ADL Overall ADL's : Needs assistance/impaired                 Upper Body Dressing : Set up;Sitting   Lower Body Dressing: Moderate assistance;Sit to/from stand Lower Body Dressing Details (indicate cue type and reason): Mod assist for threading feet into pants. Once standing able to pull up.  Toilet Transfer: Min guard;Ambulation;RW           Functional mobility during ADLs: Rolling walker;Min guard General ADL Comments: Pt's family reports that he requires significant assistance at baseline.      Vision       Perception     Praxis      Cognition Arousal/Alertness: Awake/alert Behavior During Therapy: WFL for tasks assessed/performed Overall Cognitive Status: Within Functional Limits for tasks assessed Area of Impairment: Memory                     Memory: Decreased short-term memory         General Comments: Pt reporting frustration due to still being in the hospital.        Exercises     Shoulder Instructions       General Comments      Pertinent Vitals/ Pain       Pain Assessment: Faces Faces Pain Scale: Hurts little more Pain Location: all the way around his  middle (but mainly at both kidneys) Pain Descriptors / Indicators: Sore;Aching Pain Intervention(s): Limited activity within patient's tolerance;Repositioned;Monitored during session  Home Living                                          Prior Functioning/Environment              Frequency  Min 2X/week        Progress Toward Goals  OT Goals(current goals can now be found in the care plan section)  Progress towards OT goals: Progressing toward goals  Acute Rehab OT Goals Patient Stated Goal: to get home, get well and get his head straight OT Goal Formulation: With patient/family Time For Goal Achievement: 11/13/16 Potential to Achieve  Goals: Good ADL Goals Pt Will Perform Grooming: with min guard assist;standing Pt Will Transfer to Toilet: with min guard assist;bedside commode;ambulating;regular height toilet;grab bars Pt Will Perform Toileting - Clothing Manipulation and hygiene: with min assist;with min guard assist;sit to/from stand Additional ADL Goal #1: pt will participate in B UE HEP with level 2 theraband  Plan Discharge plan needs to be updated    Co-evaluation                 AM-PAC PT "6 Clicks" Daily Activity     Outcome Measure   Help from another person eating meals?: None Help from another person taking care of personal grooming?: A Little Help from another person toileting, which includes using toliet, bedpan, or urinal?: A Little Help from another person bathing (including washing, rinsing, drying)?: A Lot Help from another person to put on and taking off regular upper body clothing?: None Help from another person to put on and taking off regular lower body clothing?: A Lot 6 Click Score: 18    End of Session Equipment Utilized During Treatment: Gait belt;Rolling walker  OT Visit Diagnosis: Unsteadiness on feet (R26.81);Pain Pain - part of body:  (abdomen and lower trunk)   Activity Tolerance Patient limited by pain   Patient Left in chair;with call bell/phone within reach;with chair alarm set   Nurse Communication          Time: 6629-4765 OT Time Calculation (min): 22 min  Charges: OT General Charges $OT Visit: 1 Procedure OT Treatments $Self Care/Home Management : 8-22 mins  Bill Herrlich, MS OTR/L  Pager: Bill Dunn 11/10/2016, 1:39 PM

## 2018-06-18 IMAGING — CT CT ABD-PELV W/O CM
2 of 4 series · 15 of 46 positions shown, 17 images · non-contrast
Comparison: None.

CLINICAL DATA: Crohn disease with abdominal pain for 4 days.
Abdominal distention and vomiting since yesterday.

EXAM:
CT ABDOMEN AND PELVIS WITHOUT CONTRAST
TECHNIQUE: Multidetector CT imaging of the abdomen and pelvis was performed
following the standard protocol without IV contrast.

[Series 3: abd/ pelvis 5.0 i30f 2 · axial · 0.98mm/px · z∈[-484,-64]mm · 12 of 96 slices shown, 14 images]
[im 8/96  soft-tissue]
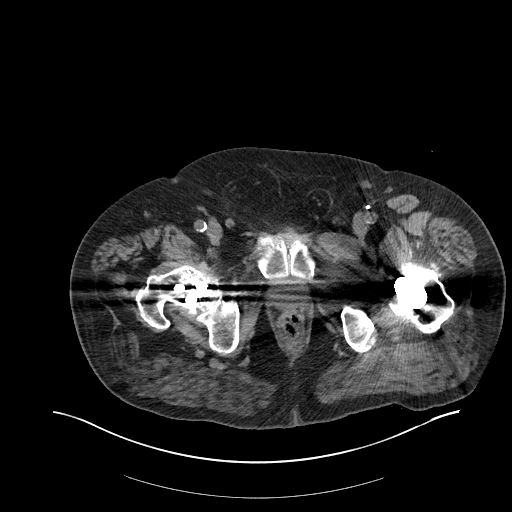
[im 8/96  bone]
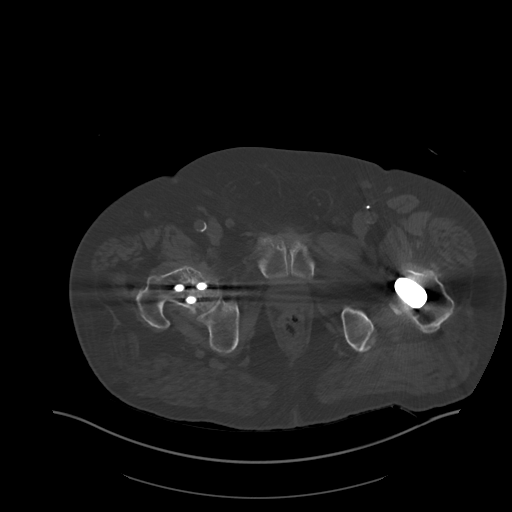
[im 16/96  soft-tissue]
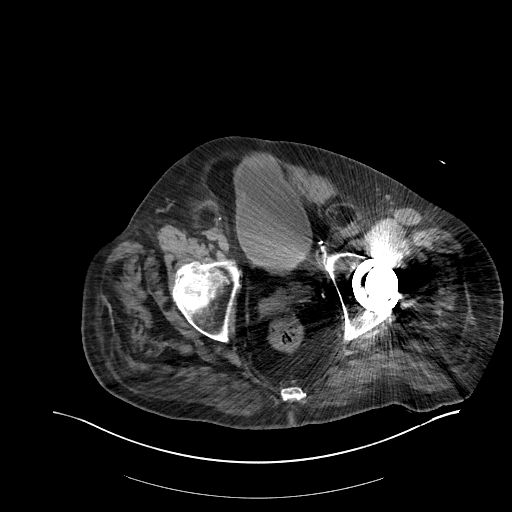
[im 23/96  soft-tissue]
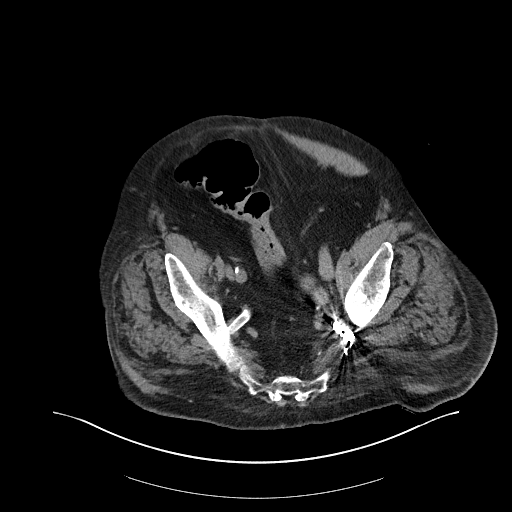
[im 31/96  soft-tissue]
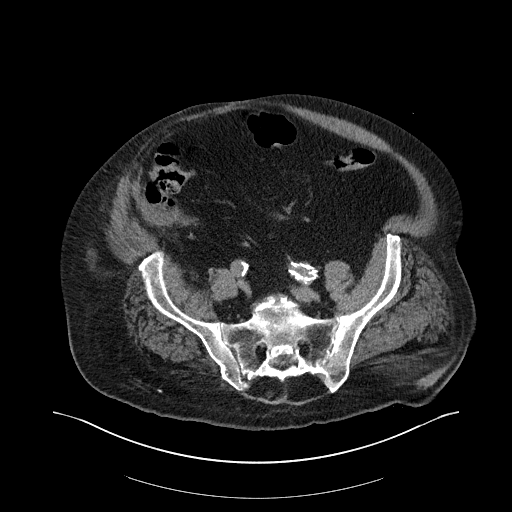
[im 39/96  soft-tissue]
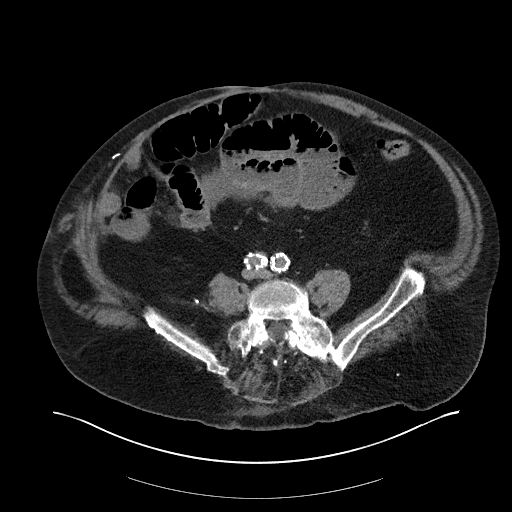
[im 46/96  soft-tissue]
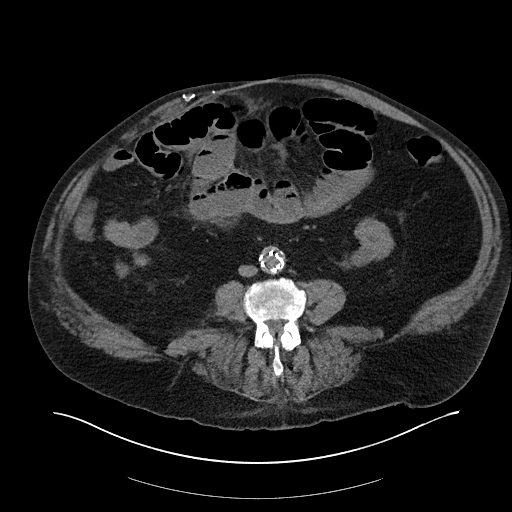
[im 54/96  soft-tissue]
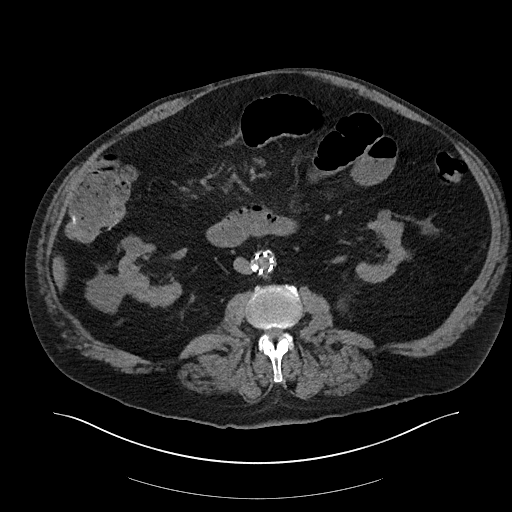
[im 61/96  soft-tissue]
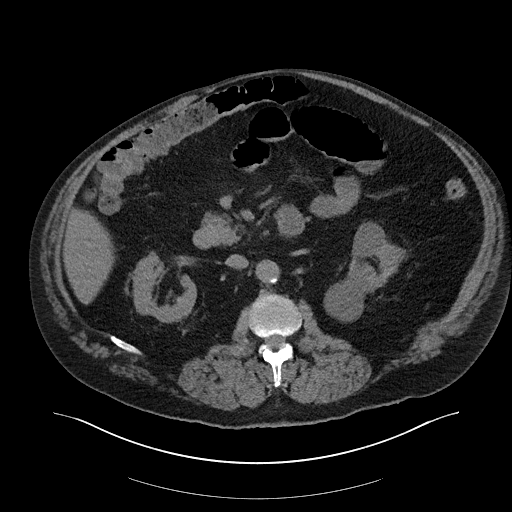
[im 69/96  soft-tissue]
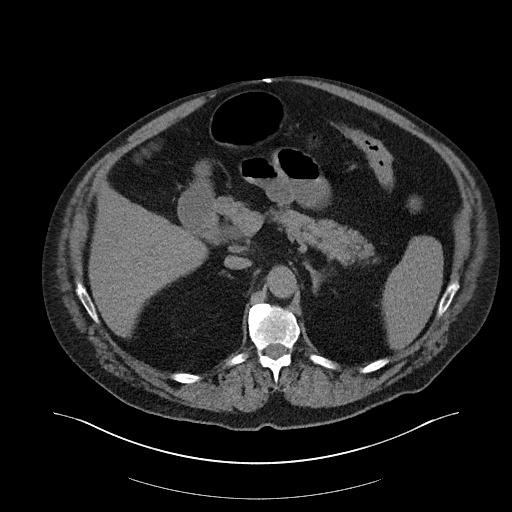
[im 69/96  bone]
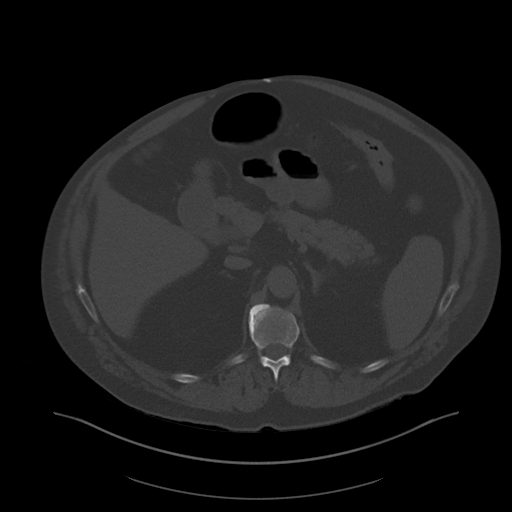
[im 77/96  soft-tissue]
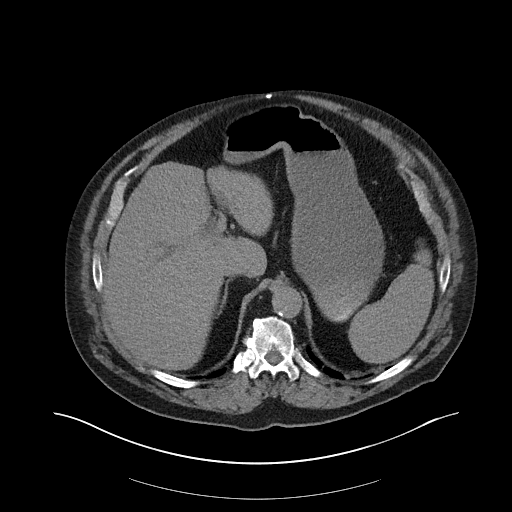
[im 84/96  soft-tissue]
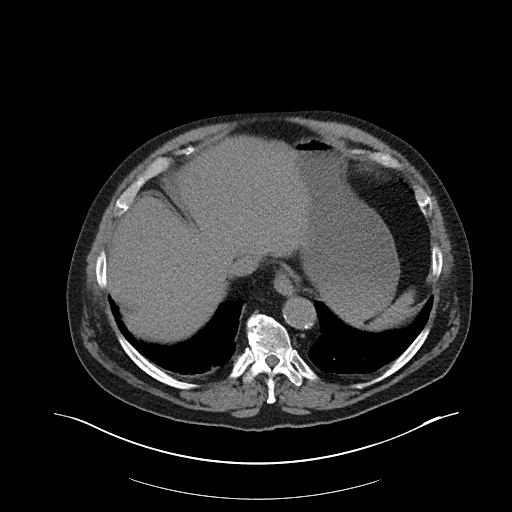
[im 92/96  soft-tissue]
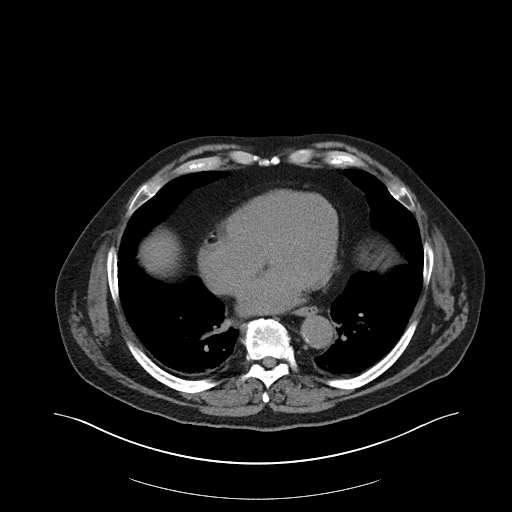

[Series 6: cor st · coronal · 0.97mm/px · 3 of 120 slices shown]
[im 40/120  soft-tissue]
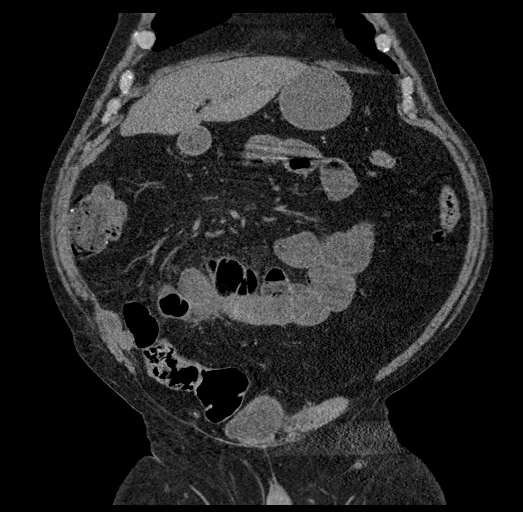
[im 53/120  soft-tissue]
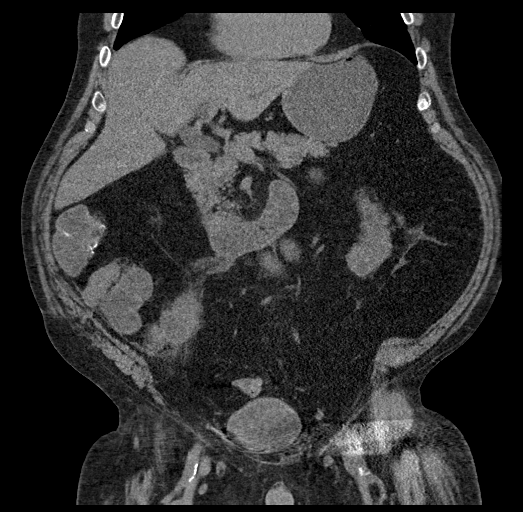
[im 67/120  soft-tissue]
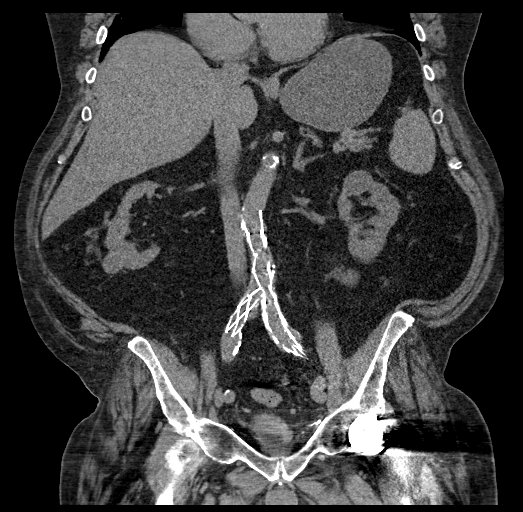

[15 of 46 positions shown; findings below may reference images not displayed]

FINDINGS: Lower chest: The visualized cardiac chambers are mildly enlarged. No
pericardial effusion. Bibasilar dependent atelectasis is identified.
There is no pneumothorax. Minimal scarring along the periphery of
the right lower lobe.

Hepatobiliary: Cholecystectomy with reservoir effect likely
accounting for the mild intrahepatic ductal dilatation. No
space-occupying mass of the unenhanced liver is noted.

Pancreas: Normal

Spleen: Normal in size without mass.  Small adjacent splenule.

Adrenals/Urinary Tract: The adrenal glands are normal bilaterally.
There are circumscribed simple and complex cysts noted off the
kidneys bilaterally some of which are hyperdense and may represent
hemorrhagic or proteinaceous cysts, example series 3, image 44 off
the interpolar left kidney measuring 1.6 cm. Others such as a 4.5 cm
right lower pole cyst appear to containing milk-of-calcium along the
dependent aspect. No nephrolithiasis or hydronephrosis. Further
characterization is not possible given lack of IV contrast.

Stomach/Bowel: The stomach is mildly distended with fluid. There is
mild-to-moderate dilatation of small intestine starting from the
third portion of the duodenum through much of the jejunum with
fluid-filled dilated small bowel loops measuring up to 2.9 cm in
caliber. There appears be a transition point in the right lower
quadrant for which a short segment stricture is not excluded
contributing to early changes of small bowel obstruction. Beyond
this, the small bowel appears decompressed leading up to an
enterocolic anastomosis in the right lower quadrant. No significant
mural or fold thickening to suggest acute inflammatory bowel or
definite tethering to suggest an adhesion.

Vascular/Lymphatic: Aortic and branch vessel atherosclerosis is
identified at the origins of the celiac, SMA and both renal
arteries. An infrarenal aortic stent graft is identified extending
into both common iliac arteries with additional separate right
internal iliac artery stent noted secondary to a 2.2 cm aneurysm
along the distal internal iliac artery. Aneurysmal dilatation the
left internal iliac artery up to 3.5 cm is identified with
embolization coils further distal. No lymphadenopathy by CT size
criteria is identified.

Reproductive: Prostate is unremarkable.

Other: No abdominal wall hernia or abnormality. No abdominopelvic
ascites.

Musculoskeletal: Atrophy of the right rectus muscle with overlying
skin staples. Cannulated screw fixation of the right femoral neck
with left hip arthroplasty. No acute nor suspicious osseous lesions.
IMPRESSION: 1. Dilated to duodenal and jejunal loops up to 2.9 cm with
transition point in the right lower quadrant possibly from a short
segment stricture or remotely adhesion given history of Crohn
disease and surgical changes seen in the right lower quadrant. No
bowel perforation, ascites or free air. No evidence of acute
inflammatory bowel.
2. Simple and complex bilateral renal cysts which could be further
correlated with MRI on a nonemergent basis for better
characterization.
3. Aortoiliac stent grafts as above. Right internal iliac stent
traversing a 2.2 cm aneurysm. Aneurysmal dilatation the left
internal iliac artery up to 3.5 cm with distal embolization coils
noted.
4. Cholecystectomy.

## 2020-07-02 DEATH — deceased
